# Patient Record
Sex: Female | Born: 1965
Health system: Southern US, Community
[De-identification: ages and names within clinical notes are randomized; demographics above are authoritative.]

## PROBLEM LIST (undated history)

## (undated) DIAGNOSIS — M549 Dorsalgia, unspecified: Secondary | ICD-10-CM

## (undated) DIAGNOSIS — C801 Malignant (primary) neoplasm, unspecified: Secondary | ICD-10-CM

## (undated) DIAGNOSIS — T7840XA Allergy, unspecified, initial encounter: Secondary | ICD-10-CM

## (undated) DIAGNOSIS — E785 Hyperlipidemia, unspecified: Secondary | ICD-10-CM

## (undated) DIAGNOSIS — S3992XA Unspecified injury of lower back, initial encounter: Secondary | ICD-10-CM

## (undated) DIAGNOSIS — K759 Inflammatory liver disease, unspecified: Secondary | ICD-10-CM

## (undated) DIAGNOSIS — F419 Anxiety disorder, unspecified: Secondary | ICD-10-CM

## (undated) DIAGNOSIS — E039 Hypothyroidism, unspecified: Secondary | ICD-10-CM

## (undated) DIAGNOSIS — Z87442 Personal history of urinary calculi: Secondary | ICD-10-CM

## (undated) DIAGNOSIS — E559 Vitamin D deficiency, unspecified: Secondary | ICD-10-CM

## (undated) DIAGNOSIS — K5900 Constipation, unspecified: Secondary | ICD-10-CM

## (undated) DIAGNOSIS — D649 Anemia, unspecified: Secondary | ICD-10-CM

## (undated) DIAGNOSIS — F329 Major depressive disorder, single episode, unspecified: Secondary | ICD-10-CM

## (undated) DIAGNOSIS — M1990 Unspecified osteoarthritis, unspecified site: Secondary | ICD-10-CM

## (undated) DIAGNOSIS — N979 Female infertility, unspecified: Secondary | ICD-10-CM

## (undated) DIAGNOSIS — M519 Unspecified thoracic, thoracolumbar and lumbosacral intervertebral disc disorder: Secondary | ICD-10-CM

## (undated) DIAGNOSIS — F32A Depression, unspecified: Secondary | ICD-10-CM

## (undated) DIAGNOSIS — K219 Gastro-esophageal reflux disease without esophagitis: Secondary | ICD-10-CM

## (undated) HISTORY — DX: Female infertility, unspecified: N97.9

## (undated) HISTORY — DX: Major depressive disorder, single episode, unspecified: F32.9

## (undated) HISTORY — DX: Vitamin D deficiency, unspecified: E55.9

## (undated) HISTORY — PX: DIAGNOSTIC LAPAROSCOPY: SUR761

## (undated) HISTORY — DX: Depression, unspecified: F32.A

## (undated) HISTORY — DX: Anxiety disorder, unspecified: F41.9

## (undated) HISTORY — DX: Dorsalgia, unspecified: M54.9

## (undated) HISTORY — PX: GANGLION CYST EXCISION: SHX1691

## (undated) HISTORY — DX: Hyperlipidemia, unspecified: E78.5

## (undated) HISTORY — DX: Allergy, unspecified, initial encounter: T78.40XA

## (undated) HISTORY — DX: Unspecified osteoarthritis, unspecified site: M19.90

## (undated) HISTORY — DX: Constipation, unspecified: K59.00

---

## 1985-10-17 HISTORY — PX: CERVICAL CONE BIOPSY: SUR198

## 1996-10-17 HISTORY — PX: LEEP: SHX91

## 1998-05-30 ENCOUNTER — Inpatient Hospital Stay (HOSPITAL_COMMUNITY): Admission: AD | Admit: 1998-05-30 | Discharge: 1998-05-30 | Payer: Self-pay | Admitting: Obstetrics and Gynecology

## 1999-02-08 ENCOUNTER — Ambulatory Visit (HOSPITAL_COMMUNITY): Admission: RE | Admit: 1999-02-08 | Discharge: 1999-02-08 | Payer: Self-pay | Admitting: Gastroenterology

## 1999-02-08 ENCOUNTER — Encounter: Payer: Self-pay | Admitting: Gastroenterology

## 2001-03-13 ENCOUNTER — Other Ambulatory Visit: Admission: RE | Admit: 2001-03-13 | Discharge: 2001-03-13 | Payer: Self-pay | Admitting: Obstetrics and Gynecology

## 2001-08-17 ENCOUNTER — Encounter: Payer: Self-pay | Admitting: Family Medicine

## 2001-08-17 ENCOUNTER — Ambulatory Visit (HOSPITAL_COMMUNITY): Admission: RE | Admit: 2001-08-17 | Discharge: 2001-08-17 | Payer: Self-pay | Admitting: Family Medicine

## 2002-10-17 DIAGNOSIS — K759 Inflammatory liver disease, unspecified: Secondary | ICD-10-CM

## 2002-10-17 HISTORY — DX: Inflammatory liver disease, unspecified: K75.9

## 2003-04-16 ENCOUNTER — Other Ambulatory Visit: Admission: RE | Admit: 2003-04-16 | Discharge: 2003-04-16 | Payer: Self-pay | Admitting: Obstetrics and Gynecology

## 2011-09-12 ENCOUNTER — Encounter: Payer: Self-pay | Admitting: *Deleted

## 2011-09-12 ENCOUNTER — Emergency Department (HOSPITAL_BASED_OUTPATIENT_CLINIC_OR_DEPARTMENT_OTHER)
Admission: EM | Admit: 2011-09-12 | Discharge: 2011-09-12 | Disposition: A | Discharge status: Home / Self Care | Payer: 59 | Attending: Emergency Medicine | Admitting: Emergency Medicine

## 2011-09-12 DIAGNOSIS — M5430 Sciatica, unspecified side: Secondary | ICD-10-CM | POA: Insufficient documentation

## 2011-09-12 DIAGNOSIS — M549 Dorsalgia, unspecified: Secondary | ICD-10-CM | POA: Insufficient documentation

## 2011-09-12 HISTORY — DX: Unspecified injury of lower back, initial encounter: S39.92XA

## 2011-09-12 MED ORDER — HYDROMORPHONE HCL PF 2 MG/ML IJ SOLN
2.0000 mg | Freq: Once | INTRAMUSCULAR | Status: AC
  Administered 2011-09-12: 2 mg via INTRAMUSCULAR
  Filled 2011-09-12: qty 1

## 2011-09-12 MED ORDER — METHYLPREDNISOLONE SODIUM SUCC 125 MG IJ SOLR
125.0000 mg | Freq: Once | INTRAMUSCULAR | Status: AC
  Administered 2011-09-12: 125 mg via INTRAMUSCULAR
  Filled 2011-09-12: qty 2

## 2011-09-12 MED ORDER — METHOCARBAMOL 500 MG PO TABS: 500.0000 mg | ORAL_TABLET | Freq: Two times a day (BID) | ORAL | Status: AC

## 2011-09-12 MED ORDER — ONDANSETRON HCL 4 MG/2ML IJ SOLN
4.0000 mg | Freq: Once | INTRAMUSCULAR | Status: AC
  Administered 2011-09-12: 4 mg via INTRAMUSCULAR
  Filled 2011-09-12: qty 2

## 2011-09-12 MED ORDER — OXYCODONE-ACETAMINOPHEN 5-325 MG PO TABS: 2.0000 | ORAL_TABLET | ORAL | Status: AC | PRN

## 2011-09-12 NOTE — ED Notes (Signed)
Pt reports lower back pain after acupuncture. No neuro def.

## 2011-09-12 NOTE — ED Provider Notes (Signed)
Medical screening examination/treatment/procedure(s) were performed by non-physician practitioner and as supervising physician I was immediately available for consultation/collaboration.   Kru Allman E Zauria Dombek, MD 09/12/11 2206 

## 2011-09-12 NOTE — ED Provider Notes (Signed)
History     CSN: [id number] Arrival date & time: 09/12/2011 11:43 AM   First MD Initiated Contact with Patient 09/12/11 1238      Chief Complaint  Patient presents with  . Back Pain    (Consider location/radiation/quality/duration/timing/severity/associated sxs/prior treatment) Patient is a 45 y.o. female presenting with back pain. The history is provided by the patient. No language interpreter was used.  Back Pain  This is a recurrent problem. The current episode started more than 2 days ago. The problem occurs constantly. The problem has been gradually worsening. The pain is associated with no known injury. The pain is present in the lumbar spine and gluteal region. The quality of the pain is described as stabbing and shooting. The pain radiates to the left thigh. The pain is at a severity of 7/10. The pain is severe. The symptoms are aggravated by certain positions. The pain is the same all the time. Stiffness is present all day. Pertinent negatives include no chest pain and no fever. She has tried ice and bed rest for the symptoms. The treatment provided no relief.  Pt has a history of back pain.  Pt is followed by Dr. Ethelene Hal.  Pt reports trouble getting out of bed today due to pain.  Past Medical History  Diagnosis Date  . Hypotension   . Back injury     No past surgical history on file.  No family history on file.  History  Substance Use Topics  . Smoking status: Not on file  . Smokeless tobacco: Not on file  . Alcohol Use: No    OB History    Grav Para Term Preterm Abortions TAB SAB Ect Mult Living                  Review of Systems  Constitutional: Negative for fever.  Cardiovascular: Negative for chest pain.  Musculoskeletal: Positive for back pain.  All other systems reviewed and are negative.    Allergies  Sulfa antibiotics  Home Medications   Current Outpatient Rx  Name Route Sig Dispense Refill  . CLONAZEPAM 0.5 MG PO TABS Oral Take 0.5 mg by  mouth 2 (two) times daily as needed.      . SERTRALINE HCL 100 MG PO TABS Oral Take 100 mg by mouth daily.      Marland Kitchen SIMVASTATIN 40 MG PO TABS Oral Take 40 mg by mouth at bedtime.        BP 128/76  Pulse 74  Temp(Src) 98.3 F (36.8 C) (Oral)  Resp 18  Ht 5\' 7"  (1.702 m)  Wt 195 lb (88.451 kg)  BMI 30.54 kg/m2  SpO2 100%  Physical Exam  Nursing note and vitals reviewed. Constitutional: She is oriented to person, place, and time. She appears well-developed and well-nourished.  HENT:  Head: Normocephalic and atraumatic.  Right Ear: External ear normal.  Mouth/Throat: Oropharynx is clear and moist.  Eyes: Conjunctivae and EOM are normal. Pupils are equal, round, and reactive to light.  Neck: Normal range of motion. Neck supple.  Cardiovascular: Normal rate and regular rhythm.   Pulmonary/Chest: Effort normal and breath sounds normal.  Abdominal: Soft. Bowel sounds are normal.  Musculoskeletal: Normal range of motion.  Neurological: She is alert and oriented to person, place, and time. She has normal reflexes.  Skin: Skin is warm and dry.  Psychiatric: She has a normal mood and affect.    ED Course  Procedures (including critical care time)  Labs Reviewed - No data to  display No results found.   No diagnosis found.    MDM   Pt counseled on followup.  Husband called Orthopaedist and they will see pt on Saturday. I will treat with solumedrol.  Pt given rx for percocet.        Langston Masker, [place] 09/12/11 1305  Langston Masker, [place] 09/12/11 1309  [place], [place] 09/12/11 1311

## 2011-09-12 NOTE — ED Notes (Signed)
Pt c/o cough and body aches "several days and it hurts my back worse when I cough". Pt sts low back pain is now bilateral and pain radiates down left leg.

## 2011-10-04 ENCOUNTER — Other Ambulatory Visit (HOSPITAL_COMMUNITY): Payer: Self-pay | Admitting: Physical Medicine and Rehabilitation

## 2011-10-04 DIAGNOSIS — M545 Low back pain: Secondary | ICD-10-CM

## 2011-10-06 ENCOUNTER — Ambulatory Visit (HOSPITAL_COMMUNITY)
Admission: RE | Admit: 2011-10-06 | Discharge: 2011-10-06 | Disposition: A | Discharge status: Home / Self Care | Payer: 59 | Source: Ambulatory Visit | Attending: Physical Medicine and Rehabilitation | Admitting: Physical Medicine and Rehabilitation

## 2011-10-06 DIAGNOSIS — M51379 Other intervertebral disc degeneration, lumbosacral region without mention of lumbar back pain or lower extremity pain: Secondary | ICD-10-CM | POA: Insufficient documentation

## 2011-10-06 DIAGNOSIS — M545 Low back pain: Secondary | ICD-10-CM

## 2011-10-06 DIAGNOSIS — M5126 Other intervertebral disc displacement, lumbar region: Secondary | ICD-10-CM | POA: Insufficient documentation

## 2011-10-06 DIAGNOSIS — M5137 Other intervertebral disc degeneration, lumbosacral region: Secondary | ICD-10-CM | POA: Insufficient documentation

## 2011-10-25 ENCOUNTER — Other Ambulatory Visit: Payer: Self-pay | Admitting: Neurosurgery

## 2011-10-26 ENCOUNTER — Encounter (HOSPITAL_COMMUNITY): Payer: Self-pay | Admitting: Pharmacy Technician

## 2011-11-01 ENCOUNTER — Encounter (HOSPITAL_COMMUNITY)
Admission: RE | Admit: 2011-11-01 | Discharge: 2011-11-01 | Disposition: A | Discharge status: Home / Self Care | Payer: 59 | Source: Ambulatory Visit | Attending: Neurosurgery | Admitting: Neurosurgery

## 2011-11-01 ENCOUNTER — Encounter (HOSPITAL_COMMUNITY): Payer: Self-pay

## 2011-11-01 HISTORY — DX: Unspecified thoracic, thoracolumbar and lumbosacral intervertebral disc disorder: M51.9

## 2011-11-01 LAB — CBC
Platelets: 203 10*3/uL (ref 150–400)
RBC: 4.56 MIL/uL (ref 3.87–5.11)
RDW: 14.6 % (ref 11.5–15.5)
WBC: 8.8 10*3/uL (ref 4.0–10.5)

## 2011-11-01 LAB — SURGICAL PCR SCREEN
MRSA, PCR: NEGATIVE
Staphylococcus aureus: NEGATIVE

## 2011-11-01 LAB — HCG, SERUM, QUALITATIVE: Preg, Serum: NEGATIVE

## 2011-11-01 NOTE — Pre-Procedure Instructions (Addendum)
19 Whitney Davenport  11/01/2011   Your procedure is scheduled on: JAN 17 Report to [facility] at 0845 AM.  Call this number if you have problems the morning of surgery: [phone]   Remember:   Do not eat food:After Midnight.  May have clear liquids: up to 4 Hours before arrival.  Clear liquids include soda, tea, black coffee, apple or grape juice, broth.  Take these medicines the morning of surgery with A SIP OF WATER: SERTRALINE ,CLONAZEPAM AS DESIRED.STOP ASPIRIN,COUMADIN ,EFFIENT,HERBAL MEDICATIONS Do not wear jewelry, make-up or nail polish.  Do not wear lotions, powders, or perfumes. You may wear deodorant.  Do not shave 48 hours prior to surgery.  Do not bring valuables to the hospital.  Contacts, dentures or bridgework may not be worn into surgery.  Leave suitcase in the car. After surgery it may be brought to your room.  For patients admitted to the hospital, checkout time is 11:00 AM the day of discharge.   Patients discharged the day of surgery will not be allowed to drive home.  Name and phone number of your driver: FAMILY  Special Instructions: CHG Shower Use Special Wash: 1/2 bottle night before surgery and 1/2 bottle morning of surgery.   Please read over the following fact sheets that you were given: Pain Booklet, MRSA Information and Surgical Site Infection Prevention

## 2011-11-02 MED ORDER — CEFAZOLIN SODIUM-DEXTROSE 2-3 GM-% IV SOLR
2.0000 g | INTRAVENOUS | Status: AC
  Administered 2011-11-03: 2 g via INTRAVENOUS
  Filled 2011-11-02: qty 50

## 2011-11-03 ENCOUNTER — Ambulatory Visit (HOSPITAL_COMMUNITY): Payer: 59 | Admitting: Certified Registered Nurse Anesthetist

## 2011-11-03 ENCOUNTER — Encounter (HOSPITAL_COMMUNITY): Payer: Self-pay | Admitting: Certified Registered Nurse Anesthetist

## 2011-11-03 ENCOUNTER — Ambulatory Visit (HOSPITAL_COMMUNITY)
Admission: RE | Admit: 2011-11-03 | Discharge: 2011-11-04 | Disposition: A | Discharge status: Home / Self Care | Payer: 59 | Source: Ambulatory Visit | Attending: Neurosurgery | Admitting: Neurosurgery

## 2011-11-03 ENCOUNTER — Encounter (HOSPITAL_COMMUNITY)
Admission: RE | Disposition: A | Discharge status: Home / Self Care | Payer: Self-pay | Source: Ambulatory Visit | Attending: Neurosurgery

## 2011-11-03 ENCOUNTER — Encounter (HOSPITAL_COMMUNITY): Payer: Self-pay | Admitting: *Deleted

## 2011-11-03 ENCOUNTER — Ambulatory Visit (HOSPITAL_COMMUNITY): Payer: 59

## 2011-11-03 DIAGNOSIS — M5126 Other intervertebral disc displacement, lumbar region: Secondary | ICD-10-CM | POA: Insufficient documentation

## 2011-11-03 DIAGNOSIS — M47817 Spondylosis without myelopathy or radiculopathy, lumbosacral region: Secondary | ICD-10-CM | POA: Insufficient documentation

## 2011-11-03 DIAGNOSIS — M51379 Other intervertebral disc degeneration, lumbosacral region without mention of lumbar back pain or lower extremity pain: Secondary | ICD-10-CM | POA: Insufficient documentation

## 2011-11-03 DIAGNOSIS — Z87442 Personal history of urinary calculi: Secondary | ICD-10-CM | POA: Insufficient documentation

## 2011-11-03 DIAGNOSIS — Z01812 Encounter for preprocedural laboratory examination: Secondary | ICD-10-CM | POA: Insufficient documentation

## 2011-11-03 DIAGNOSIS — N189 Chronic kidney disease, unspecified: Secondary | ICD-10-CM | POA: Insufficient documentation

## 2011-11-03 DIAGNOSIS — M5137 Other intervertebral disc degeneration, lumbosacral region: Secondary | ICD-10-CM | POA: Insufficient documentation

## 2011-11-03 HISTORY — PX: LUMBAR LAMINECTOMY/DECOMPRESSION MICRODISCECTOMY: SHX5026

## 2011-11-03 SURGERY — LUMBAR LAMINECTOMY/DECOMPRESSION MICRODISCECTOMY
Anesthesia: General | Site: Back | Laterality: Left | Wound class: Clean

## 2011-11-03 MED ORDER — ROCURONIUM BROMIDE 100 MG/10ML IV SOLN
INTRAVENOUS | Status: DC | PRN
  Administered 2011-11-03: 50 mg via INTRAVENOUS

## 2011-11-03 MED ORDER — CYCLOBENZAPRINE HCL 10 MG PO TABS
10.0000 mg | ORAL_TABLET | Freq: Three times a day (TID) | ORAL | Status: DC | PRN
  Administered 2011-11-03: 10 mg via ORAL
  Filled 2011-11-03: qty 1

## 2011-11-03 MED ORDER — KETOROLAC TROMETHAMINE 30 MG/ML IJ SOLN
30.0000 mg | Freq: Once | INTRAMUSCULAR | Status: AC
  Administered 2011-11-03: 30 mg via INTRAVENOUS

## 2011-11-03 MED ORDER — SODIUM CHLORIDE 0.9 % IJ SOLN
3.0000 mL | Freq: Two times a day (BID) | INTRAMUSCULAR | Status: DC
  Administered 2011-11-03 (×2): 3 mL via INTRAVENOUS

## 2011-11-03 MED ORDER — PHENOL 1.4 % MT LIQD: 1.0000 | OROMUCOSAL | Status: DC | PRN

## 2011-11-03 MED ORDER — HYDROXYZINE HCL 50 MG/ML IM SOLN: 50.0000 mg | INTRAMUSCULAR | Status: DC | PRN

## 2011-11-03 MED ORDER — OXYCODONE-ACETAMINOPHEN 5-325 MG PO TABS
1.0000 | ORAL_TABLET | ORAL | Status: DC | PRN
  Administered 2011-11-03 – 2011-11-04 (×4): 2 via ORAL
  Filled 2011-11-03 (×4): qty 2

## 2011-11-03 MED ORDER — GLYCOPYRROLATE 0.2 MG/ML IJ SOLN
INTRAMUSCULAR | Status: DC | PRN
  Administered 2011-11-03: .9 mg via INTRAVENOUS

## 2011-11-03 MED ORDER — SODIUM CHLORIDE 0.9 % IV SOLN
INTRAVENOUS | Status: AC
  Filled 2011-11-03: qty 500

## 2011-11-03 MED ORDER — HEMOSTATIC AGENTS (NO CHARGE) OPTIME
TOPICAL | Status: DC | PRN
  Administered 2011-11-03: 1 via TOPICAL

## 2011-11-03 MED ORDER — MEPERIDINE HCL 50 MG/ML IJ SOLN
INTRAMUSCULAR | Status: AC
  Filled 2011-11-03: qty 1

## 2011-11-03 MED ORDER — ALUM & MAG HYDROXIDE-SIMETH 200-200-20 MG/5ML PO SUSP: 30.0000 mL | Freq: Four times a day (QID) | ORAL | Status: DC | PRN

## 2011-11-03 MED ORDER — THROMBIN 5000 UNITS EX KIT
PACK | CUTANEOUS | Status: DC | PRN
  Administered 2011-11-03 (×2): 5000 [IU] via TOPICAL

## 2011-11-03 MED ORDER — FENTANYL CITRATE 0.05 MG/ML IJ SOLN
INTRAMUSCULAR | Status: AC
  Filled 2011-11-03: qty 2

## 2011-11-03 MED ORDER — SODIUM CHLORIDE 0.9 % IJ SOLN: 3.0000 mL | INTRAMUSCULAR | Status: DC | PRN

## 2011-11-03 MED ORDER — MIDAZOLAM HCL 5 MG/5ML IJ SOLN
INTRAMUSCULAR | Status: DC | PRN
  Administered 2011-11-03: 2 mg via INTRAVENOUS

## 2011-11-03 MED ORDER — ONDANSETRON HCL 4 MG/2ML IJ SOLN: 4.0000 mg | Freq: Four times a day (QID) | INTRAMUSCULAR | Status: DC | PRN

## 2011-11-03 MED ORDER — HYDROCODONE-ACETAMINOPHEN 5-325 MG PO TABS: 1.0000 | ORAL_TABLET | ORAL | Status: DC | PRN

## 2011-11-03 MED ORDER — NEOSTIGMINE METHYLSULFATE 1 MG/ML IJ SOLN
INTRAMUSCULAR | Status: DC | PRN
  Administered 2011-11-03: 5 mg via INTRAVENOUS

## 2011-11-03 MED ORDER — ACETAMINOPHEN 650 MG RE SUPP: 650.0000 mg | RECTAL | Status: DC | PRN

## 2011-11-03 MED ORDER — 0.9 % SODIUM CHLORIDE (POUR BTL) OPTIME
TOPICAL | Status: DC | PRN
  Administered 2011-11-03: 1000 mL

## 2011-11-03 MED ORDER — KCL IN DEXTROSE-NACL 20-5-0.45 MEQ/L-%-% IV SOLN
INTRAVENOUS | Status: DC
  Administered 2011-11-03: 17:00:00 via INTRAVENOUS
  Filled 2011-11-03 (×4): qty 1000

## 2011-11-03 MED ORDER — ACETAMINOPHEN 325 MG PO TABS: 650.0000 mg | ORAL_TABLET | ORAL | Status: DC | PRN

## 2011-11-03 MED ORDER — FENTANYL CITRATE 0.05 MG/ML IJ SOLN
INTRAMUSCULAR | Status: DC | PRN
  Administered 2011-11-03: 150 ug via INTRAVENOUS
  Administered 2011-11-03: 100 ug via INTRAVENOUS
  Administered 2011-11-03: 50 ug via INTRAVENOUS

## 2011-11-03 MED ORDER — ZOLPIDEM TARTRATE 5 MG PO TABS: 10.0000 mg | ORAL_TABLET | Freq: Every evening | ORAL | Status: DC | PRN

## 2011-11-03 MED ORDER — HYDROMORPHONE HCL PF 1 MG/ML IJ SOLN
INTRAMUSCULAR | Status: AC
  Filled 2011-11-03: qty 1

## 2011-11-03 MED ORDER — METHYLPREDNISOLONE ACETATE PF 80 MG/ML IJ SUSP
INTRAMUSCULAR | Status: DC | PRN
  Administered 2011-11-03: 80 mg

## 2011-11-03 MED ORDER — MEPERIDINE HCL 25 MG/ML IJ SOLN
INTRAMUSCULAR | Status: DC | PRN
Start: 2011-11-03 — End: 2011-11-03
  Administered 2011-11-03: 25 mg via INTRAVENOUS

## 2011-11-03 MED ORDER — LIDOCAINE-EPINEPHRINE 1 %-1:100000 IJ SOLN
INTRAMUSCULAR | Status: DC | PRN
  Administered 2011-11-03: 7.5 mL

## 2011-11-03 MED ORDER — MAGNESIUM HYDROXIDE 400 MG/5ML PO SUSP: 30.0000 mL | Freq: Every day | ORAL | Status: DC | PRN

## 2011-11-03 MED ORDER — ONDANSETRON HCL 4 MG/2ML IJ SOLN
INTRAMUSCULAR | Status: DC | PRN
  Administered 2011-11-03: 4 mg via INTRAVENOUS

## 2011-11-03 MED ORDER — BISACODYL 10 MG RE SUPP: 10.0000 mg | Freq: Every day | RECTAL | Status: DC | PRN

## 2011-11-03 MED ORDER — SODIUM CHLORIDE 0.9 % IV SOLN: 250.0000 mL | INTRAVENOUS | Status: DC

## 2011-11-03 MED ORDER — KETOROLAC TROMETHAMINE 30 MG/ML IJ SOLN
INTRAMUSCULAR | Status: AC
  Filled 2011-11-03: qty 1

## 2011-11-03 MED ORDER — KETOROLAC TROMETHAMINE 30 MG/ML IJ SOLN
30.0000 mg | Freq: Four times a day (QID) | INTRAMUSCULAR | Status: DC
  Administered 2011-11-03 – 2011-11-04 (×3): 30 mg via INTRAVENOUS
  Filled 2011-11-03 (×7): qty 1

## 2011-11-03 MED ORDER — HYDROXYZINE HCL 25 MG PO TABS: 50.0000 mg | ORAL_TABLET | ORAL | Status: DC | PRN

## 2011-11-03 MED ORDER — LACTATED RINGERS IV SOLN
INTRAVENOUS | Status: DC | PRN
  Administered 2011-11-03 (×2): via INTRAVENOUS

## 2011-11-03 MED ORDER — HYDROMORPHONE HCL PF 1 MG/ML IJ SOLN
0.2500 mg | INTRAMUSCULAR | Status: DC | PRN
  Administered 2011-11-03: 0.25 mg via INTRAVENOUS

## 2011-11-03 MED ORDER — PROPOFOL 10 MG/ML IV BOLUS
INTRAVENOUS | Status: DC | PRN
  Administered 2011-11-03: 180 mg via INTRAVENOUS

## 2011-11-03 MED ORDER — BUPIVACAINE HCL (PF) 0.5 % IJ SOLN
INTRAMUSCULAR | Status: DC | PRN
  Administered 2011-11-03: 7.5 mL

## 2011-11-03 MED ORDER — MENTHOL 3 MG MT LOZG: 1.0000 | LOZENGE | OROMUCOSAL | Status: DC | PRN

## 2011-11-03 MED ORDER — SODIUM CHLORIDE 0.9 % IR SOLN
Status: DC | PRN
  Administered 2011-11-03: 11:00:00

## 2011-11-03 MED ORDER — MORPHINE SULFATE 4 MG/ML IJ SOLN
4.0000 mg | INTRAMUSCULAR | Status: DC | PRN
  Administered 2011-11-03: 4 mg via INTRAMUSCULAR
  Filled 2011-11-03: qty 1

## 2011-11-03 MED ORDER — BACITRACIN 50000 UNITS IM SOLR
INTRAMUSCULAR | Status: AC
  Filled 2011-11-03: qty 1

## 2011-11-03 SURGICAL SUPPLY — 59 items
BAG DECANTER FOR FLEXI CONT (MISCELLANEOUS) ×2 IMPLANT
BENZOIN TINCTURE PRP APPL 2/3 (GAUZE/BANDAGES/DRESSINGS) IMPLANT
BLADE SURG ROTATE 9660 (MISCELLANEOUS) IMPLANT
BRUSH SCRUB EZ PLAIN DRY (MISCELLANEOUS) ×2 IMPLANT
BUR ACORN 6.0 ACORN (BURR) IMPLANT
BUR ACRON 5.0MM COATED (BURR) IMPLANT
BUR MATCHSTICK NEURO 3.0 LAGG (BURR) ×2 IMPLANT
CANISTER SUCTION 2500CC (MISCELLANEOUS) ×2 IMPLANT
CLOTH BEACON ORANGE TIMEOUT ST (SAFETY) ×2 IMPLANT
CONT SPEC 4OZ CLIKSEAL STRL BL (MISCELLANEOUS) IMPLANT
DERMABOND ADVANCED (GAUZE/BANDAGES/DRESSINGS) ×2
DERMABOND ADVANCED .7 DNX12 (GAUZE/BANDAGES/DRESSINGS) ×2 IMPLANT
DRAPE LAPAROTOMY 100X72X124 (DRAPES) ×2 IMPLANT
DRAPE MICROSCOPE LEICA (MISCELLANEOUS) ×2 IMPLANT
DRAPE POUCH INSTRU U-SHP 10X18 (DRAPES) ×2 IMPLANT
DRSG EMULSION OIL 3X3 NADH (GAUZE/BANDAGES/DRESSINGS) IMPLANT
ELECT REM PT RETURN 9FT ADLT (ELECTROSURGICAL) ×2
ELECTRODE REM PT RTRN 9FT ADLT (ELECTROSURGICAL) ×1 IMPLANT
GAUZE SPONGE 4X4 16PLY XRAY LF (GAUZE/BANDAGES/DRESSINGS) IMPLANT
GLOVE BIOGEL PI IND STRL 6.5 (GLOVE) ×1 IMPLANT
GLOVE BIOGEL PI IND STRL 7.0 (GLOVE) ×1 IMPLANT
GLOVE BIOGEL PI IND STRL 8 (GLOVE) ×1 IMPLANT
GLOVE BIOGEL PI INDICATOR 6.5 (GLOVE) ×1
GLOVE BIOGEL PI INDICATOR 7.0 (GLOVE) ×1
GLOVE BIOGEL PI INDICATOR 8 (GLOVE) ×1
GLOVE ECLIPSE 7.5 STRL STRAW (GLOVE) ×4 IMPLANT
GLOVE EXAM NITRILE LRG STRL (GLOVE) IMPLANT
GLOVE EXAM NITRILE MD LF STRL (GLOVE) ×2 IMPLANT
GLOVE EXAM NITRILE XL STR (GLOVE) IMPLANT
GLOVE EXAM NITRILE XS STR PU (GLOVE) IMPLANT
GLOVE SURG SS PI 6.5 STRL IVOR (GLOVE) ×4 IMPLANT
GLOVE SURG SS PI 7.5 STRL IVOR (GLOVE) ×4 IMPLANT
GOWN BRE IMP SLV AUR LG STRL (GOWN DISPOSABLE) ×6 IMPLANT
GOWN BRE IMP SLV AUR XL STRL (GOWN DISPOSABLE) ×2 IMPLANT
GOWN STRL REIN 2XL LVL4 (GOWN DISPOSABLE) IMPLANT
KIT BASIN OR (CUSTOM PROCEDURE TRAY) ×2 IMPLANT
KIT ROOM TURNOVER OR (KITS) ×2 IMPLANT
NEEDLE HYPO 18GX1.5 BLUNT FILL (NEEDLE) IMPLANT
NEEDLE SPNL 18GX3.5 QUINCKE PK (NEEDLE) ×2 IMPLANT
NEEDLE SPNL 22GX3.5 QUINCKE BK (NEEDLE) ×2 IMPLANT
NS IRRIG 1000ML POUR BTL (IV SOLUTION) ×2 IMPLANT
PACK LAMINECTOMY NEURO (CUSTOM PROCEDURE TRAY) ×2 IMPLANT
PAD ARMBOARD 7.5X6 YLW CONV (MISCELLANEOUS) ×6 IMPLANT
PATTIES SURGICAL .5 X1 (DISPOSABLE) IMPLANT
RUBBERBAND STERILE (MISCELLANEOUS) ×4 IMPLANT
SPONGE GAUZE 4X4 12PLY (GAUZE/BANDAGES/DRESSINGS) IMPLANT
SPONGE LAP 4X18 X RAY DECT (DISPOSABLE) IMPLANT
SPONGE SURGIFOAM ABS GEL SZ50 (HEMOSTASIS) ×2 IMPLANT
STRIP CLOSURE SKIN 1/2X4 (GAUZE/BANDAGES/DRESSINGS) IMPLANT
SUT PROLENE 6 0 BV (SUTURE) IMPLANT
SUT VIC AB 1 CT1 18XBRD ANBCTR (SUTURE) ×1 IMPLANT
SUT VIC AB 1 CT1 8-18 (SUTURE) ×1
SUT VIC AB 2-0 CP2 18 (SUTURE) ×2 IMPLANT
SUT VIC AB 3-0 SH 8-18 (SUTURE) IMPLANT
SYR 20CC LL (SYRINGE) ×2 IMPLANT
SYR 5ML LL (SYRINGE) IMPLANT
TOWEL OR 17X24 6PK STRL BLUE (TOWEL DISPOSABLE) ×2 IMPLANT
TOWEL OR 17X26 10 PK STRL BLUE (TOWEL DISPOSABLE) ×2 IMPLANT
WATER STERILE IRR 1000ML POUR (IV SOLUTION) ×2 IMPLANT

## 2011-11-03 NOTE — H&P (Signed)
Subjective: Patient is a 46 y.o. female who is admitted for treatment of left lumbar radiculopathy secondary to left L5-S1 lumbar disc herniation.patient explains her symptoms began last spring with pain for the low back down to the left buttock posterior thigh and into the calf and foot with numbness and tingling in a similar distribution. She's been treated with a posterior injections I am steroids and narcotic analgesics but without any lasting relief.  MRI scan reveals a large left L5-S1 lumbar disc herniation superimposed upon degenerative disease and spondylosis.she is admitted now for a left L5-S1 lumbar laminotomy and microdiscectomy.   Past Medical History  Diagnosis Date  . Hypotension   . Back injury   . Chronic kidney disease   . Kidney stone   . Lumbar disc disease     Past Surgical History  Procedure Date  . Diagnostic laparoscopy     for endometriosos  . Ganglion cyst excision     Prescriptions prior to admission  Medication Sig Dispense Refill  . clonazePAM (KLONOPIN) 0.5 MG tablet Take 0.5 mg by mouth 2 (two) times daily as needed.       . sertraline (ZOLOFT) 100 MG tablet Take 100 mg by mouth daily. Take 1 1/2 tablets daily      . simvastatin (ZOCOR) 40 MG tablet Take 40 mg by mouth at bedtime.        Allergies  Allergen Reactions  . Sulfa Antibiotics Nausea And Vomiting and Other (See Comments)    Causes headache with nausea  . Zithromax (Azithromycin) Itching and Rash    History  Substance Use Topics  . Smoking status: Former Smoker -- 15 years    Types: Cigarettes    Quit date: 10/31/2010  . Smokeless tobacco: Not on file  . Alcohol Use: No    History reviewed. No pertinent family history.   Review of Systems A comprehensive review of systems was negative.  Objective: Vital signs in last 24 hours: Temp:  [97.9 F (36.6 C)] 97.9 F (36.6 C) (01/17 0838) Pulse Rate:  [94] 94  (01/17 0838) Resp:  [20] 20  (01/17 0838) BP: (111)/(79) 111/79 mmHg  (01/17 0838) SpO2:  [98 %] 98 % (01/17 0838)  EXAM:  Lungs are clear to auscultation , the patient has symmetrical respiratory excursion. Heart has a regular rate and rhythm normal S1 and S2 no murmur.   Abdomen is soft nontender nondistended bowel sounds are present. Extremity examination shows no clubbing cyanosis or edema. Musculoskeletal examination shows no tenderness to palpation over the lumbar spinous processes or paralumbar musculature. However she has significant limitation of forward flexion at 45, limited by pain. She is able to extend to 10. Straight leg raising is positive on the left at 60. Straight leg raising is negative on the right.  Motor examination shows 5 over 5 strength in the lower extremities including the iliopsoas quadriceps dorsiflexor extensor hallicus  longus and plantar flexor bilaterally. Sensation is intact to pinprick in the distal lower extremities. Reflexes are symmetrical bilaterally at the quadriceps, but the left gastrocnemius is minimal and the right gastrocnemius is 2.. No pathologic reflexes are present. Patient has a normal gait and stance.   Data Review:CBC    Component Value Date/Time   WBC 8.8 11/01/2011 1412   RBC 4.56 11/01/2011 1412   HGB 12.7 11/01/2011 1412   HCT 39.8 11/01/2011 1412   PLT 203 11/01/2011 1412   MCV 87.3 11/01/2011 1412   MCH 27.9 11/01/2011 1412   MCHC  31.9 11/01/2011 1412   RDW 14.6 11/01/2011 1412    Assessment/Plan: Left L5-S1 lumbar disc herniation superimposed upon degenerative disc disease and spondylosis, with resulting left lumbar radiculopathy.patient is admitted for a left L5-S1 lumbar laminotomy and microdiscectomy. I've discussed with the patient the nature of his condition, the nature the surgical procedure, the typical length of surgery, hospital stay, and overall recuperation. We discussed limitations postoperatively. I discussed risks of surgery including risks of infection, bleeding, possibly need for transfusion,  the risk of nerve root dysfunction with pain, weakness, numbness, or paresthesias, or risk of dural tear and CSF leakage and possible need for further surgery, the risk of recurrent disc herniation and the possible need for further surgery, and the risk of anesthetic complications including myocardial infarction, stroke, pneumonia, and death. Understanding all this the patient does wish to proceed with surgery and is admitted for such.    Hewitt Shorts, MD 11/03/2011 9:44 AM

## 2011-11-03 NOTE — Anesthesia Postprocedure Evaluation (Signed)
Anesthesia Post Note  Patient: Whitney Davenport  Procedure(s) Performed:  LUMBAR LAMINECTOMY/DECOMPRESSION MICRODISCECTOMY - LEFT Lumbar five sacral one laminotomy and microdiskectomy  Anesthesia type: General  Patient location: PACU  Post pain: Pain level controlled and Adequate analgesia  Post assessment: Post-op Vital signs reviewed, Patient's Cardiovascular Status Stable, Respiratory Function Stable, Patent Airway and Pain level controlled  Last Vitals:  Filed Vitals:   11/03/11 1245  BP: 115/66  Pulse: 71  Temp:   Resp: 10    Post vital signs: Reviewed and stable  Level of consciousness: awake, alert  and oriented  Complications: No apparent anesthesia complications

## 2011-11-03 NOTE — Op Note (Signed)
11/03/2011  12:10 PM  PATIENT:  Whitney Davenport  47 y.o. female  PRE-OPERATIVE DIAGNOSIS:  Left lumbar five sacral one herniated disc, lumbar degerative disc disease, lumbar spondylosis, lumbar radiculopathy  POST-OPERATIVE DIAGNOSIS:  Left lumbar five sacral one herniated disc, lumbar degerative disc disease, lumbar spondylosis, lumbar radiculopathy  PROCEDURE:  Procedure(s): LUMBAR LAMINECTOMY/DECOMPRESSION MICRODISCECTOMY, left L5-S1 lumbar laminotomy and microdiscectomy with microdissection, microsurgical technique, and the operating microscope  SURGEON:  Surgeon(s): Hewitt Shorts, MD Clydene Fake, MD  ASSISTANTS: Clydene Fake, M.D.  ANESTHESIA:   general  EBL:  Total I/O In: 1000 [I.V.:1000] Out: -   BLOOD ADMINISTERED:none  COUNT: Correct per nursing staff about the  DRAINS: none   SPECIMEN:  No Specimen  DICTATION: Patient was brought to the operating room and placed under general endotracheal anesthesia. Patient was turned to prone position the lumbar region was prepped with Betadine soap and solution and draped in a sterile fashion. The midline was infiltrated with local anesthetic with epinephrine. A localizing x-ray was taken and the L5-S1 level was identified. Midline incision was made over the L5-S1 level and was carried down through the subcutaneous tissue to the lumbar fascia. The lumbar fascia was incised on the left side and the paraspinal muscles were dissected from the spinous processes and lamina in a subperiosteal fashion. Another x-ray was taken and the left L5-S1 intralaminar space was identified. Laminotomy was performed using the high-speed drill and Kerrison punches. The ligamentum flavum was carefully resected. The underlying thecal sac and nerve root were identified. The disc herniation was identified and the thecal sac and nerve root gently retracted medially. We incised the remaining annular fibers and removed several large fragments that had  herniated, and then entered the disc space and proceeded with a thorough discectomy, using a variety of pituitary rongeurs and micro-curettes. All loose fragments of disc material were removed from the epidural space and the disc space. Once the discectomy was completed and good decompression of the thecal sac and nerve had been achieved hemostasis was established with the use of bipolar cautery and Gelfoam with thrombin. The Gelfoam was removed and hemostasis confirmed. We then instilled 2 cc of fentanyl and 80 mg of Depo-Medrol into the epidural space. Deep fascia was closed with interrupted undyed 1 Vicryl sutures. Scarpa's fascia was closed with interrupted undyed 1 Vicryl sutures in the subcutaneous and subcuticular layer were closed with interrupted inverted 2-0 undyed Vicryl sutures. The skin edges were approximated with Dermabond. Following surgery the patient was turned back to a supine position to be reversed from the anesthetic extubated and transferred to the recovery room for further care.  PLAN OF CARE: Admit to inpatient   PATIENT DISPOSITION:  PACU - hemodynamically stable.   Delay start of Pharmacological VTE agent (>24hrs) due to surgical blood loss or risk of bleeding:  yes

## 2011-11-03 NOTE — Anesthesia Procedure Notes (Signed)
Procedure Name: Intubation Date/Time: 11/03/2011 10:40 AM Performed by: Delbert Harness Pre-anesthesia Checklist: Patient identified, Timeout performed, Emergency Drugs available, Suction available and Patient being monitored Patient Re-evaluated:Patient Re-evaluated prior to inductionOxygen Delivery Method: Circle System Utilized Preoxygenation: Pre-oxygenation with 100% oxygen Intubation Type: IV induction Ventilation: Mask ventilation without difficulty Laryngoscope Size: Mac and 3 Grade View: Grade I Tube type: Oral Tube size: 7.5 mm Number of attempts: 1 Airway Equipment and Method: stylet Placement Confirmation: ETT inserted through vocal cords under direct vision,  positive ETCO2 and breath sounds checked- equal and bilateral Secured at: 22 cm Tube secured with: Tape Dental Injury: Teeth and Oropharynx as per pre-operative assessment

## 2011-11-03 NOTE — Anesthesia Preprocedure Evaluation (Addendum)
Anesthesia Evaluation  Patient identified by MRN, date of birth, ID band Patient awake    Reviewed: Allergy & Precautions, H&P , NPO status , Patient's Chart, lab work & pertinent test results  Airway Mallampati: II TM Distance: >3 FB Neck ROM: full    Dental  (+) Teeth Intact and Dental Advidsory Given   Pulmonary neg pulmonary ROS,    Pulmonary exam normal       Cardiovascular neg cardio ROS     Neuro/Psych    GI/Hepatic negative GI ROS, Neg liver ROS,   Endo/Other  Negative Endocrine ROS  Renal/GU negative Renal ROS     Musculoskeletal   Abdominal Normal abdominal exam  (+)   Peds  Hematology negative hematology ROS (+)   Anesthesia Other Findings   Reproductive/Obstetrics negative OB ROS                          Anesthesia Physical Anesthesia Plan  ASA: II  Anesthesia Plan: General   Post-op Pain Management:    Induction: Intravenous  Airway Management Planned: Oral ETT  Additional Equipment:   Intra-op Plan:   Post-operative Plan: Extubation in OR  Informed Consent: I have reviewed the patients History and Physical, chart, labs and discussed the procedure including the risks, benefits and alternatives for the proposed anesthesia with the patient or authorized representative who has indicated his/her understanding and acceptance.     Plan Discussed with: CRNA and Surgeon  Anesthesia Plan Comments:         Anesthesia Quick Evaluation

## 2011-11-03 NOTE — Progress Notes (Signed)
Filed Vitals:   11/03/11 1415 11/03/11 1430 11/03/11 1516 11/03/11 1728  BP: 102/59 106/55 107/75 129/72  Pulse: 68 70 63 66  Temp:  98.4 F (36.9 C) 98.3 F (36.8 C) 97.7 F (36.5 C)  TempSrc:   Oral Oral  Resp: 12 6 18 20   SpO2: 94% 93% 90% 99%    CBC  Basename 11/01/11 1412  WBC 8.8  HGB 12.7  HCT 39.8  PLT 203    Patient resting comfortably in bed, mild incisional discomfort, excellent relief of lumbar radicular pain. Patient has voided. Patient is ambulate in the halls. Wound is clean and dry.  Plan: Encouraged to ambulate several times this evening.

## 2011-11-03 NOTE — Transfer of Care (Signed)
Immediate Anesthesia Transfer of Care Note  Patient: Whitney Davenport  Procedure(s) Performed:  LUMBAR LAMINECTOMY/DECOMPRESSION MICRODISCECTOMY - LEFT Lumbar five sacral one laminotomy and microdiskectomy  Patient Location: PACU  Anesthesia Type: General  Level of Consciousness: awake, alert  and oriented  Airway & Oxygen Therapy: Patient Spontanous Breathing and Patient connected to nasal cannula oxygen  Post-op Assessment: Report given to PACU RN, Post -op Vital signs reviewed and stable, Patient moving all extremities X 4 and Patient able to stick tongue midline  Post vital signs: Reviewed and stable Filed Vitals:   11/03/11 1230  BP:   Pulse: 87  Temp:   Resp: 11    Complications: No apparent anesthesia complications

## 2011-11-03 NOTE — Preoperative (Signed)
Beta Blockers   Reason not to administer Beta Blockers:Not Applicable 

## 2011-11-04 MED ORDER — OXYCODONE-ACETAMINOPHEN 5-325 MG PO TABS: 1.0000 | ORAL_TABLET | ORAL | Status: AC | PRN

## 2011-11-04 MED ORDER — CYCLOBENZAPRINE HCL 10 MG PO TABS: 10.0000 mg | ORAL_TABLET | Freq: Three times a day (TID) | ORAL | Status: AC | PRN

## 2011-11-04 NOTE — Discharge Summary (Signed)
Physician Discharge Summary  Patient ID: Whitney Davenport MRN: [id number] DOB/AGE: August 03, 1966 46 y.o.  Admit date: 11/03/2011 Discharge date: 11/04/2011  Admission Diagnoses:Left lumbar five sacral one herniated disc, lumbar degerative disc disease, lumbar spondylosis, lumbar radiculopathy  Discharge Diagnoses: Left lumbar five sacral one herniated disc, lumbar degerative disc disease, lumbar spondylosis, lumbar radiculopathy  Discharged Condition: good  Hospital Course: Patient was admitted underwent a left L5-S1 lumbar laminotomy and microdiscectomy. Postoperatively she has had excellent relief of her radicular pain. She been up and ambulate actively in the halls. The wound is clean and dry. She's been given instructions regarding wound care and activities following discharge. She is to return for followup with me in the office in 3 weeks  Discharge Exam: Blood pressure 102/63, pulse 70, temperature 98 F (36.7 C), temperature source Oral, resp. rate 18, SpO2 93.00%.  Disposition: Home   Current Discharge Medication List    START taking these medications   Details  cyclobenzaprine (FLEXERIL) 10 MG tablet Take 1 tablet (10 mg total) by mouth 3 (three) times daily as needed for muscle spasms. Qty: 30 tablet, Refills: 1    oxyCODONE-acetaminophen (PERCOCET) 5-325 MG per tablet Take 1-2 tablets by mouth every 4 (four) hours as needed. Qty: 50 tablet, Refills: 0      CONTINUE these medications which have NOT CHANGED   Details  clonazePAM (KLONOPIN) 0.5 MG tablet Take 0.5 mg by mouth 2 (two) times daily as needed.     sertraline (ZOLOFT) 100 MG tablet Take 100 mg by mouth daily. Take 1 1/2 tablets daily    simvastatin (ZOCOR) 40 MG tablet Take 40 mg by mouth at bedtime.          Signed: Hewitt Shorts, MD 11/04/2011, 8:16 AM

## 2011-11-08 ENCOUNTER — Encounter (HOSPITAL_COMMUNITY): Payer: Self-pay | Admitting: Neurosurgery

## 2014-04-25 ENCOUNTER — Other Ambulatory Visit (HOSPITAL_BASED_OUTPATIENT_CLINIC_OR_DEPARTMENT_OTHER): Payer: Self-pay | Admitting: Physical Medicine and Rehabilitation

## 2014-04-25 DIAGNOSIS — M545 Low back pain: Secondary | ICD-10-CM

## 2014-04-26 ENCOUNTER — Ambulatory Visit (HOSPITAL_BASED_OUTPATIENT_CLINIC_OR_DEPARTMENT_OTHER)
Admission: RE | Admit: 2014-04-26 | Discharge: 2014-04-26 | Disposition: A | Discharge status: Home / Self Care | Payer: 59 | Source: Ambulatory Visit | Attending: Physical Medicine and Rehabilitation | Admitting: Physical Medicine and Rehabilitation

## 2014-04-26 DIAGNOSIS — M79609 Pain in unspecified limb: Secondary | ICD-10-CM | POA: Insufficient documentation

## 2014-04-26 DIAGNOSIS — M545 Low back pain, unspecified: Secondary | ICD-10-CM | POA: Insufficient documentation

## 2014-04-26 DIAGNOSIS — M5126 Other intervertebral disc displacement, lumbar region: Secondary | ICD-10-CM | POA: Insufficient documentation

## 2014-04-26 MED ORDER — GADOBENATE DIMEGLUMINE 529 MG/ML IV SOLN: 18.0000 mL | Freq: Once | INTRAVENOUS | Status: AC | PRN

## 2014-10-24 ENCOUNTER — Ambulatory Visit (INDEPENDENT_AMBULATORY_CARE_PROVIDER_SITE_OTHER): Payer: 59 | Admitting: Family Medicine

## 2014-10-24 VITALS — BP 126/82 | HR 91 | Temp 98.2°F | Resp 17 | Ht 67.0 in | Wt 206.0 lb

## 2014-10-24 DIAGNOSIS — Z0000 Encounter for general adult medical examination without abnormal findings: Secondary | ICD-10-CM

## 2014-10-24 NOTE — Patient Instructions (Signed)
Get the TB skin test signed by your other physician  Return if problems arise  [employer], Johney [place]

## 2014-10-24 NOTE — Progress Notes (Signed)
Physical examination:  History: Patient is here for physical examination. She has no major acute medical complaints. She does have a primary care doctor. 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 She is in school and [employer], and pre-social work type course.  Past medical history: Surgeries: None Medical illnesses: None except depression and anxiety and hyperlipidemia. Did have problems with infertility. Medications: Sertraline, clonazepam, and simvastatin Allergies: Sulfa and azithromycin  Family history: Parents are living. Some family history of diabetes.  Social history: Married. 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 She has one 69 year old adopted child.  Review of systems: Major of systems was unremarkable.  Physical examination: Well-developed well-nourished lady in no acute distress. HEENT: TMs normal. Eyes PERRLA. Fundi benign. Her throat is clear. Neck supple without nodes or thyromegaly. Chest is clear to auscultation. Heart regular without murmurs gallops or arrhythmias. Abdomen soft without mass or tenderness. Extremities unremarkable. Skin unremarkable. Pelvic and breast exam not done, gets those on regular basis.  Assessment: Normal annual physical examination  Plan: Forms completed. See copy.

## 2014-10-28 ENCOUNTER — Inpatient Hospital Stay: Admit: 2014-10-28 | Payer: Self-pay | Admitting: Orthopedic Surgery

## 2014-10-28 SURGERY — ARTHROPLASTY, HIP, TOTAL, ANTERIOR APPROACH
Anesthesia: Choice | Site: Hip | Laterality: Right

## 2015-01-27 ENCOUNTER — Encounter (HOSPITAL_COMMUNITY): Payer: Self-pay

## 2015-01-27 ENCOUNTER — Encounter (HOSPITAL_COMMUNITY)
Admission: RE | Admit: 2015-01-27 | Discharge: 2015-01-27 | Disposition: A | Discharge status: Home / Self Care | Payer: 59 | Source: Ambulatory Visit | Attending: Orthopedic Surgery | Admitting: Orthopedic Surgery

## 2015-01-27 DIAGNOSIS — Z01818 Encounter for other preprocedural examination: Secondary | ICD-10-CM | POA: Insufficient documentation

## 2015-01-27 HISTORY — DX: Anemia, unspecified: D64.9

## 2015-01-27 HISTORY — DX: Inflammatory liver disease, unspecified: K75.9

## 2015-01-27 HISTORY — DX: Personal history of urinary calculi: Z87.442

## 2015-01-27 HISTORY — DX: Gastro-esophageal reflux disease without esophagitis: K21.9

## 2015-01-27 LAB — SURGICAL PCR SCREEN
MRSA, PCR: NEGATIVE
Staphylococcus aureus: NEGATIVE

## 2015-01-27 LAB — CBC
HEMATOCRIT: 36.9 % (ref 36.0–46.0)
Hemoglobin: 11.4 g/dL — ABNORMAL LOW (ref 12.0–15.0)
MCH: 27.5 pg (ref 26.0–34.0)
MCHC: 30.9 g/dL (ref 30.0–36.0)
MCV: 88.9 fL (ref 78.0–100.0)
PLATELETS: 224 10*3/uL (ref 150–400)
RBC: 4.15 MIL/uL (ref 3.87–5.11)
RDW: 14.6 % (ref 11.5–15.5)
WBC: 6.8 10*3/uL (ref 4.0–10.5)

## 2015-01-27 LAB — COMPREHENSIVE METABOLIC PANEL
ALBUMIN: 4.2 g/dL (ref 3.5–5.2)
ALK PHOS: 72 U/L (ref 39–117)
ALT: 13 U/L (ref 0–35)
AST: 18 U/L (ref 0–37)
Anion gap: 8 (ref 5–15)
BILIRUBIN TOTAL: 0.2 mg/dL — AB (ref 0.3–1.2)
BUN: 12 mg/dL (ref 6–23)
CHLORIDE: 108 mmol/L (ref 96–112)
CO2: 28 mmol/L (ref 19–32)
Calcium: 8.9 mg/dL (ref 8.4–10.5)
Creatinine, Ser: 0.82 mg/dL (ref 0.50–1.10)
GFR calc Af Amer: >90 mL/min (ref >90)
GFR calc non Af Amer: 82 mL/min — ABNORMAL LOW (ref >90)
Glucose, Bld: 122 mg/dL — ABNORMAL HIGH (ref 70–99)
POTASSIUM: 4 mmol/L (ref 3.5–5.1)
SODIUM: 144 mmol/L (ref 135–145)
TOTAL PROTEIN: 7.9 g/dL (ref 6.0–8.3)

## 2015-01-27 LAB — URINALYSIS, ROUTINE W REFLEX MICROSCOPIC
Bilirubin Urine: NEGATIVE
GLUCOSE, UA: NEGATIVE mg/dL
Ketones, ur: NEGATIVE mg/dL
Nitrite: NEGATIVE
Protein, ur: NEGATIVE mg/dL
SPECIFIC GRAVITY, URINE: 1.013 (ref 1.005–1.030)
UROBILINOGEN UA: 0.2 mg/dL (ref 0.0–1.0)
pH: 6 (ref 5.0–8.0)

## 2015-01-27 LAB — URINE MICROSCOPIC-ADD ON

## 2015-01-27 LAB — PROTIME-INR
INR: 1.01 (ref 0.00–1.49)
PROTHROMBIN TIME: 13.4 s (ref 11.6–15.2)

## 2015-01-27 LAB — APTT: aPTT: 28 seconds (ref 24–37)

## 2015-01-27 LAB — HCG, SERUM, QUALITATIVE: Preg, Serum: NEGATIVE

## 2015-01-27 NOTE — Progress Notes (Signed)
Surgery clearance note 11/21/14 Dr. Moreen Fowler on chart, LOV note 11/18/14 Dr. Moreen Fowler on chart

## 2015-01-27 NOTE — Patient Instructions (Addendum)
Whitney Davenport  01/27/2015   Your procedure is scheduled on: Tuesday 02/03/15  Report to [facility] and follow signs to               [place] at 5:00AM.  Call this number if you have problems the morning of surgery [phone]   Remember:  Do not eat food or drink liquids :After Midnight.     Take these medicines the morning of surgery with A SIP OF WATER: klonopin if needed                                You may not have any metal on your body including hair pins and              piercings  Do not wear jewelry, make-up, lotions, powders or perfumes.             Do not wear nail polish.  Do not shave  48 hours prior to surgery.              Men may shave face and neck.  Do not bring valuables to the hospital. [place].  Contacts, dentures or bridgework may not be worn into surgery.  Leave suitcase in the car. After surgery it may be brought to your room.               Please read over the following fact sheets you were given: MRSA information  _____________________________________________________________________             [facility] - Preparing for Surgery Before surgery, you can play an important role.  Because skin is not sterile, your skin needs to be as free of germs as possible.  You can reduce the number of germs on your skin by washing with CHG (chlorahexidine gluconate) soap before surgery.  CHG is an antiseptic cleaner which kills germs and bonds with the skin to continue killing germs even after washing. Please DO NOT use if you have an allergy to CHG or antibacterial soaps.  If your skin becomes reddened/irritated stop using the CHG and inform your nurse when you arrive at Short Stay. Do not shave (including legs and underarms) for at least 48 hours prior to the first CHG shower.  You may shave your face/neck. Please follow these instructions carefully:  1.  Shower  with CHG Soap the night before surgery and the  morning of Surgery.  2.  If you choose to wash your hair, wash your hair first as usual with your  normal  shampoo.  3.  After you shampoo, rinse your hair and body thoroughly to remove the  shampoo.                            4.  Use CHG as you would any other liquid soap.  You can apply chg directly  to the skin and wash                       Gently with a scrungie or clean washcloth.  5.  Apply the CHG Soap to your body ONLY FROM THE NECK DOWN.  Do not use on face/ open                           Wound or open sores. Avoid contact with eyes, ears mouth and genitals (private parts).                       Wash face,  Genitals (private parts) with your normal soap.             6.  Wash thoroughly, paying special attention to the area where your surgery  will be performed.  7.  Thoroughly rinse your body with warm water from the neck down.  8.  DO NOT shower/wash with your normal soap after using and rinsing off  the CHG Soap.                9.  Pat yourself dry with a clean towel.            10.  Wear clean pajamas.            11.  Place clean sheets on your bed the night of your first shower and do not  sleep with pets. Day of Surgery : Do not apply any lotions/deodorants the morning of surgery.  Please wear clean clothes to the hospital/surgery center.  FAILURE TO FOLLOW THESE INSTRUCTIONS MAY RESULT IN THE CANCELLATION OF YOUR SURGERY PATIENT SIGNATURE_________________________________  NURSE SIGNATURE__________________________________  ________________________________________________________________________  WHAT IS A BLOOD TRANSFUSION? Blood Transfusion Information  A transfusion is the replacement of blood or some of its parts. Blood is made up of multiple cells which provide different functions.  Red blood cells carry oxygen and are used for blood loss replacement.  White blood cells fight against infection.  Platelets control  bleeding.  Plasma helps clot blood.  Other blood products are available for specialized needs, such as hemophilia or other clotting disorders. BEFORE THE TRANSFUSION  Who gives blood for transfusions?   Healthy volunteers who are fully evaluated to make sure their blood is safe. This is blood bank blood. Transfusion therapy is the safest it has ever been in the practice of medicine. Before blood is taken from a donor, a complete history is taken to make sure that person has no history of diseases nor engages in risky social behavior (examples are intravenous drug use or sexual activity with multiple partners). The donor's travel history is screened to minimize risk of transmitting infections, such as malaria. The donated blood is tested for signs of infectious diseases, such as HIV and hepatitis. The blood is then tested to be sure it is compatible with you in order to minimize the chance of a transfusion reaction. If you or a relative donates blood, this is often done in anticipation of surgery and is not appropriate for emergency situations. It takes many days to process the donated blood. RISKS AND COMPLICATIONS Although transfusion therapy is very safe and saves many lives, the main dangers of transfusion include:  1. Getting an infectious disease. 2. Developing a transfusion reaction. This is an allergic reaction to something in the blood you were given. Every precaution is taken to prevent this. The decision to have a blood transfusion has been considered carefully by your caregiver before blood is given. Blood is not given unless the benefits outweigh the risks. AFTER THE TRANSFUSION  Right after receiving a blood transfusion, you will usually feel much better and more energetic. This is especially  true if your red blood cells have gotten low (anemic). The transfusion raises the level of the red blood cells which carry oxygen, and this usually causes an energy increase.  The nurse  administering the transfusion will monitor you carefully for complications. HOME CARE INSTRUCTIONS  No special instructions are needed after a transfusion. You may find your energy is better. Speak with your caregiver about any limitations on activity for underlying diseases you may have. SEEK MEDICAL CARE IF:   Your condition is not improving after your transfusion.  You develop redness or irritation at the intravenous (IV) site. SEEK IMMEDIATE MEDICAL CARE IF:  Any of the following symptoms occur over the next 12 hours:  Shaking chills.  You have a temperature by mouth above 102 F (38.9 C), not controlled by medicine.  Chest, back, or muscle pain.  People around you feel you are not acting correctly or are confused.  Shortness of breath or difficulty breathing.  Dizziness and fainting.  You get a rash or develop hives.  You have a decrease in urine output.  Your urine turns a dark color or changes to pink, red, or brown. Any of the following symptoms occur over the next 10 days:  You have a temperature by mouth above 102 F (38.9 C), not controlled by medicine.  Shortness of breath.  Weakness after normal activity.  The white part of the eye turns yellow (jaundice).  You have a decrease in the amount of urine or are urinating less often.  Your urine turns a dark color or changes to pink, red, or brown. Document Released: 09/30/2000 Document Revised: 12/26/2011 Document Reviewed: 05/19/2008 ExitCare Patient Information 2014 [place].  _______________________________________________________________________  Incentive Spirometer  An incentive spirometer is a tool that can help keep your lungs clear and active. This tool measures how well you are filling your lungs with each breath. Taking long deep breaths may help reverse or decrease the chance of developing breathing (pulmonary) problems (especially infection) following:  A long period of time when you are  unable to move or be active. BEFORE THE PROCEDURE   If the spirometer includes an indicator to show your best effort, your nurse or respiratory therapist will set it to a desired goal.  If possible, sit up straight or lean slightly forward. Try not to slouch.  Hold the incentive spirometer in an upright position. INSTRUCTIONS FOR USE  3. Sit on the edge of your bed if possible, or sit up as far as you can in bed or on a chair. 4. Hold the incentive spirometer in an upright position. 5. Breathe out normally. 6. Place the mouthpiece in your mouth and seal your lips tightly around it. 7. Breathe in slowly and as deeply as possible, raising the piston or the ball toward the top of the column. 8. Hold your breath for 3-5 seconds or for as long as possible. Allow the piston or ball to fall to the bottom of the column. 9. Remove the mouthpiece from your mouth and breathe out normally. 10. Rest for a few seconds and repeat Steps 1 through 7 at least 10 times every 1-2 hours when you are awake. Take your time and take a few normal breaths between deep breaths. 11. The spirometer may include an indicator to show your best effort. Use the indicator as a goal to work toward during each repetition. 12. After each set of 10 deep breaths, practice coughing to be sure your lungs are clear. If you have  an incision (the cut made at the time of surgery), support your incision when coughing by placing a pillow or rolled up towels firmly against it. Once you are able to get out of bed, walk around indoors and cough well. You may stop using the incentive spirometer when instructed by your caregiver.  RISKS AND COMPLICATIONS  Take your time so you do not get dizzy or light-headed.  If you are in pain, you may need to take or ask for pain medication before doing incentive spirometry. It is harder to take a deep breath if you are having pain. AFTER USE  Rest and breathe slowly and easily.  It can be helpful to  keep track of a log of your progress. Your caregiver can provide you with a simple table to help with this. If you are using the spirometer at home, follow these instructions: [place] IF:   You are having difficultly using the spirometer.  You have trouble using the spirometer as often as instructed.  Your pain medication is not giving enough relief while using the spirometer.  You develop fever of 100.5 F (38.1 C) or higher. SEEK IMMEDIATE MEDICAL CARE IF:   You cough up bloody sputum that had not been present before.  You develop fever of 102 F (38.9 C) or greater.  You develop worsening pain at or near the incision site. MAKE SURE YOU:   Understand these instructions.  Will watch your condition.  Will get help right away if you are not doing well or get worse. Document Released: 02/13/2007 Document Revised: 12/26/2011 Document Reviewed: 04/16/2007 [facility] Patient Information 2014 [place], [place].   ________________________________________________________________________

## 2015-01-30 NOTE — H&P (Signed)
TOTAL HIP ADMISSION H&P  Patient is admitted for right total hip arthroplasty, anterior approach.  Subjective:  Chief Complaint:   Right hip primary OA / pain  HPI: Whitney Davenport, 49 y.o. female, has a history of pain and functional disability in the right hip(s) due to arthritis and patient has failed non-surgical conservative treatments for greater than 12 weeks to include NSAID's and/or analgesics, use of assistive devices and activity modification.  Onset of symptoms was gradual starting years ago with gradually worsening course since that time.The patient noted no past surgery on the right hip(s).  Patient currently rates pain in the right hip at 8 out of 10 with activity. Patient has worsening of pain with activity and weight bearing, trendelenberg gait, pain that interfers with activities of daily living and pain with passive range of motion. Patient has evidence of periarticular osteophytes and joint space narrowing by imaging studies. This condition presents safety issues increasing the risk of falls.  There is no current active infection.  Risks, benefits and expectations were discussed with the patient.  Risks including but not limited to the risk of anesthesia, blood clots, nerve damage, blood vessel damage, failure of the prosthesis, infection and up to and including death.  Patient understand the risks, benefits and expectations and wishes to proceed with surgery.   PCP: Gara Kroner, MD  D/C Plans:      Home with HHPT  Post-op Meds:       No Rx given   Tranexamic Acid:      To be given - IV   Decadron:      Is to be given  FYI:     ASA post-op  Oxycodone first ( if too strong then tramadol)    Past Medical History  Diagnosis Date  . Hypotension     no fainting in years  . Lumbar disc disease   . Allergy   . Anxiety   . Back injury     "ruptured disc-no problems since surgery"  . History of kidney stones   . GERD (gastroesophageal reflux disease)     occ  .  Anemia     hx of  . Hepatitis 2004    hx of hep c-"interferon treatment, does not show up on blood test anymore"    Past Surgical History  Procedure Laterality Date  . Ganglion cyst excision  age 50  . Lumbar laminectomy/decompression microdiscectomy  11/03/2011    Procedure: LUMBAR LAMINECTOMY/DECOMPRESSION MICRODISCECTOMY;  Surgeon: Hosie Spangle, MD;  Location: [facility];  Service: Neurosurgery;  Laterality: Left;  LEFT Lumbar five sacral one laminotomy and microdiskectomy  . Diagnostic laparoscopy  last 2001    for endometriosis, couple of surgeries    No prescriptions prior to admission   Allergies  Allergen Reactions  . Sulfa Antibiotics Nausea And Vomiting and Other (See Comments)    Causes headache with nausea  . Hydrocodone Itching and Rash  . Zithromax [Azithromycin] Itching and Rash    History  Substance Use Topics  . Smoking status: Former Smoker -- 15 years    Types: Cigarettes    Quit date: 10/31/2010  . Smokeless tobacco: Never Used  . Alcohol Use: Yes     Comment: rare    Family History  Problem Relation Age of Onset  . Diabetes Father   . Hyperlipidemia Father   . Cancer Maternal Grandmother      Review of Systems  Constitutional: Negative.   HENT: Negative.   Eyes:  Negative.   Respiratory: Negative.   Cardiovascular: Negative.   Gastrointestinal: Positive for heartburn.  Genitourinary: Negative.   Musculoskeletal: Positive for back pain and joint pain.  Skin: Negative.   Endo/Heme/Allergies: Positive for environmental allergies.  Psychiatric/Behavioral: The patient is nervous/anxious.     Objective:  Physical Exam  Constitutional: She is oriented to person, place, and time. She appears well-developed and well-nourished.  HENT:  Head: Normocephalic.  Eyes: Pupils are equal, round, and reactive to light.  Neck: Neck supple. No JVD present. No tracheal deviation present. No thyromegaly present.  Cardiovascular: Normal rate, regular  rhythm, normal heart sounds and intact distal pulses.   Respiratory: Effort normal and breath sounds normal. No stridor. No respiratory distress. She has no wheezes.  GI: Soft. There is no tenderness. There is no guarding.  Musculoskeletal:       Right hip: She exhibits decreased range of motion, decreased strength, tenderness and bony tenderness. She exhibits no swelling, no deformity and no laceration.  Lymphadenopathy:    She has no cervical adenopathy.  Neurological: She is alert and oriented to person, place, and time.  Skin: Skin is warm and dry.  Psychiatric: She has a normal mood and affect.      Labs:  Estimated body mass index is 32.26 kg/(m^2) as calculated from the following:   Height as of 10/24/14: 5\' 7"  (1.702 m).   Weight as of 10/24/14: 93.441 kg (206 lb).   Imaging Review Plain radiographs demonstrate severe degenerative joint disease of the right hip(s). The bone quality appears to be good for age and reported activity level.  Assessment/Plan:  End stage arthritis, right hip(s)  The patient history, physical examination, clinical judgement of the provider and imaging studies are consistent with end stage degenerative joint disease of the right hip(s) and total hip arthroplasty is deemed medically necessary. The treatment options including medical management, injection therapy, arthroscopy and arthroplasty were discussed at length. The risks and benefits of total hip arthroplasty were presented and reviewed. The risks due to aseptic loosening, infection, stiffness, dislocation/subluxation,  thromboembolic complications and other imponderables were discussed.  The patient acknowledged the explanation, agreed to proceed with the plan and consent was signed. Patient is being admitted for inpatient treatment for surgery, pain control, PT, OT, prophylactic antibiotics, VTE prophylaxis, progressive ambulation and ADL's and discharge planning.The patient is planning to be  discharged home with home health services.     [place] Pugh [facility]   PA-C  01/30/2015, 2:50 PM

## 2015-02-02 NOTE — Anesthesia Preprocedure Evaluation (Addendum)
Anesthesia Evaluation  Patient identified by MRN, date of birth, ID band Patient awake    Reviewed: Allergy & Precautions, NPO status , Patient's Chart, lab work & pertinent test results, reviewed documented beta blocker date and time   Airway Mallampati: II   Neck ROM: Full    Dental  (+) Teeth Intact, Dental Advisory Given   Pulmonary former smoker (quit 2012 15 pack year),  breath sounds clear to auscultation        Cardiovascular negative cardio ROS  Rhythm:Regular     Neuro/Psych Anxiety negative neurological ROS     GI/Hepatic GERD-  Medicated,(+) Hepatitis - (Rx'd, neg now), C  Endo/Other  negative endocrine ROS  Renal/GU negative Renal ROS     Musculoskeletal   Abdominal (+)  Abdomen: soft.    Peds  Hematology  (+) anemia , 11/37, INR 1.1   Anesthesia Other Findings   Reproductive/Obstetrics HCG neg 4/12                            Anesthesia Physical Anesthesia Plan  ASA: II  Anesthesia Plan: Spinal   Post-op Pain Management:    Induction:   Airway Management Planned: Natural Airway  Additional Equipment:   Intra-op Plan:   Post-operative Plan:   Informed Consent: I have reviewed the patients History and Physical, chart, labs and discussed the procedure including the risks, benefits and alternatives for the proposed anesthesia with the patient or authorized representative who has indicated his/her understanding and acceptance.     Plan Discussed with:   Anesthesia Plan Comments:         Anesthesia Quick Evaluation

## 2015-02-03 ENCOUNTER — Inpatient Hospital Stay (HOSPITAL_COMMUNITY): Payer: 59 | Admitting: Anesthesiology

## 2015-02-03 ENCOUNTER — Inpatient Hospital Stay (HOSPITAL_COMMUNITY)
Admission: RE | Admit: 2015-02-03 | Discharge: 2015-02-04 | DRG: 470 | Disposition: A | Discharge status: Home Health | Payer: 59 | Source: Ambulatory Visit | Attending: Orthopedic Surgery | Admitting: Orthopedic Surgery

## 2015-02-03 ENCOUNTER — Inpatient Hospital Stay (HOSPITAL_COMMUNITY): Payer: 59

## 2015-02-03 ENCOUNTER — Encounter (HOSPITAL_COMMUNITY): Payer: Self-pay | Admitting: *Deleted

## 2015-02-03 ENCOUNTER — Encounter (HOSPITAL_COMMUNITY)
Admission: RE | Disposition: A | Discharge status: Home Health | Payer: Self-pay | Source: Ambulatory Visit | Attending: Orthopedic Surgery

## 2015-02-03 DIAGNOSIS — Z87891 Personal history of nicotine dependence: Secondary | ICD-10-CM | POA: Diagnosis not present

## 2015-02-03 DIAGNOSIS — M1611 Unilateral primary osteoarthritis, right hip: Principal | ICD-10-CM | POA: Diagnosis present

## 2015-02-03 DIAGNOSIS — K219 Gastro-esophageal reflux disease without esophagitis: Secondary | ICD-10-CM | POA: Diagnosis present

## 2015-02-03 DIAGNOSIS — Z6832 Body mass index (BMI) 32.0-32.9, adult: Secondary | ICD-10-CM | POA: Diagnosis not present

## 2015-02-03 DIAGNOSIS — Z01812 Encounter for preprocedural laboratory examination: Secondary | ICD-10-CM | POA: Diagnosis not present

## 2015-02-03 DIAGNOSIS — E669 Obesity, unspecified: Secondary | ICD-10-CM | POA: Diagnosis present

## 2015-02-03 DIAGNOSIS — Z8619 Personal history of other infectious and parasitic diseases: Secondary | ICD-10-CM | POA: Diagnosis not present

## 2015-02-03 DIAGNOSIS — M25551 Pain in right hip: Secondary | ICD-10-CM | POA: Diagnosis present

## 2015-02-03 DIAGNOSIS — Z96649 Presence of unspecified artificial hip joint: Secondary | ICD-10-CM

## 2015-02-03 HISTORY — PX: TOTAL HIP ARTHROPLASTY: SHX124

## 2015-02-03 LAB — ABO/RH: ABO/RH(D): O POS

## 2015-02-03 LAB — TYPE AND SCREEN
ABO/RH(D): O POS
Antibody Screen: NEGATIVE

## 2015-02-03 SURGERY — ARTHROPLASTY, HIP, TOTAL, ANTERIOR APPROACH
Anesthesia: Spinal | Laterality: Right

## 2015-02-03 MED ORDER — PHENOL 1.4 % MT LIQD: 1.0000 | OROMUCOSAL | Status: DC | PRN

## 2015-02-03 MED ORDER — MIDAZOLAM HCL 2 MG/2ML IJ SOLN
INTRAMUSCULAR | Status: AC
  Filled 2015-02-03: qty 2

## 2015-02-03 MED ORDER — MIDAZOLAM HCL 5 MG/5ML IJ SOLN
INTRAMUSCULAR | Status: DC | PRN
  Administered 2015-02-03: 2 mg via INTRAVENOUS

## 2015-02-03 MED ORDER — ONDANSETRON HCL 4 MG PO TABS: 4.0000 mg | ORAL_TABLET | Freq: Four times a day (QID) | ORAL | Status: DC | PRN

## 2015-02-03 MED ORDER — RISAQUAD PO CAPS
1.0000 | ORAL_CAPSULE | Freq: Every day | ORAL | Status: DC
  Administered 2015-02-03: 1 via ORAL
  Filled 2015-02-03 (×2): qty 1

## 2015-02-03 MED ORDER — OXYCODONE HCL 5 MG PO TABS
5.0000 mg | ORAL_TABLET | ORAL | Status: DC
  Administered 2015-02-03: 10 mg via ORAL
  Administered 2015-02-03 (×2): 15 mg via ORAL
  Administered 2015-02-04: 10 mg via ORAL
  Administered 2015-02-04 (×3): 15 mg via ORAL
  Administered 2015-02-04: 10 mg via ORAL
  Filled 2015-02-03: qty 2
  Filled 2015-02-03 (×2): qty 3
  Filled 2015-02-03: qty 2
  Filled 2015-02-03 (×2): qty 3
  Filled 2015-02-03: qty 2
  Filled 2015-02-03: qty 3

## 2015-02-03 MED ORDER — MENTHOL 3 MG MT LOZG: 1.0000 | LOZENGE | OROMUCOSAL | Status: DC | PRN

## 2015-02-03 MED ORDER — PROPOFOL 10 MG/ML IV BOLUS
INTRAVENOUS | Status: AC
  Filled 2015-02-03: qty 20

## 2015-02-03 MED ORDER — FERROUS SULFATE 325 (65 FE) MG PO TABS
325.0000 mg | ORAL_TABLET | Freq: Three times a day (TID) | ORAL | Status: DC
  Filled 2015-02-03 (×5): qty 1

## 2015-02-03 MED ORDER — PROPOFOL INFUSION 10 MG/ML OPTIME
INTRAVENOUS | Status: DC | PRN
  Administered 2015-02-03: 50 ug/kg/min via INTRAVENOUS

## 2015-02-03 MED ORDER — FENTANYL CITRATE (PF) 100 MCG/2ML IJ SOLN: 25.0000 ug | INTRAMUSCULAR | Status: DC | PRN

## 2015-02-03 MED ORDER — ONDANSETRON HCL 4 MG/2ML IJ SOLN: 4.0000 mg | Freq: Four times a day (QID) | INTRAMUSCULAR | Status: DC | PRN

## 2015-02-03 MED ORDER — PROGESTERONE MICRONIZED 100 MG PO CAPS
100.0000 mg | ORAL_CAPSULE | Freq: Every day | ORAL | Status: DC
  Administered 2015-02-03: 100 mg via ORAL
  Filled 2015-02-03 (×2): qty 1

## 2015-02-03 MED ORDER — ASPIRIN EC 325 MG PO TBEC
325.0000 mg | DELAYED_RELEASE_TABLET | Freq: Two times a day (BID) | ORAL | Status: DC
  Administered 2015-02-04: 325 mg via ORAL
  Filled 2015-02-03 (×3): qty 1

## 2015-02-03 MED ORDER — SIMVASTATIN 40 MG PO TABS
40.0000 mg | ORAL_TABLET | Freq: Every day | ORAL | Status: DC
  Administered 2015-02-03: 40 mg via ORAL
  Filled 2015-02-03 (×2): qty 1

## 2015-02-03 MED ORDER — POLYETHYLENE GLYCOL 3350 17 G PO PACK
17.0000 g | PACK | Freq: Two times a day (BID) | ORAL | Status: DC
  Administered 2015-02-03 – 2015-02-04 (×2): 17 g via ORAL

## 2015-02-03 MED ORDER — ONDANSETRON HCL 4 MG/2ML IJ SOLN
INTRAMUSCULAR | Status: AC
  Filled 2015-02-03: qty 2

## 2015-02-03 MED ORDER — LACTATED RINGERS IV SOLN
INTRAVENOUS | Status: DC | PRN
  Administered 2015-02-03 (×3): via INTRAVENOUS

## 2015-02-03 MED ORDER — SERTRALINE HCL 100 MG PO TABS
100.0000 mg | ORAL_TABLET | Freq: Every day | ORAL | Status: DC
  Administered 2015-02-03: 100 mg via ORAL
  Filled 2015-02-03 (×2): qty 1

## 2015-02-03 MED ORDER — SODIUM CHLORIDE 0.9 % IV SOLN
100.0000 mL/h | INTRAVENOUS | Status: DC
  Administered 2015-02-03 – 2015-02-04 (×2): 100 mL/h via INTRAVENOUS
  Filled 2015-02-03 (×8): qty 1000

## 2015-02-03 MED ORDER — SODIUM CHLORIDE 0.9 % IR SOLN
Status: DC | PRN
  Administered 2015-02-03: 1000 mL

## 2015-02-03 MED ORDER — DEXAMETHASONE SODIUM PHOSPHATE 10 MG/ML IJ SOLN
INTRAMUSCULAR | Status: AC
  Filled 2015-02-03: qty 1

## 2015-02-03 MED ORDER — ONDANSETRON HCL 4 MG/2ML IJ SOLN
INTRAMUSCULAR | Status: DC | PRN
  Administered 2015-02-03: 4 mg via INTRAVENOUS

## 2015-02-03 MED ORDER — PROMETHAZINE HCL 25 MG/ML IJ SOLN: 6.2500 mg | INTRAMUSCULAR | Status: DC | PRN

## 2015-02-03 MED ORDER — METHOCARBAMOL 500 MG PO TABS
500.0000 mg | ORAL_TABLET | Freq: Four times a day (QID) | ORAL | Status: DC | PRN
  Administered 2015-02-03 – 2015-02-04 (×3): 500 mg via ORAL
  Filled 2015-02-03 (×3): qty 1

## 2015-02-03 MED ORDER — HYDROMORPHONE HCL 1 MG/ML IJ SOLN
0.5000 mg | INTRAMUSCULAR | Status: DC | PRN
  Administered 2015-02-03: 1 mg via INTRAVENOUS
  Administered 2015-02-03: 0.5 mg via INTRAVENOUS
  Filled 2015-02-03 (×2): qty 1

## 2015-02-03 MED ORDER — ALUM & MAG HYDROXIDE-SIMETH 200-200-20 MG/5ML PO SUSP: 30.0000 mL | ORAL | Status: DC | PRN

## 2015-02-03 MED ORDER — MEPERIDINE HCL 50 MG/ML IJ SOLN: 6.2500 mg | INTRAMUSCULAR | Status: DC | PRN

## 2015-02-03 MED ORDER — CEFAZOLIN SODIUM-DEXTROSE 2-3 GM-% IV SOLR
INTRAVENOUS | Status: AC
  Filled 2015-02-03: qty 50

## 2015-02-03 MED ORDER — MAGNESIUM CITRATE PO SOLN: 1.0000 | Freq: Once | ORAL | Status: AC | PRN

## 2015-02-03 MED ORDER — DIPHENHYDRAMINE HCL 25 MG PO CAPS: 25.0000 mg | ORAL_CAPSULE | Freq: Four times a day (QID) | ORAL | Status: DC | PRN

## 2015-02-03 MED ORDER — BUPIVACAINE HCL (PF) 0.5 % IJ SOLN
INTRAMUSCULAR | Status: DC | PRN
  Administered 2015-02-03: 3 mL

## 2015-02-03 MED ORDER — CEFAZOLIN SODIUM-DEXTROSE 2-3 GM-% IV SOLR
2.0000 g | Freq: Four times a day (QID) | INTRAVENOUS | Status: AC
  Administered 2015-02-03 (×2): 2 g via INTRAVENOUS
  Filled 2015-02-03 (×2): qty 50

## 2015-02-03 MED ORDER — DEXAMETHASONE SODIUM PHOSPHATE 10 MG/ML IJ SOLN
10.0000 mg | Freq: Once | INTRAMUSCULAR | Status: AC
  Administered 2015-02-04: 10 mg via INTRAVENOUS
  Filled 2015-02-03: qty 1

## 2015-02-03 MED ORDER — LORATADINE 10 MG PO TABS
10.0000 mg | ORAL_TABLET | Freq: Every day | ORAL | Status: DC
  Filled 2015-02-03 (×2): qty 1

## 2015-02-03 MED ORDER — FENTANYL CITRATE (PF) 100 MCG/2ML IJ SOLN
INTRAMUSCULAR | Status: AC
  Filled 2015-02-03: qty 2

## 2015-02-03 MED ORDER — TRANEXAMIC ACID 1000 MG/10ML IV SOLN
1000.0000 mg | Freq: Once | INTRAVENOUS | Status: AC
  Administered 2015-02-03: 1000 mg via INTRAVENOUS
  Filled 2015-02-03: qty 10

## 2015-02-03 MED ORDER — FENTANYL CITRATE (PF) 100 MCG/2ML IJ SOLN
INTRAMUSCULAR | Status: DC | PRN
  Administered 2015-02-03 (×2): 25 ug via INTRAVENOUS
  Administered 2015-02-03: 50 ug via INTRAVENOUS

## 2015-02-03 MED ORDER — CEFAZOLIN SODIUM-DEXTROSE 2-3 GM-% IV SOLR
2.0000 g | INTRAVENOUS | Status: AC
  Administered 2015-02-03: 2 g via INTRAVENOUS

## 2015-02-03 MED ORDER — METOCLOPRAMIDE HCL 10 MG PO TABS: 5.0000 mg | ORAL_TABLET | Freq: Three times a day (TID) | ORAL | Status: DC | PRN

## 2015-02-03 MED ORDER — METHOCARBAMOL 1000 MG/10ML IJ SOLN
500.0000 mg | Freq: Four times a day (QID) | INTRAVENOUS | Status: DC | PRN
  Filled 2015-02-03: qty 5

## 2015-02-03 MED ORDER — DEXAMETHASONE SODIUM PHOSPHATE 10 MG/ML IJ SOLN
10.0000 mg | Freq: Once | INTRAMUSCULAR | Status: AC
  Administered 2015-02-03: 10 mg via INTRAVENOUS

## 2015-02-03 MED ORDER — METOCLOPRAMIDE HCL 5 MG/ML IJ SOLN: 5.0000 mg | Freq: Three times a day (TID) | INTRAMUSCULAR | Status: DC | PRN

## 2015-02-03 MED ORDER — CLONAZEPAM 0.5 MG PO TABS: 0.5000 mg | ORAL_TABLET | Freq: Two times a day (BID) | ORAL | Status: DC | PRN

## 2015-02-03 MED ORDER — DOCUSATE SODIUM 100 MG PO CAPS
100.0000 mg | ORAL_CAPSULE | Freq: Two times a day (BID) | ORAL | Status: DC
  Administered 2015-02-03 – 2015-02-04 (×2): 100 mg via ORAL

## 2015-02-03 MED ORDER — PROPOFOL 10 MG/ML IV BOLUS
INTRAVENOUS | Status: DC | PRN
  Administered 2015-02-03 (×2): 20 mg via INTRAVENOUS

## 2015-02-03 MED ORDER — BISACODYL 10 MG RE SUPP: 10.0000 mg | Freq: Every day | RECTAL | Status: DC | PRN

## 2015-02-03 SURGICAL SUPPLY — 44 items
BAG DECANTER FOR FLEXI CONT (MISCELLANEOUS) IMPLANT
BAG ZIPLOCK 12X15 (MISCELLANEOUS) IMPLANT
CAPT HIP TOTAL 2 ×3 IMPLANT
COVER PERINEAL POST (MISCELLANEOUS) ×3 IMPLANT
DERMABOND ADVANCED (GAUZE/BANDAGES/DRESSINGS) ×2
DERMABOND ADVANCED .7 DNX12 (GAUZE/BANDAGES/DRESSINGS) ×1 IMPLANT
DRAPE C-ARM 42X120 X-RAY (DRAPES) ×3 IMPLANT
DRAPE STERI IOBAN 125X83 (DRAPES) ×3 IMPLANT
DRAPE U-SHAPE 47X51 STRL (DRAPES) ×9 IMPLANT
DRSG AQUACEL AG ADV 3.5X10 (GAUZE/BANDAGES/DRESSINGS) ×3 IMPLANT
DURAPREP 26ML APPLICATOR (WOUND CARE) ×3 IMPLANT
ELECT BLADE TIP CTD 4 INCH (ELECTRODE) ×3 IMPLANT
ELECT PENCIL ROCKER SW 15FT (MISCELLANEOUS) ×3 IMPLANT
ELECT REM PT RETURN 15FT ADLT (MISCELLANEOUS) ×3 IMPLANT
ELECT REM PT RETURN 9FT ADLT (ELECTROSURGICAL)
ELECTRODE REM PT RTRN 9FT ADLT (ELECTROSURGICAL) IMPLANT
FACESHIELD WRAPAROUND (MASK) ×12 IMPLANT
GLOVE BIOGEL PI IND STRL 7.5 (GLOVE) ×1 IMPLANT
GLOVE BIOGEL PI IND STRL 8.5 (GLOVE) ×1 IMPLANT
GLOVE BIOGEL PI INDICATOR 7.5 (GLOVE) ×2
GLOVE BIOGEL PI INDICATOR 8.5 (GLOVE) ×2
GLOVE ECLIPSE 8.0 STRL XLNG CF (GLOVE) ×6 IMPLANT
GLOVE ORTHO TXT STRL SZ7.5 (GLOVE) ×3 IMPLANT
GOWN SPEC L3 XXLG W/TWL (GOWN DISPOSABLE) ×3 IMPLANT
GOWN STRL REUS W/TWL LRG LVL3 (GOWN DISPOSABLE) ×3 IMPLANT
HOLDER FOLEY CATH W/STRAP (MISCELLANEOUS) ×3 IMPLANT
KIT BASIN OR (CUSTOM PROCEDURE TRAY) ×3 IMPLANT
LIQUID BAND (GAUZE/BANDAGES/DRESSINGS) ×3 IMPLANT
NDL SAFETY ECLIPSE 18X1.5 (NEEDLE) IMPLANT
NEEDLE HYPO 18GX1.5 SHARP (NEEDLE)
PACK TOTAL JOINT (CUSTOM PROCEDURE TRAY) ×3 IMPLANT
PEN SKIN MARKING BROAD (MISCELLANEOUS) ×3 IMPLANT
SAW OSC TIP CART 19.5X105X1.3 (SAW) ×3 IMPLANT
SUT MNCRL AB 4-0 PS2 18 (SUTURE) ×3 IMPLANT
SUT VIC AB 1 CT1 36 (SUTURE) ×9 IMPLANT
SUT VIC AB 2-0 CT1 27 (SUTURE) ×4
SUT VIC AB 2-0 CT1 TAPERPNT 27 (SUTURE) ×2 IMPLANT
SUT VLOC 180 0 24IN GS25 (SUTURE) ×3 IMPLANT
SYR 50ML LL SCALE MARK (SYRINGE) IMPLANT
TOWEL OR 17X26 10 PK STRL BLUE (TOWEL DISPOSABLE) ×3 IMPLANT
TOWEL OR NON WOVEN STRL DISP B (DISPOSABLE) IMPLANT
TRAY FOLEY W/METER SILVER 14FR (SET/KITS/TRAYS/PACK) ×3 IMPLANT
WATER STERILE IRR 1500ML POUR (IV SOLUTION) ×3 IMPLANT
YANKAUER SUCT BULB TIP 10FT TU (MISCELLANEOUS) ×3 IMPLANT

## 2015-02-03 NOTE — Anesthesia Procedure Notes (Addendum)
Spinal Patient location during procedure: OR Start time: 02/03/2015 7:06 AM End time: 02/03/2015 7:11 AM Staffing Anesthesiologist: Alexis Frock Resident/CRNA: Darlys Gales R Performed by: resident/CRNA  Preanesthetic Checklist Completed: patient identified, site marked, surgical consent, pre-op evaluation, timeout performed, IV checked, risks and benefits discussed and monitors and equipment checked Spinal Block Patient position: sitting Prep: Betadine Patient monitoring: heart rate, continuous pulse ox and blood pressure Location: L3-4 Injection technique: single-shot Needle Needle type: Spinocan  Needle gauge: 24 G Needle length: 9 cm Needle insertion depth: 7.5 cm Assessment Sensory level: T6 Additional Notes Expiration date of kit checked and confirmed. Patient tolerated procedure well, without complications. Lot [id number] Exp 2017-10

## 2015-02-03 NOTE — Transfer of Care (Addendum)
Immediate Anesthesia Transfer of Care Note  Patient: Whitney Davenport  Procedure(s) Performed: Procedure(s): RIGHT TOTAL HIP ARTHROPLASTY ANTERIOR APPROACH (Right)  Patient Location: PACU  Anesthesia Type:Spinal  Level of Consciousness:  sedated, patient cooperative and responds to stimulation  Airway & Oxygen Therapy:Patient Spontanous Breathing and Patient connected to face mask oxgen  Post-op Assessment:  Report given to PACU RN and Post -op Vital signs reviewed and stable. T12 spinal level.  Post vital signs:  Reviewed and stable  Last Vitals:  Filed Vitals:   02/03/15 0518  BP: 105/68  Pulse: 74  Temp: 36.8 C  Resp: 18    Complications: No apparent anesthesia complications

## 2015-02-03 NOTE — Op Note (Signed)
NAME:  Whitney Davenport                ACCOUNT NO.: [id number]      MEDICAL RECORD NO.: [id number]      FACILITY:  [facility]      PHYSICIAN:  Paralee Cancel D  DATE OF BIRTH:  [birth year]     DATE OF PROCEDURE:  02/03/2015                                 OPERATIVE REPORT         PREOPERATIVE DIAGNOSIS: Right  hip osteoarthritis.      POSTOPERATIVE DIAGNOSIS:  Right hip osteoarthritis.      PROCEDURE:  Right total hip replacement through an anterior approach   utilizing DePuy THR system, component size 27mm pinnacle cup, a size 36+4 neutral   Altrex liner, a size 3 Hi Tri Lock stem with a 36+5 delta ceramic   ball.      SURGEON:  Pietro Cassis. Alvan Dame, M.D.      ASSISTANT:  Danae Orleans, PA-C     ANESTHESIA:  Spinal.      SPECIMENS:  None.      COMPLICATIONS:  None.      BLOOD LOSS:  300 cc     DRAINS:  None.      INDICATION OF THE PROCEDURE:  Whitney Davenport is a 49 y.o. female who had   presented to office for evaluation of right hip pain.  Radiographs revealed   progressive degenerative changes with bone-on-bone   articulation to the  hip joint.  The patient had painful limited range of   motion significantly affecting their overall quality of life.  The patient was failing to    respond to conservative measures, and at this point was ready   to proceed with more definitive measures.  The patient has noted progressive   degenerative changes in his hip, progressive problems and dysfunction   with regarding the hip prior to surgery.  Consent was obtained for   benefit of pain relief.  Specific risk of infection, DVT, component   failure, dislocation, need for revision surgery, as well discussion of   the anterior versus posterior approach were reviewed.  Consent was   obtained for benefit of anterior pain relief through an anterior   approach.      PROCEDURE IN DETAIL:  The patient was brought to operative theater.   Once adequate anesthesia,  preoperative antibiotics, 2gm of Ancef, 1gm of Tranexamic Acid, and 10mg  of Decadron administered.   The patient was positioned supine on the OSI Hanna table.  Once adequate   padding of boney process was carried out, we had predraped out the hip, and  used fluoroscopy to confirm orientation of the pelvis and position.      The right hip was then prepped and draped from proximal iliac crest to   mid thigh with shower curtain technique.      Time-out was performed identifying the patient, planned procedure, and   extremity.     An incision was then made 2 cm distal and lateral to the   anterior superior iliac spine extending over the orientation of the   tensor fascia lata muscle and sharp dissection was carried down to the   fascia of the muscle and protractor placed in the soft tissues.      The fascia was then incised.  The muscle belly was identified and swept   laterally and retractor placed along the superior neck.  Following   cauterization of the circumflex vessels and removing some pericapsular   fat, a second cobra retractor was placed on the inferior neck.  A third   retractor was placed on the anterior acetabulum after elevating the   anterior rectus.  A L-capsulotomy was along the line of the   superior neck to the trochanteric fossa, then extended proximally and   distally.  Tag sutures were placed and the retractors were then placed   intracapsular.  We then identified the trochanteric fossa and   orientation of my neck cut, confirmed this radiographically   and then made a neck osteotomy with the femur on traction.  The femoral   head was removed without difficulty or complication.  Traction was let   off and retractors were placed posterior and anterior around the   acetabulum.      The labrum and foveal tissue were debrided.  I began reaming with a 8mm   reamer and reamed up to 30mm reamer with good bony bed preparation and a 51mm   cup was chosen.  The final 76mm  Pinnacle cup was then impacted under fluoroscopy  to confirm the depth of penetration and orientation with respect to   abduction.  A screw was placed followed by the hole eliminator.  The final   36+4 neutral Altrex liner was impacted with good visualized rim fit.  The cup was positioned anatomically within the acetabular portion of the pelvis.      At this point, the femur was rolled at 80 degrees.  Further capsule was   released off the inferior aspect of the femoral neck.  I then   released the superior capsule proximally.  The hook was placed laterally   along the femur and elevated manually and held in position with the bed   hook.  The leg was then extended and adducted with the leg rolled to 100   degrees of external rotation.  Once the proximal femur was fully   exposed, I used a box osteotome to set orientation.  I then began   broaching with the starting chili pepper broach and passed this by hand and then broached up to 3.  With the 3 broach in place I chose a high offset neck and did a trial reduction.  The offset was appropriate, leg lengths   appeared to be equal by sinking prosthesis a little further and using the +5 head ball.   Given these findings, I went ahead and dislocated the hip, repositioned all   retractors and positioned the right hip in the extended and abducted position.  The final 3 Hi Tri Lock stem was   chosen and it was impacted down to the level of neck cut.  Based on this   and the trial reduction, a 36+5 delta ceramic ball was chosen and   impacted onto a clean and dry trunnion, and the hip was reduced.  The   hip had been irrigated throughout the case again at this point.  I did   reapproximate the superior capsular leaflet to the anterior leaflet   using #1 Vicryl.  The fascia of the   tensor fascia lata muscle was then reapproximated using #1 Vicryl and #0 V-lock sutures.  The   remaining wound was closed with 2-0 Vicryl and running 4-0 Monocryl.   The  hip was cleaned, dried, and  dressed sterilely using Dermabond and   Aquacel dressing.  She was then brought   to recovery room in stable condition tolerating the procedure well.    Danae Orleans, PA-C was present for the entirety of the case involved from   preoperative positioning, perioperative retractor management, general   facilitation of the case, as well as primary wound closure as assistant.            Pietro Cassis Alvan Dame, M.D.        02/03/2015 8:24 AM

## 2015-02-03 NOTE — Interval H&P Note (Signed)
History and Physical Interval Note:  02/03/2015 6:40 AM  Whitney Davenport  has presented today for surgery, with the diagnosis of RIGHT HIP OA  The various methods of treatment have been discussed with the patient and family. After consideration of risks, benefits and other options for treatment, the patient has consented to  Procedure(s): RIGHT TOTAL HIP ARTHROPLASTY ANTERIOR APPROACH (Right) as a surgical intervention .  The patient's history has been reviewed, patient examined, no change in status, stable for surgery.  I have reviewed the patient's chart and labs.  Questions were answered to the patient's satisfaction.     Mauri Pole

## 2015-02-03 NOTE — Progress Notes (Signed)
CARE MANAGEMENT NOTE 02/03/2015  Patient:  Whitney Davenport, Whitney Davenport   Account Number:  [id number]  Date Initiated:  02/03/2015  Documentation initiated by:  Amarianna Abplanalp  Subjective/Objective Assessment:   total hip     Action/Plan:   home when stable   Anticipated DC Date:  02/06/2015   Anticipated DC Plan:  [place] referral  NA      DC Planning Services  CM consult      Choice offered to / List presented to:             Status of service:  In process, will continue to follow Medicare Important Message given?   (If response is "NO", the following Medicare IM given date fields will be blank) Date Medicare IM given:   Medicare IM given by:   Date Additional Medicare IM given:   Additional Medicare IM given by:    Discharge Disposition:    Per UR Regulation:  Reviewed for med. necessity/level of care/duration of stay  If discussed at [place] of Stay Meetings, dates discussed:    Comments:  February 03, 2015/Bionca Mckey L. Rosana Hoes, RN, BSN, CCM. Case Management [place] [phone] No discharge needs present of time of review.

## 2015-02-03 NOTE — Discharge Instructions (Signed)

## 2015-02-03 NOTE — Progress Notes (Signed)
PT Cancellation Note  Patient Details Name: Whitney Davenport MRN: [id number] DOB: [birth year]   Cancelled Treatment:    Reason Eval/Treat Not Completed: Other (comment) (checked on pt twice, she reports difficulty with spinal wearing off and then too groggy)   Harutyun Monteverde,KATHrine E 02/03/2015, 3:54 PM

## 2015-02-03 NOTE — Anesthesia Postprocedure Evaluation (Signed)
  Anesthesia Post-op Note  Patient: Whitney Davenport  Procedure(s) Performed: Procedure(s): RIGHT TOTAL HIP ARTHROPLASTY ANTERIOR APPROACH (Right)  Patient Location: PACU  Anesthesia Type:Spinal  Level of Consciousness: awake and alert   Airway and Oxygen Therapy: Patient Spontanous Breathing and Patient connected to nasal cannula oxygen  Post-op Pain: none  Post-op Assessment: Post-op Vital signs reviewed, Patient's Cardiovascular Status Stable and Patent Airway  Post-op Vital Signs: Reviewed and stable  Last Vitals:  Filed Vitals:   02/03/15 0518  BP: 105/68  Pulse: 74  Temp: 36.8 C  Resp: 18    Complications: No apparent anesthesia complications

## 2015-02-04 DIAGNOSIS — E669 Obesity, unspecified: Secondary | ICD-10-CM | POA: Diagnosis present

## 2015-02-04 LAB — BASIC METABOLIC PANEL
Anion gap: 7 (ref 5–15)
BUN: 11 mg/dL (ref 6–23)
CO2: 25 mmol/L (ref 19–32)
Calcium: 8.2 mg/dL — ABNORMAL LOW (ref 8.4–10.5)
Chloride: 107 mmol/L (ref 96–112)
Creatinine, Ser: 0.87 mg/dL (ref 0.50–1.10)
GFR calc Af Amer: 89 mL/min — ABNORMAL LOW (ref >90)
GFR calc non Af Amer: 76 mL/min — ABNORMAL LOW (ref >90)
GLUCOSE: 110 mg/dL — AB (ref 70–99)
Potassium: 4 mmol/L (ref 3.5–5.1)
SODIUM: 139 mmol/L (ref 135–145)

## 2015-02-04 LAB — CBC
HEMATOCRIT: 33.3 % — AB (ref 36.0–46.0)
Hemoglobin: 10.2 g/dL — ABNORMAL LOW (ref 12.0–15.0)
MCH: 27.5 pg (ref 26.0–34.0)
MCHC: 30.6 g/dL (ref 30.0–36.0)
MCV: 89.8 fL (ref 78.0–100.0)
Platelets: 226 10*3/uL (ref 150–400)
RBC: 3.71 MIL/uL — AB (ref 3.87–5.11)
RDW: 14.6 % (ref 11.5–15.5)
WBC: 13.1 10*3/uL — AB (ref 4.0–10.5)

## 2015-02-04 MED ORDER — POLYETHYLENE GLYCOL 3350 17 G PO PACK: 17.0000 g | PACK | Freq: Two times a day (BID) | ORAL | Status: DC

## 2015-02-04 MED ORDER — ASPIRIN 325 MG PO TBEC: 325.0000 mg | DELAYED_RELEASE_TABLET | Freq: Two times a day (BID) | ORAL | Status: AC

## 2015-02-04 MED ORDER — FAMOTIDINE 10 MG PO TABS: 10.0000 mg | ORAL_TABLET | Freq: Every day | ORAL | Status: DC

## 2015-02-04 MED ORDER — DOCUSATE SODIUM 100 MG PO CAPS: 100.0000 mg | ORAL_CAPSULE | Freq: Two times a day (BID) | ORAL | Status: DC

## 2015-02-04 MED ORDER — METHOCARBAMOL 500 MG PO TABS: 500.0000 mg | ORAL_TABLET | Freq: Four times a day (QID) | ORAL | Status: DC | PRN

## 2015-02-04 MED ORDER — FAMOTIDINE 10 MG PO TABS
10.0000 mg | ORAL_TABLET | Freq: Every day | ORAL | Status: DC
  Filled 2015-02-04 (×2): qty 0.5

## 2015-02-04 MED ORDER — OXYCODONE HCL 5 MG PO TABS: 5.0000 mg | ORAL_TABLET | ORAL | Status: DC | PRN

## 2015-02-04 MED ORDER — FERROUS SULFATE 325 (65 FE) MG PO TABS: 325.0000 mg | ORAL_TABLET | Freq: Three times a day (TID) | ORAL | Status: DC

## 2015-02-04 NOTE — Evaluation (Signed)
Physical Therapy Evaluation Patient Details Name: Whitney Davenport MRN: [id number] DOB: 08/06/1966 Today's Date: 02/04/2015   History of Present Illness  Pt is 49 y/o female s/p R THA.  Clinical Impression  Pt is s/p R THA resulting in the deficits listed below. (see PT Problem List). Patient will benefit from skilled PT to increase their independence and safety with mobility to allow discharge home. Pt able to ambulate short distance. Possible d/c home today and plan to practice stairs this afternoon when spouse is here.     Follow Up Recommendations Home health PT    Equipment Recommendations  None recommended by PT    Recommendations for Other Services       Precautions / Restrictions Precautions Precautions: Fall Restrictions Weight Bearing Restrictions: No Other Position/Activity Restrictions: WBAT      Mobility  Bed Mobility Overal bed mobility: Needs Assistance Bed Mobility: Supine to Sit     Supine to sit: Min guard     General bed mobility comments: min guard for safety  Transfers Overall transfer level: Needs assistance Equipment used: Rolling walker (2 wheeled) Transfers: Sit to/from Stand Sit to Stand: Min guard         General transfer comment: verbal cues given for UE and LE placement  Ambulation/Gait Ambulation/Gait assistance: Min guard Ambulation Distance (Feet): 80 Feet Assistive device: Rolling walker (2 wheeled) Gait Pattern/deviations: Step-to pattern;Antalgic     General Gait Details: verbal cues given for sequence, RW distance and step length. min guard for safety  Stairs            Wheelchair Mobility    Modified Rankin (Stroke Patients Only)       Balance                                             Pertinent Vitals/Pain Pain Assessment: 0-10 Pain Score: 5  Pain Location: R hip Pain Descriptors / Indicators: Aching;Sore Pain Intervention(s): Limited activity within patient's  tolerance;Monitored during session    [place] expects to be discharged to:: Private residence Living Arrangements: Spouse/significant other Available Help at Discharge:  (Husband 24/7 til Monday) Type of Home: House Home Access: Stairs to enter Entrance Stairs-Rails: Can reach both Entrance Stairs-Number of Steps: [place]: [place] - 2 wheels;Cane - single point;Bedside commode      Prior Function Level of Independence: Independent               Hand Dominance        Extremity/Trunk Assessment   Upper Extremity Assessment: Overall WFL for tasks assessed           Lower Extremity Assessment: RLE deficits/detail RLE Deficits / Details: decreased functional strength observed       Communication   Communication: No difficulties  Cognition Arousal/Alertness: Awake/alert Behavior During Therapy: WFL for tasks assessed/performed Overall Cognitive Status: Within Functional Limits for tasks assessed                      General Comments      Exercises        Assessment/Plan    PT Assessment Patient needs continued PT services  PT Diagnosis Difficulty walking;Acute pain   PT Problem List Decreased strength;Decreased range of motion;Decreased activity tolerance;Decreased mobility;Decreased knowledge of use of DME;Pain  PT Treatment Interventions DME instruction;Gait training;Stair training;Functional mobility  training;Therapeutic activities;Therapeutic exercise;Patient/family education   PT Goals (Current goals can be found in the Care Plan section) Acute Rehab PT Goals Patient Stated Goal: less pain, get back to being independent PT Goal Formulation: With patient Time For Goal Achievement: 02/11/15 Potential to Achieve Goals: Good    Frequency 7X/week   Barriers to discharge        Co-evaluation               End of Session Equipment Utilized During Treatment: Gait belt Activity Tolerance: Patient tolerated  treatment well Patient left: in chair;with call bell/phone within reach;with family/visitor present Nurse Communication: Mobility status         Time: 7062-3762 PT Time Calculation (min) (ACUTE ONLY): 17 min   Charges:   PT Evaluation $Initial PT Evaluation Tier I: 1 Procedure     PT G Codes:        Daiya Tamer 02/04/2015, 1:04 PM Charlott Holler, SPT

## 2015-02-04 NOTE — Evaluation (Signed)
Occupational Therapy Evaluation Patient Details Name: Whitney Davenport MRN: [id number] DOB: [birth year] Today's Date: 02/04/2015    History of Present Illness s/p R DA THA   Clinical Impression   This 49 year old female was admitted for the above surgery.  All education was completed.  No further OT is needed at this time.    Follow Up Recommendations  No OT follow up (initial 24/7)    Equipment Recommendations  None recommended by OT    Recommendations for Other Services       Precautions / Restrictions Precautions Precautions: Fall Restrictions Weight Bearing Restrictions: No      Mobility Bed Mobility Overal bed mobility: Needs Assistance Bed Mobility: Supine to Sit     Supine to sit: Min assist     General bed mobility comments: light support for RLE  Transfers Overall transfer level: Needs assistance Equipment used: Rolling walker (2 wheeled) Transfers: Sit to/from Stand Sit to Stand: Min assist         General transfer comment: assist to rise and steady.  Cues for UE/LE placement    Balance                                            ADL Overall ADL's : Needs assistance/impaired     Grooming: Oral care;Set up;Sitting   Upper Body Bathing: Set up;Sitting   Lower Body Bathing: Moderate assistance;Sit to/from stand   Upper Body Dressing : Set up;Sitting   Lower Body Dressing: Moderate assistance;Sit to/from stand       Toileting- Water quality scientist and Hygiene: Minimal assistance;Sit to/from stand         General ADL Comments: Pt sat EOB and performed ADL--she had just finished using commode.  Pt has a reacher at home:  demonstrated uses.  She wears slip on shoes with a back, no socks.  Reviewed tub readiness vs tub bench, vs setting commode in sideways and having feet supported on outside of tub.  Pt states that bottom is a little slippery and mat does not work.     Vision     Perception     Praxis       Pertinent Vitals/Pain Pain Assessment: 0-10 Pain Score: 5  Pain Location: R hip Pain Descriptors / Indicators: Sore;Aching Pain Intervention(s): Limited activity within patient's tolerance;Monitored during session;Premedicated before session;Repositioned;Ice applied     Hand Dominance     Extremity/Trunk Assessment Upper Extremity Assessment Upper Extremity Assessment: Overall WFL for tasks assessed           Communication Communication Communication: No difficulties   Cognition Arousal/Alertness: Awake/alert Behavior During Therapy: WFL for tasks assessed/performed Overall Cognitive Status: Within Functional Limits for tasks assessed                     General Comments       Exercises       Shoulder Instructions      Home Living Family/patient expects to be discharged to:: Private residence Living Arrangements: Spouse/significant other Available Help at Discharge:  (Husband 24/7 til Monday) Type of Home: House Home Access: Stairs to enter [employer] of Steps: 3         Bathroom Shower/Tub: Tub/shower unit Shower/tub characteristics: Architectural technologist: Standard     Home Equipment: Bedside commode          Prior Functioning/Environment Level  of Independence: Independent             OT Diagnosis: Generalized weakness   OT Problem List:     OT Treatment/Interventions:      OT Goals(Current goals can be found in the care plan section) Acute Rehab OT Goals Patient Stated Goal: less pain, get back to being independent  OT Frequency:     Barriers to D/C:            Co-evaluation              End of Session    Activity Tolerance: Patient tolerated treatment well Patient left: in bed;with call bell/phone within reach   Time: 1035-1058 OT Time Calculation (min): 23 min Charges:  OT General Charges $OT Visit: 1 Procedure OT Evaluation $Initial OT Evaluation Tier I: 1 Procedure G-Codes:     Charmaine Placido 02/10/15, 11:16 AM   Lesle Chris, OTR/L [phone] 02-10-2015

## 2015-02-04 NOTE — Progress Notes (Addendum)
Physical Therapy Treatment Note    02/04/15 1400  PT Visit Information  Last PT Received On 02/04/15  Assistance Needed +1  History of Present Illness Pt is 49 y/o female s/p R THA.  PT Time Calculation  PT Start Time (ACUTE ONLY) 1401  PT Stop Time (ACUTE ONLY) 1434  PT Time Calculation (min) (ACUTE ONLY) 33 min  Subjective Data  Subjective Pt performed LE exercises and then ambulated in hallway.  Pt mobilizing well, also practiced steps.  Pt feels ready for d/c home today.  Pt provided with HEP handout and had no further questions/concerns.  Precautions  Precautions Fall  Restrictions  Other Position/Activity Restrictions WBAT  Pain Assessment  Pain Assessment 0-10  Pain Score 3  Pain Location R hip  Pain Descriptors / Indicators Aching;Sore  Pain Intervention(s) Limited activity within patient's tolerance;Repositioned;Monitored during session  Cognition  Arousal/Alertness Awake/alert  Behavior During Therapy [facility] for tasks assessed/performed  Overall Cognitive Status Within Functional Limits for tasks assessed  Bed Mobility  Overal bed mobility Needs Assistance  Bed Mobility Supine to Sit;Sit to Supine  Supine to sit Supervision  Sit to supine Supervision  General bed mobility comments verbal cues for self assist  Transfers  Overall transfer level Needs assistance  Equipment used Rolling walker (2 wheeled)  Transfers Sit to/from Stand  Sit to Stand Supervision  General transfer comment verbal cues for hand placement  Ambulation/Gait  Ambulation/Gait assistance Min guard;Supervision  Ambulation Distance (Feet) 160 Feet  Assistive device Rolling walker (2 wheeled)  Gait Pattern/deviations Step-through pattern;Antalgic  General Gait Details verbal cues given for sequence, RW distance and step length  Exercises  Exercises Total Joint  Total Joint Exercises  Ankle Circles/Pumps AROM;15 reps;Both  Quad Sets AROM;Both;15 reps  Towel Squeeze AROM;Both;15 reps  Short Arc  Quad AROM;Right;15 reps  Heel Slides AAROM;15 reps;Both  Hip ABduction/ADduction AAROM;Right;15 reps  Straight Leg Raises AAROM;15 reps;Both  PT - End of Session  Equipment Utilized During Treatment Gait belt  Activity Tolerance Patient tolerated treatment well  Patient left with call bell/phone within reach;in bed  PT - Assessment/Plan  PT Plan Current plan remains appropriate  PT Frequency (ACUTE ONLY) 7X/week  Follow Up Recommendations Home health PT  PT equipment None recommended by PT  PT Goal Progression  Progress towards PT goals Progressing toward goals  PT General Charges  $$ ACUTE PT VISIT 1 Procedure  PT Treatments  $Gait Training 8-22 mins  $Therapeutic Exercise 8-22 mins  Pt also performed 2 steps up and then 3 down holding both rails with cues for sequence.  Pt performed twice and felt comfortable performing steps. Carmelia Bake, PT, DPT 02/04/2015 Pager: [phone]

## 2015-02-04 NOTE — Progress Notes (Signed)
     Subjective: 1 Day Post-Op Procedure(s) (LRB): RIGHT TOTAL HIP ARTHROPLASTY ANTERIOR APPROACH (Right)   Patient reports pain as mild, pain controlled. Few incidents of increasing pain, but overall better than prior to surgery. No events throughout the night. Ready to be discharged home if she does well with PT and pain stays controlled.   Objective:   VITALS:   Filed Vitals:   02/04/15 0549  BP: 99/67  Pulse: 65  Temp: 98 F (36.7 C)  Resp: 14    Dorsiflexion/Plantar flexion intact Incision: dressing C/[place]/I No cellulitis present Compartment soft  LABS  Recent Labs  02/04/15 0519  HGB 10.2*  HCT 33.3*  WBC 13.1*  PLT 226     Recent Labs  02/04/15 0519  NA 139  K 4.0  BUN 11  CREATININE 0.87  GLUCOSE 110*     Assessment/Plan: 1 Day Post-Op Procedure(s) (LRB): RIGHT TOTAL HIP ARTHROPLASTY ANTERIOR APPROACH (Right) Foley cath [place]/c'ed Advance diet Up with therapy [place]/C IV fluids Discharge home with home health  Follow up in 2 weeks at [facility]. Follow up with OLIN,Whitney Davenport in 2 weeks.  Contact information:  [facility] [address] [place] [phone]    Obese (BMI 30-39.9) Estimated body mass index is 32.41 kg/(m^2) as calculated from the following:   Height as of this encounter: 5\' 7"  (1.702 m).   Weight as of this encounter: 93.895 kg (207 lb). Patient also counseled that weight may inhibit the healing process Patient counseled that losing weight [place] help with future health issues       Whitney Davenport. Whitney Davenport   PAC  02/04/2015, 7:42 AM

## 2015-02-13 NOTE — Discharge Summary (Signed)
Physician Discharge Summary  Patient ID: Whitney Davenport MRN: [id number] DOB/AGE: 1966/03/02 49 y.o.  Admit date: 02/03/2015 Discharge date: 02/04/2015   Procedures:  Procedure(s) (LRB): RIGHT TOTAL HIP ARTHROPLASTY ANTERIOR APPROACH (Right)  Attending Physician:  Dr. Paralee Cancel   Admission Diagnoses:   Right hip primary OA / pain  Discharge Diagnoses:  Principal Problem:   S/P right THA, AA Active Problems:   Obese  Past Medical History  Diagnosis Date  . Hypotension     no fainting in years  . Lumbar disc disease   . Allergy   . Anxiety   . Back injury     "ruptured disc-no problems since surgery"  . History of kidney stones   . GERD (gastroesophageal reflux disease)     occ  . Anemia     hx of  . Hepatitis 2004    hx of hep c-"interferon treatment, does not show up on blood test anymore"    HPI:    Whitney Davenport, 49 y.o. female, has a history of pain and functional disability in the right hip(s) due to arthritis and patient has failed non-surgical conservative treatments for greater than 12 weeks to include NSAID's and/or analgesics, use of assistive devices and activity modification. Onset of symptoms was gradual starting years ago with gradually worsening course since that time.The patient noted no past surgery on the right hip(s). Patient currently rates pain in the right hip at 8 out of 10 with activity. Patient has worsening of pain with activity and weight bearing, trendelenberg gait, pain that interfers with activities of daily living and pain with passive range of motion. Patient has evidence of periarticular osteophytes and joint space narrowing by imaging studies. This condition presents safety issues increasing the risk of falls. There is no current active infection. Risks, benefits and expectations were discussed with the patient. Risks including but not limited to the risk of anesthesia, blood clots, nerve damage, blood vessel damage, failure of  the prosthesis, infection and up to and including death. Patient understand the risks, benefits and expectations and wishes to proceed with surgery.  PCP: Gara Kroner, MD   Discharged Condition: good  Hospital Course:  Patient underwent the above stated procedure on 02/03/2015. Patient tolerated the procedure well and brought to the recovery room in good condition and subsequently to the floor.  POD #1 BP: 99/67 ; Pulse: 65 ; Temp: 98 F (36.7 C) ; Resp: 14 Patient reports pain as mild, pain controlled. Few incidents of increasing pain, but overall better than prior to surgery. No events throughout the night. Ready to be discharged home. Dorsiflexion/plantar flexion intact, incision: dressing C/D/I, no cellulitis present and compartment soft.   LABS  Basename    HGB  10.2  HCT  33.3    Discharge Exam: General appearance: alert, cooperative and no distress Extremities: Homans sign is negative, no sign of DVT, no edema, redness or tenderness in the calves or thighs and no ulcers, gangrene or trophic changes  Disposition: Home with follow up in 2 weeks   Follow-up Information    Follow up with Mauri Pole, MD. Schedule an appointment as soon as possible for a visit in 2 weeks.   Specialty:  Orthopedic Surgery   Contact information:   [address] [place] [address] [phone]       Discharge Instructions    Call MD / Call 911    Complete by:  As directed   If you experience chest  pain or shortness of breath, CALL 911 and be transported to the hospital emergency room.  If you develope a fever above 101 F, pus (white drainage) or increased drainage or redness at the wound, or calf pain, call your surgeon's office.     Change dressing    Complete by:  As directed   Maintain surgical dressing until follow up in the clinic. If the edges start to pull up, may reinforce with tape. If the dressing is no longer working, may remove and cover with gauze and  tape, but must keep the area dry and clean.  Call with any questions or concerns.     Constipation Prevention    Complete by:  As directed   Drink plenty of fluids.  Prune juice may be helpful.  You may use a stool softener, such as Colace (over the counter) 100 mg twice a day.  Use MiraLax (over the counter) for constipation as needed.     Diet - low sodium heart healthy    Complete by:  As directed      Discharge instructions    Complete by:  As directed   Maintain surgical dressing until follow up in the clinic. If the edges start to pull up, may reinforce with tape. If the dressing is no longer working, may remove and cover with gauze and tape, but must keep the area dry and clean.  Follow up in 2 weeks at [facility]. Call with any questions or concerns.     Increase activity slowly as tolerated    Complete by:  As directed      TED hose    Complete by:  As directed   Use stockings (TED hose) for 2 weeks on both leg(s).  You may remove them at night for sleeping.     Weight bearing as tolerated    Complete by:  As directed   Laterality:  right  Extremity:  Lower             Medication List    STOP taking these medications        naproxen sodium 220 MG tablet  Commonly known as:  ANAPROX      TAKE these medications        acidophilus Caps capsule  Take 1 capsule by mouth at bedtime.     aspirin 325 MG EC tablet  Take 1 tablet (325 mg total) by mouth 2 (two) times daily.     cetirizine 10 MG tablet  Commonly known as:  ZYRTEC  Take 10 mg by mouth at bedtime.     clonazePAM 0.5 MG tablet  Commonly known as:  KLONOPIN  Take 0.5 mg by mouth 2 (two) times daily as needed for anxiety.     docusate sodium 100 MG capsule  Commonly known as:  COLACE  Take 1 capsule (100 mg total) by mouth 2 (two) times daily.     estradiol 0.075 MG/24HR  Commonly known as:  VIVELLE-DOT  Place 1 patch onto the skin 2 (two) times a week.     ferrous sulfate 325 (65 FE) MG  tablet  Take 1 tablet (325 mg total) by mouth 3 (three) times daily after meals.     methocarbamol 500 MG tablet  Commonly known as:  ROBAXIN  Take 1 tablet (500 mg total) by mouth every 6 (six) hours as needed for muscle spasms.     oxyCODONE 5 MG immediate release tablet  Commonly known as:  Oxy IR/ROXICODONE  Take 1-3 tablets (5-15 mg total) by mouth every 4 (four) hours as needed for severe pain.     polyethylene glycol packet  Commonly known as:  MIRALAX / GLYCOLAX  Take 17 g by mouth 2 (two) times daily.     progesterone 100 MG capsule  Commonly known as:  PROMETRIUM  Take 100 mg by mouth at bedtime.     sertraline 100 MG tablet  Commonly known as:  ZOLOFT  Take 100 mg by mouth at bedtime.     simvastatin 40 MG tablet  Commonly known as:  ZOCOR  Take 40 mg by mouth at bedtime.         Signed: West Pugh. Donel Osowski   PA-C  02/13/2015, 7:24 AM

## 2015-10-20 MED FILL — MINIVELLE 0.075 MG PATCH: 0.075 | 28 days supply | Qty: 8 | Fill #5

## 2015-11-16 MED FILL — PROGESTERONE 100 MG CAPSULE: 100 | 30 days supply | Qty: 30 | Fill #5

## 2015-11-16 MED FILL — MINIVELLE 0.075 MG PATCH: 0.075 | 28 days supply | Qty: 8 | Fill #6

## 2015-11-17 DIAGNOSIS — Z23 Encounter for immunization: Secondary | ICD-10-CM | POA: Diagnosis not present

## 2015-11-17 DIAGNOSIS — Z96641 Presence of right artificial hip joint: Secondary | ICD-10-CM | POA: Diagnosis not present

## 2015-11-17 DIAGNOSIS — B1920 Unspecified viral hepatitis C without hepatic coma: Secondary | ICD-10-CM | POA: Diagnosis not present

## 2015-11-17 DIAGNOSIS — E782 Mixed hyperlipidemia: Secondary | ICD-10-CM | POA: Diagnosis not present

## 2015-11-17 DIAGNOSIS — F419 Anxiety disorder, unspecified: Secondary | ICD-10-CM | POA: Diagnosis not present

## 2015-11-17 DIAGNOSIS — J309 Allergic rhinitis, unspecified: Secondary | ICD-10-CM | POA: Diagnosis not present

## 2015-11-17 MED FILL — SIMVASTATIN 40 MG TABLET: 40 | 90 days supply | Qty: 90 | Fill #0

## 2015-12-03 MED FILL — clonazePAM 0.5 MG TABS: 0.5 | 15 days supply | Qty: 15 | Fill #0

## 2015-12-03 MED FILL — SERTRALINE HCL 100 MG TAB: 100 | 90 days supply | Qty: 90 | Fill #0

## 2015-12-15 MED FILL — PROGESTERONE 100 MG CAPSULE: 100 | 30 days supply | Qty: 30 | Fill #6

## 2015-12-15 MED FILL — MINIVELLE 0.075 MG PATCH: 0.075 | 28 days supply | Qty: 8 | Fill #7

## 2016-01-11 MED FILL — MINIVELLE 0.075 MG PATCH: 0.075 | 28 days supply | Qty: 8 | Fill #8

## 2016-01-11 MED FILL — PROGESTERONE 100 MG CAPSULE: 100 | 30 days supply | Qty: 30 | Fill #7

## 2016-02-09 MED FILL — MINIVELLE 0.075 MG PATCH: 0.075 | 28 days supply | Qty: 8 | Fill #9

## 2016-02-15 MED FILL — SIMVASTATIN 40 MG TABLET: 40 | 90 days supply | Qty: 90 | Fill #1

## 2016-02-15 MED FILL — PROGESTERONE 100 MG CAPSULE: 100 | 30 days supply | Qty: 30 | Fill #8

## 2016-03-02 MED FILL — SERTRALINE HCL 100 MG TAB: 100 | 90 days supply | Qty: 90 | Fill #1

## 2016-03-07 MED FILL — MINIVELLE 0.075 MG PATCH: 0.075 | 28 days supply | Qty: 8 | Fill #10

## 2016-03-17 MED FILL — PROGESTERONE 100 MG CAPSULE: 100 | 30 days supply | Qty: 30 | Fill #9

## 2016-04-05 MED FILL — MINIVELLE 0.075 MG PATCH: 0.075 | 28 days supply | Qty: 8 | Fill #11

## 2016-04-18 MED FILL — PROGESTERONE 100 MG CAPSULE: 100 | 30 days supply | Qty: 30 | Fill #10 | Status: TO

## 2016-05-02 DIAGNOSIS — H52223 Regular astigmatism, bilateral: Secondary | ICD-10-CM | POA: Diagnosis not present

## 2016-05-02 DIAGNOSIS — H5213 Myopia, bilateral: Secondary | ICD-10-CM | POA: Diagnosis not present

## 2016-05-02 DIAGNOSIS — H524 Presbyopia: Secondary | ICD-10-CM | POA: Diagnosis not present

## 2016-05-03 MED FILL — MINIVELLE 0.075 MG PATCH: 0.075 | 28 days supply | Qty: 8 | Fill #12

## 2016-05-16 MED FILL — PROGESTERONE 100 MG CAPSULE: 100 | 30 days supply | Qty: 30 | Fill #0

## 2016-05-17 MED FILL — SIMVASTATIN 40 MG TABLET: 40 | 30 days supply | Qty: 30 | Fill #0

## 2016-05-27 DIAGNOSIS — Z1321 Encounter for screening for nutritional disorder: Secondary | ICD-10-CM | POA: Diagnosis not present

## 2016-05-27 DIAGNOSIS — Z6833 Body mass index (BMI) 33.0-33.9, adult: Secondary | ICD-10-CM | POA: Diagnosis not present

## 2016-05-27 DIAGNOSIS — Z01419 Encounter for gynecological examination (general) (routine) without abnormal findings: Secondary | ICD-10-CM | POA: Diagnosis not present

## 2016-05-27 DIAGNOSIS — Z1231 Encounter for screening mammogram for malignant neoplasm of breast: Secondary | ICD-10-CM | POA: Diagnosis not present

## 2016-05-27 MED FILL — MINIVELLE 0.075 MG PATCH: 0.075 | 28 days supply | Qty: 8 | Fill #0

## 2016-05-30 MED FILL — SERTRALINE HCL 100 MG TAB: 100 | 90 days supply | Qty: 90 | Fill #0

## 2016-05-31 DIAGNOSIS — E782 Mixed hyperlipidemia: Secondary | ICD-10-CM | POA: Diagnosis not present

## 2016-05-31 DIAGNOSIS — F419 Anxiety disorder, unspecified: Secondary | ICD-10-CM | POA: Diagnosis not present

## 2016-05-31 DIAGNOSIS — J309 Allergic rhinitis, unspecified: Secondary | ICD-10-CM | POA: Diagnosis not present

## 2016-05-31 DIAGNOSIS — Z96641 Presence of right artificial hip joint: Secondary | ICD-10-CM | POA: Diagnosis not present

## 2016-05-31 DIAGNOSIS — B1920 Unspecified viral hepatitis C without hepatic coma: Secondary | ICD-10-CM | POA: Diagnosis not present

## 2016-06-16 MED FILL — SIMVASTATIN 40 MG TABLET: 40 | 90 days supply | Qty: 90 | Fill #0 | Status: TO

## 2016-06-16 MED FILL — PROGESTERONE 100 MG CAPSULE: 100 | 30 days supply | Qty: 30 | Fill #0 | Status: TO

## 2016-06-27 MED FILL — MINIVELLE 0.075 MG PATCH: 0.075 | 28 days supply | Qty: 8 | Fill #1

## 2016-07-15 MED FILL — PROGESTERONE 100 MG CAPSULE: 100 | 30 days supply | Qty: 30 | Fill #0

## 2016-07-27 MED FILL — MINIVELLE 0.075 MG PATCH: 0.075 | 28 days supply | Qty: 8 | Fill #2

## 2016-08-15 MED FILL — PROGESTERONE 100 MG CAPSULE: 100 | 30 days supply | Qty: 30 | Fill #1

## 2016-08-23 MED FILL — MINIVELLE 0.075 MG PATCH: 0.075 | 28 days supply | Qty: 8 | Fill #3

## 2016-09-05 MED FILL — SERTRALINE HCL 100 MG TAB: 100 | 90 days supply | Qty: 90 | Fill #0

## 2016-09-05 MED FILL — PROGESTERONE 100 MG CAPSULE: 100 | 30 days supply | Qty: 30 | Fill #2

## 2016-09-05 MED FILL — SIMVASTATIN 40 MG TABLET: 40 | 90 days supply | Qty: 90 | Fill #0

## 2016-09-19 MED FILL — MINIVELLE 0.075 MG PATCH: 0.075 | 28 days supply | Qty: 8 | Fill #4

## 2016-10-13 MED FILL — PROGESTERONE 100 MG CAPSULE: 100 | 30 days supply | Qty: 30 | Fill #3

## 2016-10-13 MED FILL — MINIVELLE 0.075 MG PATCH: 0.075 | 28 days supply | Qty: 8 | Fill #5

## 2016-11-14 MED FILL — PROGESTERONE 100 MG CAPSULE: 100 | 30 days supply | Qty: 30 | Fill #4

## 2016-11-14 MED FILL — MINIVELLE 0.075 MG PATCH: 0.075 | 28 days supply | Qty: 8 | Fill #6

## 2016-12-01 MED FILL — SERTRALINE HCL 100 MG TAB: 100 | 90 days supply | Qty: 90 | Fill #1

## 2016-12-02 DIAGNOSIS — J309 Allergic rhinitis, unspecified: Secondary | ICD-10-CM | POA: Diagnosis not present

## 2016-12-02 DIAGNOSIS — E782 Mixed hyperlipidemia: Secondary | ICD-10-CM | POA: Diagnosis not present

## 2016-12-02 DIAGNOSIS — Z96641 Presence of right artificial hip joint: Secondary | ICD-10-CM | POA: Diagnosis not present

## 2016-12-02 DIAGNOSIS — F419 Anxiety disorder, unspecified: Secondary | ICD-10-CM | POA: Diagnosis not present

## 2016-12-02 DIAGNOSIS — Z8619 Personal history of other infectious and parasitic diseases: Secondary | ICD-10-CM | POA: Diagnosis not present

## 2016-12-02 DIAGNOSIS — Z6834 Body mass index (BMI) 34.0-34.9, adult: Secondary | ICD-10-CM | POA: Diagnosis not present

## 2016-12-08 ENCOUNTER — Encounter (INDEPENDENT_AMBULATORY_CARE_PROVIDER_SITE_OTHER): Payer: 59 | Admitting: Family Medicine

## 2016-12-13 MED FILL — SIMVASTATIN 40 MG TABLET: 40 | 90 days supply | Qty: 90 | Fill #0

## 2016-12-13 MED FILL — MINIVELLE 0.075 MG PATCH: 0.075 | 28 days supply | Qty: 8 | Fill #7

## 2016-12-13 MED FILL — PROGESTERONE 100 MG CAPSULE: 100 | 30 days supply | Qty: 30 | Fill #5

## 2016-12-27 DIAGNOSIS — Z1321 Encounter for screening for nutritional disorder: Secondary | ICD-10-CM | POA: Diagnosis not present

## 2016-12-27 DIAGNOSIS — N951 Menopausal and female climacteric states: Secondary | ICD-10-CM | POA: Diagnosis not present

## 2016-12-27 DIAGNOSIS — R5383 Other fatigue: Secondary | ICD-10-CM | POA: Diagnosis not present

## 2016-12-27 MED FILL — MINIVELLE 0.1 MG PATCH: 0.1 | 28 days supply | Qty: 8 | Fill #0

## 2016-12-30 MED FILL — VIT D2 1.25 MG (50,000 UNIT: 1.25 MG | 84 days supply | Qty: 12 | Fill #0

## 2017-01-02 ENCOUNTER — Encounter (INDEPENDENT_AMBULATORY_CARE_PROVIDER_SITE_OTHER): Payer: Self-pay | Admitting: Family Medicine

## 2017-01-02 ENCOUNTER — Ambulatory Visit (INDEPENDENT_AMBULATORY_CARE_PROVIDER_SITE_OTHER): Payer: 59 | Admitting: Family Medicine

## 2017-01-02 VITALS — BP 105/70 | Temp 98.2°F | Resp 16 | Ht 67.0 in | Wt 216.0 lb

## 2017-01-02 DIAGNOSIS — R0602 Shortness of breath: Secondary | ICD-10-CM | POA: Insufficient documentation

## 2017-01-02 DIAGNOSIS — E669 Obesity, unspecified: Secondary | ICD-10-CM | POA: Diagnosis not present

## 2017-01-02 DIAGNOSIS — E785 Hyperlipidemia, unspecified: Secondary | ICD-10-CM | POA: Diagnosis not present

## 2017-01-02 DIAGNOSIS — Z1389 Encounter for screening for other disorder: Secondary | ICD-10-CM | POA: Diagnosis not present

## 2017-01-02 DIAGNOSIS — Z1331 Encounter for screening for depression: Secondary | ICD-10-CM

## 2017-01-02 DIAGNOSIS — R9431 Abnormal electrocardiogram [ECG] [EKG]: Secondary | ICD-10-CM | POA: Diagnosis not present

## 2017-01-02 DIAGNOSIS — R5383 Other fatigue: Secondary | ICD-10-CM | POA: Diagnosis not present

## 2017-01-02 DIAGNOSIS — Z0289 Encounter for other administrative examinations: Secondary | ICD-10-CM

## 2017-01-02 DIAGNOSIS — E559 Vitamin D deficiency, unspecified: Secondary | ICD-10-CM | POA: Insufficient documentation

## 2017-01-02 DIAGNOSIS — Z6833 Body mass index (BMI) 33.0-33.9, adult: Secondary | ICD-10-CM

## 2017-01-02 DIAGNOSIS — Z9189 Other specified personal risk factors, not elsewhere classified: Secondary | ICD-10-CM | POA: Diagnosis not present

## 2017-01-02 NOTE — Progress Notes (Signed)
Office: [phone]  /  Fax: [phone]   HPI:   Chief Complaint: Whitney Davenport (MR# [id number]) is a 51 y.o. female who presents on 01/02/2017 for obesity evaluation and treatment. Current BMI is Body mass index is 33.83 kg/m.Marland Kitchen Whitney Davenport has struggled with obesity for years and has been unsuccessful in either losing weight or maintaining long term weight loss. Whitney Davenport attended our information session and states she is currently in the action stage of change and ready to dedicate time achieving and maintaining a healthier weight.  Whitney Davenport states her family eats meals together she thinks her family will eat healthier with  her she struggles with family and or coworkers weight loss sabotage her desired weight loss is 65 lbs she started gaining weight 7 years ago her heaviest weight ever was 215 lbs. she has significant food cravings issues  she snacks frequently in the evenings she skips meals frequently she is frequently drinking liquids with calories she frequently makes poor food choices she struggles with emotional eating    Fatigue Whitney Davenport feels her energy is lower than it should be. This has worsened with weight gain and has not worsened recently. Whitney Davenport admits to daytime somnolence and  admits to waking up still tired. Patient is at risk for obstructive sleep apnea. Patent has a history of symptoms of daytime fatigue and Epworth sleepiness scale. Patient generally gets 8 hours of sleep per night, and states they generally have generally restful sleep. Snoring is present. Apneic episodes are present. Epworth Sleepiness Score is 10  Dyspnea on exertion Whitney Davenport notes increasing shortness of breath with exercising and seems to be worsening over time with weight gain. She notes getting out of breath sooner with activity than she used to. This has not gotten worse recently. Whitney Davenport denies orthopnea.  Hyperlipidemia Whitney Davenport has hyperlipidemia and has been  trying to improve her cholesterol levels with intensive lifestyle modification including a low saturated fat diet, exercise and weight loss. She is currently on Simvastatin. She denies any chest pain, claudication or myalgias.  At risk for cardiovascular disease Whitney Davenport is at a higher than average risk for cardiovascular disease due to obesity. She currently denies any chest pain.  Abnormal EKG Whitney Davenport has Trigeminy PVC and has had a history of this for many years, worked up by Cardiology and patient was told it was normal.  Vitamin D deficiency Whitney Davenport has a diagnosis of vitamin D deficiency. She was started on weekly vitamin D by her OB GYN  and denies nausea, vomiting or muscle weakness.  Depression Screen Whitney Davenport Food and Mood (modified PHQ-9) score was  Depression screen PHQ 2/9 01/02/2017  Decreased Interest 1  Down, Depressed, Hopeless 1  PHQ - 2 Score 2  Altered sleeping 2  Tired, decreased energy 3  Change in appetite 2  Feeling bad or failure about yourself  1  Trouble concentrating 1  Moving slowly or fidgety/restless 0  Suicidal thoughts 0  PHQ-9 Score 11    ALLERGIES: Allergies  Allergen Reactions  . Sulfa Antibiotics Nausea And Vomiting and Other (See Comments)    Causes headache with nausea  . Hydrocodone Itching and Rash  . Zithromax [Azithromycin] Itching and Rash    MEDICATIONS: Current Outpatient Prescriptions on File Prior to Visit  Medication Sig Dispense Refill  . cetirizine (ZYRTEC) 10 MG tablet Take 10 mg by mouth at bedtime.     . clonazePAM (KLONOPIN) 0.5 MG tablet Take 0.5 mg by mouth 2 (two) times daily as needed for  anxiety.     Marland Kitchen estradiol (VIVELLE-DOT) 0.075 MG/24HR Place 1 patch onto the skin 2 (two) times a week.    . progesterone (PROMETRIUM) 100 MG capsule Take 100 mg by mouth at bedtime.    . sertraline (ZOLOFT) 100 MG tablet Take 100 mg by mouth at bedtime.     . simvastatin (ZOCOR) 40 MG tablet Take 40 mg by mouth at bedtime.       No current facility-administered medications on file prior to visit.     PAST MEDICAL HISTORY: Past Medical History:  Diagnosis Date  . Allergy   . Anemia    hx of  . Anxiety   . Back injury    "ruptured disc-no problems since surgery"  . Back pain   . Constipation   . Depression   . GERD (gastroesophageal reflux disease)    occ  . Hepatitis 2004   hx of hep c-"interferon treatment, does not show up on blood test anymore"  . History of kidney stones   . Hyperlipidemia   . Hypotension    no fainting in years  . Infertility, female   . Lumbar disc disease   . Osteoarthritis   . Vitamin D deficiency     PAST SURGICAL HISTORY: Past Surgical History:  Procedure Laterality Date  . DIAGNOSTIC LAPAROSCOPY  last 2001   for endometriosis, couple of surgeries  . GANGLION CYST EXCISION  age 56  . LUMBAR LAMINECTOMY/DECOMPRESSION MICRODISCECTOMY  11/03/2011   Procedure: LUMBAR LAMINECTOMY/DECOMPRESSION MICRODISCECTOMY;  Surgeon: Hosie Spangle, MD;  Location: [facility];  Service: Neurosurgery;  Laterality: Left;  LEFT Lumbar five sacral one laminotomy and microdiskectomy  . TOTAL HIP ARTHROPLASTY Right 02/03/2015   Procedure: RIGHT TOTAL HIP ARTHROPLASTY ANTERIOR APPROACH;  Surgeon: Paralee Cancel, MD;  Location: [facility];  Service: Orthopedics;  Laterality: Right;    SOCIAL HISTORY: Social History  Substance Use Topics  . Smoking status: Former Smoker    Years: 15.00    Types: Cigarettes    Quit date: 10/31/2010  . Smokeless tobacco: Never Used  . Alcohol use Yes     Comment: rare    FAMILY HISTORY: Family History  Problem Relation Age of Onset  . Thyroid disease Mother   . Diabetes Father   . Hyperlipidemia Father   . Obesity Father   . Cancer Maternal Grandmother     ROS: Review of Systems  Constitutional: Positive for malaise/fatigue.  Eyes:       Wear Glasses or Contacts  Respiratory: Positive for shortness of breath (on exertion).   Cardiovascular:  Negative for chest pain and claudication.  Gastrointestinal: Positive for constipation and heartburn. Negative for nausea and vomiting.  Musculoskeletal: Negative for myalgias.       Negative muscle weakness    PHYSICAL EXAM: Blood pressure 105/70, temperature 98.2 F (36.8 C), temperature source Oral, resp. rate 16, height 5\' 7"  (1.702 m), weight 216 lb (98 kg), last menstrual period 11/21/2016, SpO2 97 %. Body mass index is 33.83 kg/m. Physical Exam  Constitutional: She is oriented to person, place, and time. She appears well-developed and well-nourished.  Cardiovascular: Normal rate.   Pulmonary/Chest: Effort normal.  Musculoskeletal: Normal range of motion.  Neurological: She is oriented to person, place, and time.  Skin: Skin is warm and dry.  Psychiatric: She has a normal mood and affect. Her behavior is normal.  Vitals reviewed.   RECENT LABS AND TESTS: BMET    Component Value Date/Time   NA 139 02/04/2015 0519  K 4.0 02/04/2015 0519   CL 107 02/04/2015 0519   CO2 25 02/04/2015 0519   GLUCOSE 110 (H) 02/04/2015 0519   BUN 11 02/04/2015 0519   CREATININE 0.87 02/04/2015 0519   CALCIUM 8.2 (L) 02/04/2015 0519   GFRNONAA 76 (L) 02/04/2015 0519   GFRAA 89 (L) 02/04/2015 0519   No results found for: HGBA1C No results found for: INSULIN CBC    Component Value Date/Time   WBC 13.1 (H) 02/04/2015 0519   RBC 3.71 (L) 02/04/2015 0519   HGB 10.2 (L) 02/04/2015 0519   HCT 33.3 (L) 02/04/2015 0519   PLT 226 02/04/2015 0519   MCV 89.8 02/04/2015 0519   MCH 27.5 02/04/2015 0519   MCHC 30.6 02/04/2015 0519   RDW 14.6 02/04/2015 0519   Iron/TIBC/Ferritin/ %Sat No results found for: IRON, TIBC, FERRITIN, IRONPCTSAT Lipid Panel  No results found for: CHOL, TRIG, HDL, CHOLHDL, VLDL, LDLCALC, LDLDIRECT Hepatic Function Panel     Component Value Date/Time   PROT 7.9 01/27/2015 1340   ALBUMIN 4.2 01/27/2015 1340   AST 18 01/27/2015 1340   ALT 13 01/27/2015 1340    ALKPHOS 72 01/27/2015 1340   BILITOT 0.2 (L) 01/27/2015 1340   No results found for: TSH  ECG  shows NSR with a rate of 74 BPM INDIRECT CALORIMETER done today shows a VO2 of 208 and a REE of 1449.    ASSESSMENT AND PLAN: Other fatigue - Plan: EKG 12-Lead, Vitamin B12, CBC With Differential, Comprehensive metabolic panel, Folate, Hemoglobin A1c, Insulin, random, T3, T4, free, TSH, Magnesium  Shortness of breath on exertion  Hyperlipidemia, unspecified hyperlipidemia type - Plan: Lipid Panel With LDL/HDL Ratio  Vitamin D deficiency - Plan: VITAMIN D 25 Hydroxy (Vit-D Deficiency, Fractures)  Abnormal EKG  Depression screening  At risk for heart disease  Class 1 obesity without serious comorbidity with body mass index (BMI) of 33.0 to 33.9 in adult, unspecified obesity type  PLAN:  Fatigue Zamiya was informed that her fatigue may be related to obesity, depression or many other causes. Labs will be ordered, and in the meanwhile Laniesha has agreed to work on diet, exercise and weight loss to help with fatigue. Proper sleep hygiene was discussed including the need for 7-8 hours of quality sleep each night. A sleep study was not ordered based on symptoms and Epworth score.  Dyspnea on exertion Jenafer's shortness of breath appears to be obesity related and exercise induced. She has agreed to work on weight loss and gradually increase exercise to treat her exercise induced shortness of breath. If Marilyne follows our instructions and loses weight without improvement of her shortness of breath, we will plan to refer to pulmonology. We will monitor this condition regularly. Margi agrees to this plan.  Hyperlipidemia Rasheda was informed of the American Heart Association Guidelines emphasizing intensive lifestyle modifications as the first line treatment for hyperlipidemia. We discussed many lifestyle modifications today in depth, and Bethany will continue to work on  decreasing saturated fats such as fatty red meat, butter and many fried foods. She will also increase vegetables and lean protein in her diet and continue to work on exercise and weight loss efforts. She will continue medications as directed and we will check labs and follow.  Cardiovascular risk counselling Zaharah was given extended (at least 15 minutes) coronary artery disease prevention counseling today. She is 51 y.o. female and has risk factors for heart disease including obesity. We discussed intensive lifestyle modifications today with an emphasis on  specific weight loss instructions and strategies. Pt was also informed of the importance of increasing exercise and decreasing saturated fats to help prevent heart disease.  Abnormal EKG Lasya agreed to work on diet and exercise and to notify us if palpitations start. We will consider this result when we discuss medications at her next appointment in 2 weeks.  Vitamin D Deficiency Sheyenne was informed that low vitamin D levels contributes to fatigue and are associated with obesity, breast, and colon cancer. She agrees to continue to take prescription Vit D @50 ,000 IU every week and we will check labs and will follow up for routine testing of vitamin D, at least 2-3 times per year. She was informed of the risk of over-replacement of vitamin D and agrees to not increase her dose unless he discusses this with [place] first. She will follow up with our clinic in 2 weeks.  Depression Screen Ivania had a moderately positive depression screening. Depression is commonly associated with obesity and often results in emotional eating behaviors. We will monitor this closely and work on CBT to help improve the non-hunger eating patterns. Referral to Psychology may be required if no improvement is seen as she continues in our clinic.  Obesity Tarynn is currently in the action stage of change and her goal is to continue with weight loss efforts She has  agreed to follow the Category 2 plan +100 calories Arriona has been instructed to work up to a goal of 150 minutes of combined cardio and strengthening exercise per week for weight loss and overall health benefits. We discussed the following Behavioral Modification Stratagies today: increasing lean protein intake and increasing lower sugar fruits  Rondia has agreed to follow up with our clinic in 2 weeks. She was informed of the importance of frequent follow up visits to maximize her success with intensive lifestyle modifications for her multiple health conditions. She was informed we would discuss her lab results at her next visit unless there is a critical issue that needs to be addressed sooner. Fallynn agreed to keep her next visit at the agreed upon time to discuss these results.  I, Doreene Nest, am acting as scribe for Dennard Nip, MD  I have reviewed the above documentation for accuracy and completeness, and I agree with the above. -Dennard Nip, MD

## 2017-01-03 LAB — COMPREHENSIVE METABOLIC PANEL
ALBUMIN: 4.1 g/dL (ref 3.5–5.5)
ALT: 10 IU/L (ref 0–32)
AST: 12 IU/L (ref 0–40)
Albumin/Globulin Ratio: 1.3 (ref 1.2–2.2)
Alkaline Phosphatase: 92 IU/L (ref 39–117)
BUN / CREAT RATIO: 14 (ref 9–23)
BUN: 13 mg/dL (ref 6–24)
CO2: 25 mmol/L (ref 18–29)
Calcium: 9 mg/dL (ref 8.7–10.2)
Chloride: 102 mmol/L (ref 96–106)
Creatinine, Ser: 0.94 mg/dL (ref 0.57–1.00)
GFR calc Af Amer: 81 mL/min/{1.73_m2} (ref >59)
GFR calc non Af Amer: 70 mL/min/{1.73_m2} (ref >59)
GLOBULIN, TOTAL: 3.1 g/dL (ref 1.5–4.5)
Glucose: 103 mg/dL — ABNORMAL HIGH (ref 65–99)
Potassium: 4.7 mmol/L (ref 3.5–5.2)
SODIUM: 142 mmol/L (ref 134–144)
Total Protein: 7.2 g/dL (ref 6.0–8.5)

## 2017-01-03 LAB — CBC WITH DIFFERENTIAL
BASOS ABS: 0 10*3/uL (ref 0.0–0.2)
Basos: 0 %
EOS (ABSOLUTE): 0.1 10*3/uL (ref 0.0–0.4)
EOS: 2 %
HEMOGLOBIN: 11.6 g/dL (ref 11.1–15.9)
Hematocrit: 37.1 % (ref 34.0–46.6)
IMMATURE GRANS (ABS): 0 10*3/uL (ref 0.0–0.1)
Immature Granulocytes: 0 %
LYMPHS ABS: 1.9 10*3/uL (ref 0.7–3.1)
Lymphs: 28 %
MCH: 26.9 pg (ref 26.6–33.0)
MCHC: 31.3 g/dL — AB (ref 31.5–35.7)
MCV: 86 fL (ref 79–97)
Monocytes Absolute: 0.4 10*3/uL (ref 0.1–0.9)
Monocytes: 5 %
NEUTROS ABS: 4.4 10*3/uL (ref 1.4–7.0)
Neutrophils: 65 %
RBC: 4.32 x10E6/uL (ref 3.77–5.28)
RDW: 15.2 % (ref 12.3–15.4)
WBC: 6.8 10*3/uL (ref 3.4–10.8)

## 2017-01-03 LAB — VITAMIN B12: Vitamin B-12: 176 pg/mL — ABNORMAL LOW (ref 232–1245)

## 2017-01-03 LAB — LIPID PANEL WITH LDL/HDL RATIO
Cholesterol, Total: 181 mg/dL (ref 100–199)
HDL: 57 mg/dL (ref >39)
LDL CALC: 99 mg/dL (ref 0–99)
LDl/HDL Ratio: 1.7 ratio units (ref 0.0–3.2)
Triglycerides: 125 mg/dL (ref 0–149)
VLDL CHOLESTEROL CAL: 25 mg/dL (ref 5–40)

## 2017-01-03 LAB — FOLATE: Folate: 7.6 ng/mL (ref >3.0)

## 2017-01-03 LAB — TSH: TSH: 4.28 u[IU]/mL (ref 0.450–4.500)

## 2017-01-03 LAB — INSULIN, RANDOM: INSULIN: 25 u[IU]/mL — ABNORMAL HIGH (ref 2.6–24.9)

## 2017-01-03 LAB — T4, FREE: FREE T4: 0.96 ng/dL (ref 0.82–1.77)

## 2017-01-03 LAB — T3: T3, Total: 113 ng/dL (ref 71–180)

## 2017-01-03 LAB — VITAMIN D 25 HYDROXY (VIT D DEFICIENCY, FRACTURES): Vit D, 25-Hydroxy: 23.3 ng/mL — ABNORMAL LOW (ref 30.0–100.0)

## 2017-01-03 LAB — HEMOGLOBIN A1C
Est. average glucose Bld gHb Est-mCnc: 123 mg/dL
Hgb A1c MFr Bld: 5.9 % — ABNORMAL HIGH (ref 4.8–5.6)

## 2017-01-03 LAB — MAGNESIUM: Magnesium: 2.3 mg/dL (ref 1.6–2.3)

## 2017-01-16 MED FILL — clonazePAM 0.5 MG TABS: 0.5 | 15 days supply | Qty: 15 | Fill #0

## 2017-01-17 ENCOUNTER — Ambulatory Visit (INDEPENDENT_AMBULATORY_CARE_PROVIDER_SITE_OTHER): Payer: 59 | Admitting: Family Medicine

## 2017-01-17 ENCOUNTER — Encounter (INDEPENDENT_AMBULATORY_CARE_PROVIDER_SITE_OTHER): Payer: Self-pay | Admitting: Family Medicine

## 2017-01-17 VITALS — BP 109/74 | HR 73 | Temp 97.8°F | Resp 14 | Ht 67.0 in | Wt 208.0 lb

## 2017-01-17 DIAGNOSIS — E559 Vitamin D deficiency, unspecified: Secondary | ICD-10-CM | POA: Diagnosis not present

## 2017-01-17 DIAGNOSIS — E669 Obesity, unspecified: Secondary | ICD-10-CM

## 2017-01-17 DIAGNOSIS — Z6832 Body mass index (BMI) 32.0-32.9, adult: Secondary | ICD-10-CM

## 2017-01-17 DIAGNOSIS — R7303 Prediabetes: Secondary | ICD-10-CM

## 2017-01-17 DIAGNOSIS — E66811 Obesity, class 1: Secondary | ICD-10-CM | POA: Insufficient documentation

## 2017-01-17 DIAGNOSIS — E538 Deficiency of other specified B group vitamins: Secondary | ICD-10-CM

## 2017-01-17 DIAGNOSIS — Z9189 Other specified personal risk factors, not elsewhere classified: Secondary | ICD-10-CM

## 2017-01-17 MED ORDER — METFORMIN HCL 500 MG PO TABS: 500.0000 mg | ORAL_TABLET | Freq: Every morning | ORAL | 0 refills | Status: DC

## 2017-01-17 MED ORDER — VITAMIN B-12 1000 MCG PO TABS
1000.0000 ug | ORAL_TABLET | Freq: Every day | ORAL | 0 refills | Status: DC
Start: 2017-01-17 — End: 2017-10-03

## 2017-01-17 MED FILL — metFORMIN HCL 500 MG TABS: 500 | 30 days supply | Qty: 30 | Fill #0

## 2017-01-17 NOTE — Progress Notes (Signed)
Office: [phone]  /  Fax: [phone]   HPI:   Chief Complaint: OBESITY Whitney Davenport is here to discuss her progress with her obesity treatment plan. She is following her eating plan approximately 80 % of the time and states she is exercising 0 minutes 0 times per week. Whitney Davenport has done well with weight loss, did well with breakfast and lunch but very tired of dinner. She struggled to follow the plan on weekends. She noted increased polyphagia in the early evenings. Her weight is 208 lb (94.3 kg) today and has had a weight loss of 8 pounds over a period of 2 weeks since her last visit. She has lost 8 lbs since starting treatment with [place].  Vitamin D deficiency Whitney Davenport has a diagnosis of vitamin D deficiency. She is currently taking prescription vit D weekly, not yet at goal and denies nausea, vomiting or muscle weakness.  B12 Deficiency Whitney Davenport has a diagnosis of B12 insufficiency, B12 is very low and notes fatigue. She is not eating much meat in the last few years. This is a new diagnosis. Whitney Davenport is not a vegetarian and does not have a previous diagnosis of pernicious anemia. She has no anemia and normal MVC, so unlikely to be related to pernicious anemia or absorption issues. does not have a history of weight loss surgery.   Pre-Diabetes Whitney Davenport has a diagnosis of prediabetes based on her elevated Hgb A1c, fasting glucose and fasting insulin and was informed this puts her at greater risk of developing diabetes. She is not taking metformin currently and continues to work on diet and exercise to decrease risk of diabetes. She admits polyphagia and denies nausea or hypoglycemia.   At risk for diabetes Whitney Davenport is at higher than average risk for developing diabetes due to her pre-diabetes, family history and her obesity. She currently denies polyuria or polydipsia.   Wt Readings from Last 500 Encounters:  01/17/17 208 lb (94.3 kg)  01/02/17 216 lb (98 kg)  02/03/15 207 lb  (93.9 kg)  01/27/15 207 lb (93.9 kg)  10/24/14 206 lb (93.4 kg)  11/01/11 194 lb (88 kg)  09/12/11 195 lb (88.5 kg)     ALLERGIES: Allergies  Allergen Reactions  . Sulfa Antibiotics Nausea And Vomiting and Other (See Comments)    Causes headache with nausea  . Hydrocodone Itching and Rash  . Zithromax [Azithromycin] Itching and Rash    MEDICATIONS: Current Outpatient Prescriptions on File Prior to Visit  Medication Sig Dispense Refill  . cetirizine (ZYRTEC) 10 MG tablet Take 10 mg by mouth at bedtime.     . clonazePAM (KLONOPIN) 0.5 MG tablet Take 0.5 mg by mouth 2 (two) times daily as needed for anxiety.     Marland Kitchen estradiol (VIVELLE-DOT) 0.075 MG/24HR Place 1 patch onto the skin 2 (two) times a week.    . progesterone (PROMETRIUM) 100 MG capsule Take 100 mg by mouth at bedtime.    . sertraline (ZOLOFT) 100 MG tablet Take 100 mg by mouth at bedtime.     . simvastatin (ZOCOR) 40 MG tablet Take 40 mg by mouth at bedtime.     . Vitamin D, Ergocalciferol, (DRISDOL) 50000 units CAPS capsule Take 50,000 Units by mouth every 7 (seven) days.     No current facility-administered medications on file prior to visit.     PAST MEDICAL HISTORY: Past Medical History:  Diagnosis Date  . Allergy   . Anemia    hx of  . Anxiety   . Back injury    "  ruptured disc-no problems since surgery"  . Back pain   . Constipation   . Depression   . GERD (gastroesophageal reflux disease)    occ  . Hepatitis 2004   hx of hep c-"interferon treatment, does not show up on blood test anymore"  . History of kidney stones   . Hyperlipidemia   . Hypotension    no fainting in years  . Infertility, female   . Lumbar disc disease   . Osteoarthritis   . Vitamin D deficiency     PAST SURGICAL HISTORY: Past Surgical History:  Procedure Laterality Date  . DIAGNOSTIC LAPAROSCOPY  last 2001   for endometriosis, couple of surgeries  . GANGLION CYST EXCISION  age 62  . LUMBAR LAMINECTOMY/DECOMPRESSION  MICRODISCECTOMY  11/03/2011   Procedure: LUMBAR LAMINECTOMY/DECOMPRESSION MICRODISCECTOMY;  Surgeon: Hosie Spangle, MD;  Location: [facility];  Service: Neurosurgery;  Laterality: Left;  LEFT Lumbar five sacral one laminotomy and microdiskectomy  . TOTAL HIP ARTHROPLASTY Right 02/03/2015   Procedure: RIGHT TOTAL HIP ARTHROPLASTY ANTERIOR APPROACH;  Surgeon: Paralee Cancel, MD;  Location: [facility];  Service: Orthopedics;  Laterality: Right;    SOCIAL HISTORY: Social History  Substance Use Topics  . Smoking status: Former Smoker    Years: 15.00    Types: Cigarettes    Quit date: 10/31/2010  . Smokeless tobacco: Never Used  . Alcohol use Yes     Comment: rare    FAMILY HISTORY: Family History  Problem Relation Age of Onset  . Thyroid disease Mother   . Diabetes Father   . Hyperlipidemia Father   . Obesity Father   . Cancer Maternal Grandmother     ROS: Review of Systems  Constitutional: Positive for malaise/fatigue and weight loss.  Gastrointestinal: Negative for nausea and vomiting.  Genitourinary: Negative for frequency.  Musculoskeletal:       Negative muscle weakness  Endo/Heme/Allergies: Negative for polydipsia.       Polyphagia Negative hypoglycemia    PHYSICAL EXAM: Blood pressure 109/74, pulse 73, temperature 97.8 F (36.6 C), temperature source Oral, resp. rate 14, height 5\' 7"  (1.702 m), weight 208 lb (94.3 kg), SpO2 99 %. Body mass index is 32.58 kg/m. Physical Exam  Constitutional: She is oriented to person, place, and time. She appears well-developed and well-nourished.  Cardiovascular: Normal rate.   Pulmonary/Chest: Effort normal.  Neurological: She is oriented to person, place, and time.  Skin: Skin is warm and dry.  Psychiatric: She has a normal mood and affect. Her behavior is normal.  Vitals reviewed.   RECENT LABS AND TESTS: BMET    Component Value Date/Time   NA 142 01/02/2017 1007   K 4.7 01/02/2017 1007   CL 102 01/02/2017 1007   CO2 25  01/02/2017 1007   GLUCOSE 103 (H) 01/02/2017 1007   GLUCOSE 110 (H) 02/04/2015 0519   BUN 13 01/02/2017 1007   CREATININE 0.94 01/02/2017 1007   CALCIUM 9.0 01/02/2017 1007   GFRNONAA 70 01/02/2017 1007   GFRAA 81 01/02/2017 1007   Lab Results  Component Value Date   HGBA1C 5.9 (H) 01/02/2017   Lab Results  Component Value Date   INSULIN 25.0 (H) 01/02/2017   CBC    Component Value Date/Time   WBC 6.8 01/02/2017 1007   WBC 13.1 (H) 02/04/2015 0519   RBC 4.32 01/02/2017 1007   RBC 3.71 (L) 02/04/2015 0519   HGB 10.2 (L) 02/04/2015 0519   HCT 37.1 01/02/2017 1007   PLT 226 02/04/2015 0519  MCV 86 01/02/2017 1007   MCH 26.9 01/02/2017 1007   MCH 27.5 02/04/2015 0519   MCHC 31.3 (L) 01/02/2017 1007   MCHC 30.6 02/04/2015 0519   RDW 15.2 01/02/2017 1007   LYMPHSABS 1.9 01/02/2017 1007   EOSABS 0.1 01/02/2017 1007   BASOSABS 0.0 01/02/2017 1007   Iron/TIBC/Ferritin/ %Sat No results found for: IRON, TIBC, FERRITIN, IRONPCTSAT Lipid Panel     Component Value Date/Time   CHOL 181 01/02/2017 1007   TRIG 125 01/02/2017 1007   HDL 57 01/02/2017 1007   LDLCALC 99 01/02/2017 1007   Hepatic Function Panel     Component Value Date/Time   PROT 7.2 01/02/2017 1007   ALBUMIN 4.1 01/02/2017 1007   AST 12 01/02/2017 1007   ALT 10 01/02/2017 1007   ALKPHOS 92 01/02/2017 1007   BILITOT <0.2 01/02/2017 1007      Component Value Date/Time   TSH 4.280 01/02/2017 1007    ASSESSMENT AND PLAN: Prediabetes - Plan: metFORMIN (GLUCOPHAGE) 500 MG tablet  Vitamin D deficiency  B12 nutritional deficiency - Plan: vitamin B-12 (CYANOCOBALAMIN) 1000 MCG tablet  At risk for diabetes mellitus  Class 1 obesity without serious comorbidity with body mass index (BMI) of 32.0 to 32.9 in adult, unspecified obesity type  PLAN:  Vitamin D Deficiency Whitney Davenport was informed that low vitamin D levels contributes to fatigue and are associated with obesity, breast, and colon cancer. She  agrees to continue to take prescription Vit D @50 ,000 IU every week and we will re-check labs in 10 weeks and will follow up for routine testing of vitamin D, at least 2-3 times per year. She was informed of the risk of over-replacement of vitamin D and agrees to not increase her dose unless he discusses this with [place] first.  B12 Deficiency Whitney Davenport will work on increasing B12 rich foods in her diet. B12 supplementation was prescribed today, OTC B12 1,000 mg daily. We will plan on rechecking labs in the next 10 weeks.  Pre-Diabetes Whitney Davenport will continue to work on weight loss, exercise, and decreasing simple carbohydrates in her diet to help decrease the risk of diabetes. We dicussed metformin including benefits and risks. She was informed that eating too many simple carbohydrates or too many calories at one sitting increases the likelihood of GI side effects. Whitney Davenport requested metformin for now and a prescription was written today for metformin 500 mg every morning #30 with no refills. Whitney Davenport agreed to follow up with [place] as directed to monitor her progress.  Diabetes risk counselling Whitney Davenport was given extended (30 minutes) diabetes prevention counseling today. She is 51 y.o. female and has risk factors for diabetes including obesity. We discussed intensive lifestyle modifications today with an emphasis on weight loss as well as increasing exercise and decreasing simple carbohydrates in her diet.  Obesity Whitney Davenport is currently in the action stage of change. As such, her goal is to continue with weight loss efforts She has agreed to follow the Category 2 plan +100 calories Whitney Davenport has been instructed to work up to a goal of 150 minutes of combined cardio and strengthening exercise per week for weight loss and overall health benefits. We discussed the following Behavioral Modification Stratagies today: increasing lean protein intake, decreasing simple carbohydrates  and work on meal planning  and easy cooking plans  Whitney Davenport has agreed to follow up with our clinic in 2 weeks. She was informed of the importance of frequent follow up visits to maximize her success with intensive lifestyle modifications  for her multiple health conditions.  I, Doreene Nest, am acting as scribe for Dennard Nip, MD  I have reviewed the above documentation for accuracy and completeness, and I agree with the above. -Dennard Nip, MD

## 2017-01-18 MED FILL — MINIVELLE 0.075 MG PATCH: 0.075 | 28 days supply | Qty: 8 | Fill #8

## 2017-02-02 ENCOUNTER — Ambulatory Visit (INDEPENDENT_AMBULATORY_CARE_PROVIDER_SITE_OTHER): Payer: 59 | Admitting: Family Medicine

## 2017-02-02 VITALS — BP 108/74 | HR 71 | Temp 97.7°F | Ht 67.0 in | Wt 205.0 lb

## 2017-02-02 DIAGNOSIS — E669 Obesity, unspecified: Secondary | ICD-10-CM

## 2017-02-02 DIAGNOSIS — Z6832 Body mass index (BMI) 32.0-32.9, adult: Secondary | ICD-10-CM | POA: Diagnosis not present

## 2017-02-02 DIAGNOSIS — R7303 Prediabetes: Secondary | ICD-10-CM | POA: Diagnosis not present

## 2017-02-02 DIAGNOSIS — Z9189 Other specified personal risk factors, not elsewhere classified: Secondary | ICD-10-CM | POA: Diagnosis not present

## 2017-02-02 NOTE — Progress Notes (Signed)
Office: [phone]  /  Fax: [phone]   HPI:   Chief Complaint: OBESITY Whitney Davenport is here to discuss her progress with her obesity treatment plan. She is following her eating plan approximately 95 % of the time and states she is exercising 0 minutes 0 times per week. Whitney Davenport is doing very well with weight loss on the category 2 plan. Her polyphagia is improved and notes not eating as many snacks. She is bored with dinner and would like more options. Her weight is 205 lb (93 kg) today and has had a weight loss of 3 pounds over a period of 2 weeks since her last visit. She has lost 11 lbs since starting treatment with [place].  Pre-Diabetes Whitney Davenport has a diagnosis of prediabetes based on her elevated Hgb A1c and was informed this puts her at greater risk of developing diabetes. She started taking metformin and notes decreased polyphagia. She continues to work on diet and exercise to decrease risk of diabetes. She denies nausea, vomiting or hypoglycemia.  At risk for diabetes Whitney Davenport is at higher than average risk for developing diabetes due to her obesity and pre-diabetes. She currently denies polyuria or polydipsia.   Wt Readings from Last 500 Encounters:  02/02/17 205 lb (93 kg)  01/17/17 208 lb (94.3 kg)  01/02/17 216 lb (98 kg)  02/03/15 207 lb (93.9 kg)  01/27/15 207 lb (93.9 kg)  10/24/14 206 lb (93.4 kg)  11/01/11 194 lb (88 kg)  09/12/11 195 lb (88.5 kg)     ALLERGIES: Allergies  Allergen Reactions  . Sulfa Antibiotics Nausea And Vomiting and Other (See Comments)    Causes headache with nausea  . Hydrocodone Itching and Rash  . Zithromax [Azithromycin] Itching and Rash    MEDICATIONS: Current Outpatient Prescriptions on File Prior to Visit  Medication Sig Dispense Refill  . cetirizine (ZYRTEC) 10 MG tablet Take 10 mg by mouth at bedtime.     . clonazePAM (KLONOPIN) 0.5 MG tablet Take 0.5 mg by mouth 2 (two) times daily as needed for anxiety.     Marland Kitchen estradiol  (VIVELLE-DOT) 0.075 MG/24HR Place 1 patch onto the skin 2 (two) times a week.    . metFORMIN (GLUCOPHAGE) 500 MG tablet Take 1 tablet (500 mg total) by mouth every morning. 30 tablet 0  . progesterone (PROMETRIUM) 100 MG capsule Take 100 mg by mouth at bedtime.    . sertraline (ZOLOFT) 100 MG tablet Take 100 mg by mouth at bedtime.     . simvastatin (ZOCOR) 40 MG tablet Take 40 mg by mouth at bedtime.     . vitamin B-12 (CYANOCOBALAMIN) 1000 MCG tablet Take 1 tablet (1,000 mcg total) by mouth daily. 30 tablet 0  . Vitamin D, Ergocalciferol, (DRISDOL) 50000 units CAPS capsule Take 50,000 Units by mouth every 7 (seven) days.     No current facility-administered medications on file prior to visit.     PAST MEDICAL HISTORY: Past Medical History:  Diagnosis Date  . Allergy   . Anemia    hx of  . Anxiety   . Back injury    "ruptured disc-no problems since surgery"  . Back pain   . Constipation   . Depression   . GERD (gastroesophageal reflux disease)    occ  . Hepatitis 2004   hx of hep c-"interferon treatment, does not show up on blood test anymore"  . History of kidney stones   . Hyperlipidemia   . Hypotension    no fainting in  years  . Infertility, female   . Lumbar disc disease   . Osteoarthritis   . Vitamin D deficiency     PAST SURGICAL HISTORY: Past Surgical History:  Procedure Laterality Date  . DIAGNOSTIC LAPAROSCOPY  last 2001   for endometriosis, couple of surgeries  . GANGLION CYST EXCISION  age 44  . LUMBAR LAMINECTOMY/DECOMPRESSION MICRODISCECTOMY  11/03/2011   Procedure: LUMBAR LAMINECTOMY/DECOMPRESSION MICRODISCECTOMY;  Surgeon: Hosie Spangle, MD;  Location: [facility];  Service: Neurosurgery;  Laterality: Left;  LEFT Lumbar five sacral one laminotomy and microdiskectomy  . TOTAL HIP ARTHROPLASTY Right 02/03/2015   Procedure: RIGHT TOTAL HIP ARTHROPLASTY ANTERIOR APPROACH;  Surgeon: Paralee Cancel, MD;  Location: [facility];  Service: Orthopedics;  Laterality:  Right;    SOCIAL HISTORY: Social History  Substance Use Topics  . Smoking status: Former Smoker    Years: 15.00    Types: Cigarettes    Quit date: 10/31/2010  . Smokeless tobacco: Never Used  . Alcohol use Yes     Comment: rare    FAMILY HISTORY: Family History  Problem Relation Age of Onset  . Thyroid disease Mother   . Diabetes Father   . Hyperlipidemia Father   . Obesity Father   . Cancer Maternal Grandmother     ROS: Review of Systems  Constitutional: Positive for weight loss.  Gastrointestinal: Negative for nausea and vomiting.  Genitourinary: Negative for frequency.  Endo/Heme/Allergies: Negative for polydipsia.       Polyphagia Negative hypoglycemia    PHYSICAL EXAM: Blood pressure 108/74, pulse 71, temperature 97.7 F (36.5 C), temperature source Oral, height 5\' 7"  (1.702 m), weight 205 lb (93 kg), SpO2 98 %. Body mass index is 32.11 kg/m. Physical Exam  Constitutional: She is oriented to person, place, and time. She appears well-developed and well-nourished.  Cardiovascular: Normal rate.   Pulmonary/Chest: Effort normal.  Musculoskeletal: Normal range of motion.  Neurological: She is oriented to person, place, and time.  Skin: Skin is warm and dry.  Psychiatric: She has a normal mood and affect. Her behavior is normal.  Vitals reviewed.   RECENT LABS AND TESTS: BMET    Component Value Date/Time   NA 142 01/02/2017 1007   K 4.7 01/02/2017 1007   CL 102 01/02/2017 1007   CO2 25 01/02/2017 1007   GLUCOSE 103 (H) 01/02/2017 1007   GLUCOSE 110 (H) 02/04/2015 0519   BUN 13 01/02/2017 1007   CREATININE 0.94 01/02/2017 1007   CALCIUM 9.0 01/02/2017 1007   GFRNONAA 70 01/02/2017 1007   GFRAA 81 01/02/2017 1007   Lab Results  Component Value Date   HGBA1C 5.9 (H) 01/02/2017   Lab Results  Component Value Date   INSULIN 25.0 (H) 01/02/2017   CBC    Component Value Date/Time   WBC 6.8 01/02/2017 1007   WBC 13.1 (H) 02/04/2015 0519   RBC 4.32  01/02/2017 1007   RBC 3.71 (L) 02/04/2015 0519   HGB 10.2 (L) 02/04/2015 0519   HCT 37.1 01/02/2017 1007   PLT 226 02/04/2015 0519   MCV 86 01/02/2017 1007   MCH 26.9 01/02/2017 1007   MCH 27.5 02/04/2015 0519   MCHC 31.3 (L) 01/02/2017 1007   MCHC 30.6 02/04/2015 0519   RDW 15.2 01/02/2017 1007   LYMPHSABS 1.9 01/02/2017 1007   EOSABS 0.1 01/02/2017 1007   BASOSABS 0.0 01/02/2017 1007   Iron/TIBC/Ferritin/ %Sat No results found for: IRON, TIBC, FERRITIN, IRONPCTSAT Lipid Panel     Component Value Date/Time  CHOL 181 01/02/2017 1007   TRIG 125 01/02/2017 1007   HDL 57 01/02/2017 1007   LDLCALC 99 01/02/2017 1007   Hepatic Function Panel     Component Value Date/Time   PROT 7.2 01/02/2017 1007   ALBUMIN 4.1 01/02/2017 1007   AST 12 01/02/2017 1007   ALT 10 01/02/2017 1007   ALKPHOS 92 01/02/2017 1007   BILITOT <0.2 01/02/2017 1007      Component Value Date/Time   TSH 4.280 01/02/2017 1007    ASSESSMENT AND PLAN: Prediabetes  At risk for diabetes mellitus  Class 1 obesity without serious comorbidity with body mass index (BMI) of 32.0 to 32.9 in adult, unspecified obesity type  PLAN:  Pre-Diabetes Whitney Davenport will continue to work on weight loss, exercise, and decreasing simple carbohydrates in her diet to help decrease the risk of diabetes. We dicussed metformin including benefits and risks. She was informed that eating too many simple carbohydrates or too many calories at one sitting increases the likelihood of GI side effects. Whitney Davenport agreed to continue metformin, we will re-check labs in 2 weeks. Whitney Davenport agreed to follow up with [place] as directed to monitor her progress.  Diabetes risk counselling Whitney Davenport was given extended (at least 15 minutes) diabetes prevention counseling today. She is 51 y.o. female and has risk factors for diabetes including obesity. We discussed intensive lifestyle modifications today with an emphasis on weight loss as well as increasing  exercise and decreasing simple carbohydrates in her diet.  Obesity Whitney Davenport is currently in the action stage of change. As such, her goal is to continue with weight loss efforts She has agreed to keep a food journal with 400 to 550 calories and 35 grams of protein at supper daily and follow the Category 2 plan Whitney Davenport has been instructed to work up to a goal of 150 minutes of combined cardio and strengthening exercise per week for weight loss and overall health benefits. We discussed the following Behavioral Modification Stratagies today: increasing lean protein intake, decreasing simple carbohydrates , increasing vegetables, increasing lower sugar fruits and decrease junk food  Whitney Davenport has agreed to follow up with our clinic in 2 weeks. She was informed of the importance of frequent follow up visits to maximize her success with intensive lifestyle modifications for her multiple health conditions.  I, Doreene Nest, am acting as scribe for Dennard Nip, MD  I have reviewed the above documentation for accuracy and completeness, and I agree with the above. -Dennard Nip, MD

## 2017-02-15 ENCOUNTER — Ambulatory Visit (INDEPENDENT_AMBULATORY_CARE_PROVIDER_SITE_OTHER): Payer: 59 | Admitting: Family Medicine

## 2017-02-15 VITALS — BP 112/74 | HR 71 | Temp 98.0°F | Ht 67.0 in | Wt 199.0 lb

## 2017-02-15 DIAGNOSIS — E669 Obesity, unspecified: Secondary | ICD-10-CM | POA: Diagnosis not present

## 2017-02-15 DIAGNOSIS — R7303 Prediabetes: Secondary | ICD-10-CM

## 2017-02-15 DIAGNOSIS — Z6831 Body mass index (BMI) 31.0-31.9, adult: Secondary | ICD-10-CM

## 2017-02-15 MED ORDER — METFORMIN HCL 500 MG PO TABS: 500.0000 mg | ORAL_TABLET | Freq: Every morning | ORAL | 0 refills | Status: DC

## 2017-02-15 MED FILL — metFORMIN HCL 500 MG TABS: 500 | 30 days supply | Qty: 30 | Fill #0

## 2017-02-15 NOTE — Progress Notes (Signed)
Office: [phone]  /  Fax: [phone]   HPI:   Chief Complaint: OBESITY Pluma is here to discuss her progress with her obesity treatment plan. She is on the  keep a food journal with 400 to 550 calories and 35 grams of protein daily and follow the Category 2 plan and is following her eating plan approximately 100 % of the time. She states she is exercising 0 minutes 0 times per week. Arora is doing well with weight loss on category 2 plan with journaling dinner occasionally. She is doing well increasing lean protein, hunger is appropriate. Her weight is 199 lb (90.3 kg) today and has had a weight loss of 6 pounds over a period of 2 weeks since her last visit. She has lost 17 lbs since starting treatment with [place].  Pre-Diabetes Smith has a diagnosis of prediabetes based on her elevated Hgb A1c and was informed this puts her at greater risk of developing diabetes. She is doing well with diet and metformin and she continues to work on diet and exercise to decrease risk of diabetes. She denies nausea, vomiting or hypoglycemia. She is working on Printmaker protein and vegetables and is working on decreasing simple carbohydrates and saturated fats.   Wt Readings from Last 500 Encounters:  02/15/17 199 lb (90.3 kg)  02/02/17 205 lb (93 kg)  01/17/17 208 lb (94.3 kg)  01/02/17 216 lb (98 kg)  02/03/15 207 lb (93.9 kg)  01/27/15 207 lb (93.9 kg)  10/24/14 206 lb (93.4 kg)  11/01/11 194 lb (88 kg)  09/12/11 195 lb (88.5 kg)     ALLERGIES: Allergies  Allergen Reactions  . Sulfa Antibiotics Nausea And Vomiting and Other (See Comments)    Causes headache with nausea  . Hydrocodone Itching and Rash  . Zithromax [Azithromycin] Itching and Rash    MEDICATIONS: Current Outpatient Prescriptions on File Prior to Visit  Medication Sig Dispense Refill  . cetirizine (ZYRTEC) 10 MG tablet Take 10 mg by mouth at bedtime.     . clonazePAM (KLONOPIN) 0.5 MG tablet Take 0.5 mg  by mouth 2 (two) times daily as needed for anxiety.     Marland Kitchen estradiol (VIVELLE-DOT) 0.075 MG/24HR Place 1 patch onto the skin 2 (two) times a week.    . metFORMIN (GLUCOPHAGE) 500 MG tablet Take 1 tablet (500 mg total) by mouth every morning. 30 tablet 0  . progesterone (PROMETRIUM) 100 MG capsule Take 100 mg by mouth at bedtime.    . sertraline (ZOLOFT) 100 MG tablet Take 100 mg by mouth at bedtime.     . simvastatin (ZOCOR) 40 MG tablet Take 40 mg by mouth at bedtime.     . vitamin B-12 (CYANOCOBALAMIN) 1000 MCG tablet Take 1 tablet (1,000 mcg total) by mouth daily. 30 tablet 0  . Vitamin D, Ergocalciferol, (DRISDOL) 50000 units CAPS capsule Take 50,000 Units by mouth every 7 (seven) days.     No current facility-administered medications on file prior to visit.     PAST MEDICAL HISTORY: Past Medical History:  Diagnosis Date  . Allergy   . Anemia    hx of  . Anxiety   . Back injury    "ruptured disc-no problems since surgery"  . Back pain   . Constipation   . Depression   . GERD (gastroesophageal reflux disease)    occ  . Hepatitis 2004   hx of hep c-"interferon treatment, does not show up on blood test anymore"  . History of  kidney stones   . Hyperlipidemia   . Hypotension    no fainting in years  . Infertility, female   . Lumbar disc disease   . Osteoarthritis   . Vitamin D deficiency     PAST SURGICAL HISTORY: Past Surgical History:  Procedure Laterality Date  . DIAGNOSTIC LAPAROSCOPY  last 2001   for endometriosis, couple of surgeries  . GANGLION CYST EXCISION  age 51  . LUMBAR LAMINECTOMY/DECOMPRESSION MICRODISCECTOMY  11/03/2011   Procedure: LUMBAR LAMINECTOMY/DECOMPRESSION MICRODISCECTOMY;  Surgeon: Hosie Spangle, MD;  Location: [facility];  Service: Neurosurgery;  Laterality: Left;  LEFT Lumbar five sacral one laminotomy and microdiskectomy  . TOTAL HIP ARTHROPLASTY Right 02/03/2015   Procedure: RIGHT TOTAL HIP ARTHROPLASTY ANTERIOR APPROACH;  Surgeon:  Paralee Cancel, MD;  Location: [facility];  Service: Orthopedics;  Laterality: Right;    SOCIAL HISTORY: Social History  Substance Use Topics  . Smoking status: Former Smoker    Years: 15.00    Types: Cigarettes    Quit date: 10/31/2010  . Smokeless tobacco: Never Used  . Alcohol use Yes     Comment: rare    FAMILY HISTORY: Family History  Problem Relation Age of Onset  . Thyroid disease Mother   . Diabetes Father   . Hyperlipidemia Father   . Obesity Father   . Cancer Maternal Grandmother     ROS: Review of Systems  Constitutional: Positive for weight loss.  Gastrointestinal: Negative for nausea and vomiting.  Endo/Heme/Allergies:       Negative hypoglycemia    PHYSICAL EXAM: Blood pressure 112/74, pulse 71, temperature 98 F (36.7 C), temperature source Oral, height 5\' 7"  (1.702 m), weight 199 lb (90.3 kg), SpO2 98 %. Body mass index is 31.17 kg/m. Physical Exam  Constitutional: She is oriented to person, place, and time. She appears well-developed and well-nourished.  Cardiovascular: Normal rate.   Pulmonary/Chest: Effort normal.  Musculoskeletal: Normal range of motion.  Neurological: She is oriented to person, place, and time.  Skin: Skin is warm and dry.  Psychiatric: She has a normal mood and affect. Her behavior is normal.  Vitals reviewed.   RECENT LABS AND TESTS: BMET    Component Value Date/Time   NA 142 01/02/2017 1007   K 4.7 01/02/2017 1007   CL 102 01/02/2017 1007   CO2 25 01/02/2017 1007   GLUCOSE 103 (H) 01/02/2017 1007   GLUCOSE 110 (H) 02/04/2015 0519   BUN 13 01/02/2017 1007   CREATININE 0.94 01/02/2017 1007   CALCIUM 9.0 01/02/2017 1007   GFRNONAA 70 01/02/2017 1007   GFRAA 81 01/02/2017 1007   Lab Results  Component Value Date   HGBA1C 5.9 (H) 01/02/2017   Lab Results  Component Value Date   INSULIN 25.0 (H) 01/02/2017   CBC    Component Value Date/Time   WBC 6.8 01/02/2017 1007   WBC 13.1 (H) 02/04/2015 0519   RBC 4.32  01/02/2017 1007   RBC 3.71 (L) 02/04/2015 0519   HGB 10.2 (L) 02/04/2015 0519   HCT 37.1 01/02/2017 1007   PLT 226 02/04/2015 0519   MCV 86 01/02/2017 1007   MCH 26.9 01/02/2017 1007   MCH 27.5 02/04/2015 0519   MCHC 31.3 (L) 01/02/2017 1007   MCHC 30.6 02/04/2015 0519   RDW 15.2 01/02/2017 1007   LYMPHSABS 1.9 01/02/2017 1007   EOSABS 0.1 01/02/2017 1007   BASOSABS 0.0 01/02/2017 1007   Iron/TIBC/Ferritin/ %Sat No results found for: IRON, TIBC, FERRITIN, IRONPCTSAT Lipid Panel  Component Value Date/Time   CHOL 181 01/02/2017 1007   TRIG 125 01/02/2017 1007   HDL 57 01/02/2017 1007   LDLCALC 99 01/02/2017 1007   Hepatic Function Panel     Component Value Date/Time   PROT 7.2 01/02/2017 1007   ALBUMIN 4.1 01/02/2017 1007   AST 12 01/02/2017 1007   ALT 10 01/02/2017 1007   ALKPHOS 92 01/02/2017 1007   BILITOT <0.2 01/02/2017 1007      Component Value Date/Time   TSH 4.280 01/02/2017 1007    ASSESSMENT AND PLAN: Prediabetes - Plan: metFORMIN (GLUCOPHAGE) 500 MG tablet  Class 1 obesity without serious comorbidity with body mass index (BMI) of 31.0 to 31.9 in adult, unspecified obesity type  PLAN:  Pre-Diabetes Leva will continue to work on weight loss, exercise, and decreasing simple carbohydrates in her diet to help decrease the risk of diabetes. We dicussed metformin including benefits and risks. She was informed that eating too many simple carbohydrates or too many calories at one sitting increases the likelihood of GI side effects. Markel agrees to continue to take metformin for now and a prescription was written today for 1 month refill. Aryanne agreed to follow up with [place] as directed to monitor her progress.  We spent > than 50% of the 15 minute visit on the counseling as documented in the note.  Obesity Cattie is currently in the action stage of change. As such, her goal is to continue with weight loss efforts She has agreed to keep a food  journal with 400 to 550 calories and 35 grams of protein at supper daily and follow the Category 2 plan Annalyce has been instructed to work up to a goal of 150 minutes of combined cardio and strengthening exercise per week or start walking 20 to 30 minutes 3 to 5 times per week for weight loss and overall health benefits. We discussed the following Behavioral Modification Stratagies today: increasing lean protein intake, decreasing simple carbohydrates  and dealing with family or coworker sabotage  Janiylah has agreed to follow up with our clinic in 2 weeks. She was informed of the importance of frequent follow up visits to maximize her success with intensive lifestyle modifications for her multiple health conditions.  I, Doreene Nest, am acting as scribe for Dennard Nip, MD  I have reviewed the above documentation for accuracy and completeness, and I agree with the above. -Dennard Nip, MD

## 2017-02-17 MED FILL — MINIVELLE 0.075 MG PATCH: 0.075 | 28 days supply | Qty: 8 | Fill #9

## 2017-03-01 MED FILL — SERTRALINE HCL 100 MG TAB: 100 | 90 days supply | Qty: 90 | Fill #0

## 2017-03-01 MED FILL — PROGESTERONE 100 MG CAPSULE: 100 | 30 days supply | Qty: 30 | Fill #6

## 2017-03-02 ENCOUNTER — Ambulatory Visit (INDEPENDENT_AMBULATORY_CARE_PROVIDER_SITE_OTHER): Payer: 59 | Admitting: Family Medicine

## 2017-03-02 VITALS — BP 92/65 | HR 69 | Temp 97.9°F | Ht 67.0 in | Wt 196.0 lb

## 2017-03-02 DIAGNOSIS — R7303 Prediabetes: Secondary | ICD-10-CM

## 2017-03-02 DIAGNOSIS — E669 Obesity, unspecified: Secondary | ICD-10-CM

## 2017-03-02 DIAGNOSIS — Z9189 Other specified personal risk factors, not elsewhere classified: Secondary | ICD-10-CM

## 2017-03-02 DIAGNOSIS — Z6830 Body mass index (BMI) 30.0-30.9, adult: Secondary | ICD-10-CM

## 2017-03-02 NOTE — Progress Notes (Signed)
Office: [phone]  /  Fax: [phone]   HPI:   Chief Complaint: OBESITY Whitney Davenport is here to discuss her progress with her obesity treatment plan. She is on the  follow the Category 2 plan and is following her eating plan approximately 85 to 90 % of the time. She states she is walking 30 minutes 3 times per week. Whitney Davenport continues to do well with weight loss. She struggled due to change in her schedule. She has started walking for exercise. Whitney Davenport is bored with lunch options. She does journal dinner on and off. Her weight is 196 lb (88.9 kg) today and has had a weight loss of 2 pounds over a period of 2 weeks since her last visit. She has lost 20 lbs since starting treatment with [place].  Pre-Diabetes Whitney Davenport has a diagnosis of pre-diabetes based on her elevated Hgb A1c and was informed this puts her at greater risk of developing diabetes. She is stable on metformin and she is doing well following diet prescription and decreasing simple carbohydrates. She continues to work on diet and exercise to decrease risk of diabetes. Polyphagia is improved and she denies nausea or hypoglycemia.   ALLERGIES: Allergies  Allergen Reactions   Sulfa Antibiotics Nausea And Vomiting and Other (See Comments)    Causes headache with nausea   Hydrocodone Itching and Rash   Zithromax [Azithromycin] Itching and Rash    MEDICATIONS: Current Outpatient Prescriptions on File Prior to Visit  Medication Sig Dispense Refill   cetirizine (ZYRTEC) 10 MG tablet Take 10 mg by mouth at bedtime.      clonazePAM (KLONOPIN) 0.5 MG tablet Take 0.5 mg by mouth 2 (two) times daily as needed for anxiety.      estradiol (VIVELLE-DOT) 0.075 MG/24HR Place 1 patch onto the skin 2 (two) times a week.     metFORMIN (GLUCOPHAGE) 500 MG tablet Take 1 tablet (500 mg total) by mouth every morning. 30 tablet 0   progesterone (PROMETRIUM) 100 MG capsule Take 100 mg by mouth at bedtime.     sertraline (ZOLOFT) 100 MG  tablet Take 100 mg by mouth at bedtime.      simvastatin (ZOCOR) 40 MG tablet Take 40 mg by mouth at bedtime.      vitamin B-12 (CYANOCOBALAMIN) 1000 MCG tablet Take 1 tablet (1,000 mcg total) by mouth daily. 30 tablet 0   Vitamin D, Ergocalciferol, (DRISDOL) 50000 units CAPS capsule Take 50,000 Units by mouth every 7 (seven) days.     No current facility-administered medications on file prior to visit.     PAST MEDICAL HISTORY: Past Medical History:  Diagnosis Date   Allergy    Anemia    hx of   Anxiety    Back injury    "ruptured disc-no problems since surgery"   Back pain    Constipation    Depression    GERD (gastroesophageal reflux disease)    occ   Hepatitis 2004   hx of hep c-"interferon treatment, does not show up on blood test anymore"   History of kidney stones    Hyperlipidemia    Hypotension    no fainting in years   Infertility, female    Lumbar disc disease    Osteoarthritis    Vitamin D deficiency     PAST SURGICAL HISTORY: Past Surgical History:  Procedure Laterality Date   DIAGNOSTIC LAPAROSCOPY  last 2001   for endometriosis, couple of surgeries   GANGLION CYST EXCISION  age 40   LUMBAR  LAMINECTOMY/DECOMPRESSION MICRODISCECTOMY  11/03/2011   Procedure: LUMBAR LAMINECTOMY/DECOMPRESSION MICRODISCECTOMY;  Surgeon: Hosie Spangle, MD;  Location: [facility];  Service: Neurosurgery;  Laterality: Left;  LEFT Lumbar five sacral one laminotomy and microdiskectomy   TOTAL HIP ARTHROPLASTY Right 02/03/2015   Procedure: RIGHT TOTAL HIP ARTHROPLASTY ANTERIOR APPROACH;  Surgeon: Paralee Cancel, MD;  Location: [facility];  Service: Orthopedics;  Laterality: Right;    SOCIAL HISTORY: Social History  Substance Use Topics   Smoking status: Former Smoker    Years: 15.00    Types: Cigarettes    Quit date: 10/31/2010   Smokeless tobacco: Never Used   Alcohol use Yes     Comment: rare    FAMILY HISTORY: Family History  Problem Relation Age  of Onset   Thyroid disease Mother    Diabetes Father    Hyperlipidemia Father    Obesity Father    Cancer Maternal Grandmother     ROS: Review of Systems  Constitutional: Positive for weight loss.  Gastrointestinal: Negative for nausea.  Endo/Heme/Allergies:       Negative hypoglycemia polyphagia    PHYSICAL EXAM: Blood pressure 92/65, pulse 69, temperature 97.9 F (36.6 C), temperature source Oral, height 5\' 7"  (1.702 m), weight 196 lb (88.9 kg), SpO2 97 %. Body mass index is 30.7 kg/m. Physical Exam  Constitutional: She is oriented to person, place, and time. She appears well-developed and well-nourished.  Cardiovascular: Normal rate.   Pulmonary/Chest: Effort normal.  Musculoskeletal: Normal range of motion.  Neurological: She is oriented to person, place, and time.  Skin: Skin is warm and dry.  Psychiatric: She has a normal mood and affect. Her behavior is normal.  Vitals reviewed.   RECENT LABS AND TESTS: BMET    Component Value Date/Time   NA 142 01/02/2017 1007   K 4.7 01/02/2017 1007   CL 102 01/02/2017 1007   CO2 25 01/02/2017 1007   GLUCOSE 103 (H) 01/02/2017 1007   GLUCOSE 110 (H) 02/04/2015 0519   BUN 13 01/02/2017 1007   CREATININE 0.94 01/02/2017 1007   CALCIUM 9.0 01/02/2017 1007   GFRNONAA 70 01/02/2017 1007   GFRAA 81 01/02/2017 1007   Lab Results  Component Value Date   HGBA1C 5.9 (H) 01/02/2017   Lab Results  Component Value Date   INSULIN 25.0 (H) 01/02/2017   CBC    Component Value Date/Time   WBC 6.8 01/02/2017 1007   WBC 13.1 (H) 02/04/2015 0519   RBC 4.32 01/02/2017 1007   RBC 3.71 (L) 02/04/2015 0519   HGB 10.2 (L) 02/04/2015 0519   HCT 37.1 01/02/2017 1007   PLT 226 02/04/2015 0519   MCV 86 01/02/2017 1007   MCH 26.9 01/02/2017 1007   MCH 27.5 02/04/2015 0519   MCHC 31.3 (L) 01/02/2017 1007   MCHC 30.6 02/04/2015 0519   RDW 15.2 01/02/2017 1007   LYMPHSABS 1.9 01/02/2017 1007   EOSABS 0.1 01/02/2017 1007    BASOSABS 0.0 01/02/2017 1007   Iron/TIBC/Ferritin/ %Sat No results found for: IRON, TIBC, FERRITIN, IRONPCTSAT Lipid Panel     Component Value Date/Time   CHOL 181 01/02/2017 1007   TRIG 125 01/02/2017 1007   HDL 57 01/02/2017 1007   LDLCALC 99 01/02/2017 1007   Hepatic Function Panel     Component Value Date/Time   PROT 7.2 01/02/2017 1007   ALBUMIN 4.1 01/02/2017 1007   AST 12 01/02/2017 1007   ALT 10 01/02/2017 1007   ALKPHOS 92 01/02/2017 1007   BILITOT <0.2 01/02/2017  1007      Component Value Date/Time   TSH 4.280 01/02/2017 1007    ASSESSMENT AND PLAN: Prediabetes  Class 1 obesity without serious comorbidity with body mass index (BMI) of 30.0 to 30.9 in adult, unspecified obesity type  PLAN:  Pre-Diabetes Ziasia will continue to work on weight loss, exercise, and decreasing simple carbohydrates in her diet to help decrease the risk of diabetes. We dicussed metformin including benefits and risks. She was informed that eating too many simple carbohydrates or too many calories at one sitting increases the likelihood of GI side effects. Bryar agrees to continue metformin and we will re-check labs in 1 month. Glorianna agreed to follow up with [place] as directed to monitor her progress.  We spent > than 50% of the 15 minute visit on the counseling as documented in the note.  Diabetes risk counselling Taiz was given extended (at least 15 minutes) diabetes prevention counseling today. She is 51 y.o. female and has risk factors for diabetes including obesity. We discussed intensive lifestyle modifications today with an emphasis on weight loss as well as increasing exercise and decreasing simple carbohydrates in her diet.   Obesity Kyran is currently in the action stage of change. As such, her goal is to continue with weight loss efforts She has agreed to keep a food journal with 1100 to 1300 calories and 75+ grams of protein  Artice has been instructed to  work up to a goal of 150 minutes of combined cardio and strengthening exercise per week for weight loss and overall health benefits. We discussed the following Behavioral Modification Strategies today: increasing lean protein intake, decreasing simple carbohydrates  and work on meal planning and easy cooking plans  Eesha has agreed to follow up with our clinic in 2 weeks. She was informed of the importance of frequent follow up visits to maximize her success with intensive lifestyle modifications for her multiple health conditions.  I, Doreene Nest, am acting as scribe for Dennard Nip, MD  I have reviewed the above documentation for accuracy and completeness, and I agree with the above. -Dennard Nip, MD

## 2017-03-15 ENCOUNTER — Ambulatory Visit (INDEPENDENT_AMBULATORY_CARE_PROVIDER_SITE_OTHER): Payer: 59 | Admitting: Family Medicine

## 2017-03-15 VITALS — BP 103/71 | HR 63 | Temp 97.9°F | Ht 67.0 in | Wt 195.0 lb

## 2017-03-15 DIAGNOSIS — R7303 Prediabetes: Secondary | ICD-10-CM

## 2017-03-15 DIAGNOSIS — Z6830 Body mass index (BMI) 30.0-30.9, adult: Secondary | ICD-10-CM

## 2017-03-15 DIAGNOSIS — E559 Vitamin D deficiency, unspecified: Secondary | ICD-10-CM | POA: Diagnosis not present

## 2017-03-15 DIAGNOSIS — E669 Obesity, unspecified: Secondary | ICD-10-CM | POA: Diagnosis not present

## 2017-03-15 MED ORDER — VITAMIN D (ERGOCALCIFEROL) 1.25 MG (50000 UNIT) PO CAPS: 50000.0000 [IU] | ORAL_CAPSULE | ORAL | 3 refills | Status: DC

## 2017-03-15 MED ORDER — METFORMIN HCL 500 MG PO TABS: 500.0000 mg | ORAL_TABLET | Freq: Every morning | ORAL | 0 refills | Status: DC

## 2017-03-15 MED FILL — SIMVASTATIN 40 MG TABLET: 40 | 90 days supply | Qty: 90 | Fill #1

## 2017-03-15 MED FILL — VIT D2 1.25 MG (50,000 UNIT: 1.25 MG | 28 days supply | Qty: 4 | Fill #0

## 2017-03-15 MED FILL — MINIVELLE 0.075 MG PATCH: 0.075 | 28 days supply | Qty: 8 | Fill #10

## 2017-03-15 MED FILL — metFORMIN HCL 500 MG TABS: 500 | 30 days supply | Qty: 30 | Fill #0

## 2017-03-16 NOTE — Progress Notes (Signed)
Office: [phone]  /  Fax: [phone]   HPI:   Chief Complaint: OBESITY Whitney Davenport is here to discuss her progress with her obesity treatment plan. She is on the  follow the Category 2 plan and is following her eating plan approximately 85 % of the time. She states she is walking 30 minutes 3 times per week. Whitney Davenport continues to do well with weight loss. She has been making better food choices. Her weight is 195 lb (88.5 kg) today and has had a weight loss of 1 pound over a period of 2 weeks since her last visit. She has lost 21 lbs since starting treatment with [place].  Vitamin D deficiency Whitney Davenport has a diagnosis of vitamin D deficiency. She is currently taking vit D and denies nausea, vomiting or muscle weakness.  Pre-Diabetes Whitney Davenport has a diagnosis of pre-diabetes based on her elevated Hgb A1c and was informed this puts her at greater risk of developing diabetes. She is taking metformin currently and continues to work on diet and exercise to decrease risk of diabetes. She denies nausea or hypoglycemia.   ALLERGIES: Allergies  Allergen Reactions  . Sulfa Antibiotics Nausea And Vomiting and Other (See Comments)    Causes headache with nausea  . Hydrocodone Itching and Rash  . Zithromax [Azithromycin] Itching and Rash    MEDICATIONS: Current Outpatient Prescriptions on File Prior to Visit  Medication Sig Dispense Refill  . cetirizine (ZYRTEC) 10 MG tablet Take 10 mg by mouth at bedtime.     . clonazePAM (KLONOPIN) 0.5 MG tablet Take 0.5 mg by mouth 2 (two) times daily as needed for anxiety.     Marland Kitchen estradiol (VIVELLE-DOT) 0.075 MG/24HR Place 1 patch onto the skin 2 (two) times a week.    . progesterone (PROMETRIUM) 100 MG capsule Take 100 mg by mouth at bedtime.    . sertraline (ZOLOFT) 100 MG tablet Take 100 mg by mouth at bedtime.     . simvastatin (ZOCOR) 40 MG tablet Take 40 mg by mouth at bedtime.     . vitamin B-12 (CYANOCOBALAMIN) 1000 MCG tablet Take 1 tablet  (1,000 mcg total) by mouth daily. 30 tablet 0   No current facility-administered medications on file prior to visit.     PAST MEDICAL HISTORY: Past Medical History:  Diagnosis Date  . Allergy   . Anemia    hx of  . Anxiety   . Back injury    "ruptured disc-no problems since surgery"  . Back pain   . Constipation   . Depression   . GERD (gastroesophageal reflux disease)    occ  . Hepatitis 2004   hx of hep c-"interferon treatment, does not show up on blood test anymore"  . History of kidney stones   . Hyperlipidemia   . Hypotension    no fainting in years  . Infertility, female   . Lumbar disc disease   . Osteoarthritis   . Vitamin D deficiency     PAST SURGICAL HISTORY: Past Surgical History:  Procedure Laterality Date  . DIAGNOSTIC LAPAROSCOPY  last 2001   for endometriosis, couple of surgeries  . GANGLION CYST EXCISION  age 33  . LUMBAR LAMINECTOMY/DECOMPRESSION MICRODISCECTOMY  11/03/2011   Procedure: LUMBAR LAMINECTOMY/DECOMPRESSION MICRODISCECTOMY;  Surgeon: Hosie Spangle, MD;  Location: [facility] [facility];  Service: Neurosurgery;  Laterality: Left;  LEFT Lumbar five sacral one laminotomy and microdiskectomy  . TOTAL HIP ARTHROPLASTY Right 02/03/2015   Procedure: RIGHT TOTAL HIP ARTHROPLASTY ANTERIOR APPROACH;  Surgeon:  Paralee Cancel, MD;  Location: [facility];  Service: Orthopedics;  Laterality: Right;    SOCIAL HISTORY: Social History  Substance Use Topics  . Smoking status: Former Smoker    Years: 15.00    Types: Cigarettes    Quit date: 10/31/2010  . Smokeless tobacco: Never Used  . Alcohol use Yes     Comment: rare    FAMILY HISTORY: Family History  Problem Relation Age of Onset  . Thyroid disease Mother   . Diabetes Father   . Hyperlipidemia Father   . Obesity Father   . Cancer Maternal Grandmother     ROS: Review of Systems  Constitutional: Positive for weight loss.  Gastrointestinal: Negative for nausea and vomiting.  Musculoskeletal:        Negative muscle weakness  Endo/Heme/Allergies:       Negative hypoglycemia    PHYSICAL EXAM: Blood pressure 103/71, pulse 63, temperature 97.9 F (36.6 C), temperature source Oral, height 5\' 7"  (1.702 m), weight 195 lb (88.5 kg), SpO2 99 %. Body mass index is 30.54 kg/m. Physical Exam  Constitutional: She is oriented to person, place, and time. She appears well-developed and well-nourished.  Cardiovascular: Normal rate.   Pulmonary/Chest: Effort normal.  Musculoskeletal: Normal range of motion.  Neurological: She is oriented to person, place, and time.  Skin: Skin is warm and dry.  Psychiatric: She has a normal mood and affect. Her behavior is normal.  Vitals reviewed.   RECENT LABS AND TESTS: BMET    Component Value Date/Time   NA 142 01/02/2017 1007   K 4.7 01/02/2017 1007   CL 102 01/02/2017 1007   CO2 25 01/02/2017 1007   GLUCOSE 103 (H) 01/02/2017 1007   GLUCOSE 110 (H) 02/04/2015 0519   BUN 13 01/02/2017 1007   CREATININE 0.94 01/02/2017 1007   CALCIUM 9.0 01/02/2017 1007   GFRNONAA 70 01/02/2017 1007   GFRAA 81 01/02/2017 1007   Lab Results  Component Value Date   HGBA1C 5.9 (H) 01/02/2017   Lab Results  Component Value Date   INSULIN 25.0 (H) 01/02/2017   CBC    Component Value Date/Time   WBC 6.8 01/02/2017 1007   WBC 13.1 (H) 02/04/2015 0519   RBC 4.32 01/02/2017 1007   RBC 3.71 (L) 02/04/2015 0519   HGB 10.2 (L) 02/04/2015 0519   HCT 37.1 01/02/2017 1007   PLT 226 02/04/2015 0519   MCV 86 01/02/2017 1007   MCH 26.9 01/02/2017 1007   MCH 27.5 02/04/2015 0519   MCHC 31.3 (L) 01/02/2017 1007   MCHC 30.6 02/04/2015 0519   RDW 15.2 01/02/2017 1007   LYMPHSABS 1.9 01/02/2017 1007   EOSABS 0.1 01/02/2017 1007   BASOSABS 0.0 01/02/2017 1007   Iron/TIBC/Ferritin/ %Sat No results found for: IRON, TIBC, FERRITIN, IRONPCTSAT Lipid Panel     Component Value Date/Time   CHOL 181 01/02/2017 1007   TRIG 125 01/02/2017 1007   HDL 57 01/02/2017 1007    LDLCALC 99 01/02/2017 1007   Hepatic Function Panel     Component Value Date/Time   PROT 7.2 01/02/2017 1007   ALBUMIN 4.1 01/02/2017 1007   AST 12 01/02/2017 1007   ALT 10 01/02/2017 1007   ALKPHOS 92 01/02/2017 1007   BILITOT <0.2 01/02/2017 1007      Component Value Date/Time   TSH 4.280 01/02/2017 1007    ASSESSMENT AND PLAN: Prediabetes - Plan: metFORMIN (GLUCOPHAGE) 500 MG tablet  Vitamin D deficiency - Plan: Vitamin D, Ergocalciferol, (DRISDOL) 50000 units CAPS  capsule  Class 1 obesity without serious comorbidity with body mass index (BMI) of 30.0 to 30.9 in adult, unspecified obesity type  PLAN:  Vitamin D Deficiency Rital was informed that low vitamin D levels contributes to fatigue and are associated with obesity, breast, and colon cancer. She agrees to continue to take prescription Vit D @50 ,000 IU every week, we will refill for 1 month and will follow up for routine testing of vitamin D, at least 2-3 times per year. She was informed of the risk of over-replacement of vitamin D and agrees to not increase her dose unless he discusses this with [place] first. Whitney Davenport agrees to follow up with our clinic in 2 weeks.  Pre-Diabetes Whitney Davenport will continue to work on weight loss, exercise, and decreasing simple carbohydrates in her diet to help decrease the risk of diabetes. We dicussed metformin including benefits and risks. She was informed that eating too many simple carbohydrates or too many calories at one sitting increases the likelihood of GI side effects. Whitney Davenport requested metformin for now and a prescription was written today for 1 month refill. Whitney Davenport agreed to follow up with [place] as directed to monitor her progress.  Obesity Whitney Davenport is currently in the action stage of change. As such, her goal is to continue with weight loss efforts She has agreed to follow the Category 2 plan Whitney Davenport has been instructed to work up to a goal of 150 minutes of combined cardio  and strengthening exercise per week for weight loss and overall health benefits. We discussed the following Behavioral Modification Strategies today: increasing H2O, increasing lean protein intake and decreasing simple carbohydrates   Whitney Davenport has agreed to follow up with our clinic in 2 weeks. She was informed of the importance of frequent follow up visits to maximize her success with intensive lifestyle modifications for her multiple health conditions.  I, Doreene Nest, am acting as scribe for Dennard Nip, MD  I have reviewed the above documentation for accuracy and completeness, and I agree with the above. -Dennard Nip, MD  OBESITY BEHAVIORAL INTERVENTION VISIT  Today's visit was # 6 out of 22.  Starting weight: 216 lbs Starting date: 01/02/17 Today's weight : 195 lbs Today's date: 03/15/2017 Total lbs lost to date: 21 (Patients must lose 7 lbs in the first 6 months to continue with counseling)   ASK: We discussed the diagnosis of obesity with Whitney Davenport today and Whitney Davenport agreed to give [place] permission to discuss obesity behavioral modification therapy today.  ASSESS: Whitney Davenport has the diagnosis of obesity and her BMI today is 30.6 Whitney Davenport is in the action stage of change   ADVISE: Whitney Davenport was educated on the multiple health risks of obesity as well as the benefit of weight loss to improve her health. She was advised of the need for long term treatment and the importance of lifestyle modifications.  AGREE: Multiple dietary modification options and treatment options were discussed and  Whitney Davenport agreed to follow the Category 2 plan We discussed the following Behavioral Modification Strategies today: increasing H2O, increasing lean protein intake and decreasing simple carbohydrates

## 2017-03-29 ENCOUNTER — Ambulatory Visit (INDEPENDENT_AMBULATORY_CARE_PROVIDER_SITE_OTHER): Payer: 59 | Admitting: Family Medicine

## 2017-03-29 VITALS — BP 100/67 | HR 63 | Temp 97.6°F | Ht 67.0 in | Wt 192.0 lb

## 2017-03-29 DIAGNOSIS — E669 Obesity, unspecified: Secondary | ICD-10-CM | POA: Diagnosis not present

## 2017-03-29 DIAGNOSIS — R7303 Prediabetes: Secondary | ICD-10-CM

## 2017-03-29 DIAGNOSIS — Z6830 Body mass index (BMI) 30.0-30.9, adult: Secondary | ICD-10-CM

## 2017-03-29 DIAGNOSIS — E559 Vitamin D deficiency, unspecified: Secondary | ICD-10-CM

## 2017-03-29 MED FILL — PROGESTERONE 100 MG CAPSULE: 100 | 30 days supply | Qty: 30 | Fill #7

## 2017-03-29 NOTE — Progress Notes (Signed)
Office: [phone]  /  Fax: [phone]   HPI:   Chief Complaint: OBESITY Whitney Davenport is here to discuss her progress with her obesity treatment plan. She is on the  follow the Category 2 plan and is following her eating plan approximately 90 % of the time. She states she is walking for 30 minutes 2 times per week. Whitney Davenport  Continues to do well with weight loss. She is on category 2 plan and also journals sometimes. Her weight is 192 lb (87.1 kg) today and has had a weight loss of 3 pounds over a period of 2 weeks since her last visit. She has lost 24 lbs since starting treatment with [place].  Vitamin D deficiency Whitney Davenport has a diagnosis of vitamin D deficiency. She is currently taking vit D and denies nausea, vomiting or muscle weakness.  Pre-Diabetes Whitney Davenport has a diagnosis of pre-diabetes based on her elevated Hgb A1c and was informed this puts her at greater risk of developing diabetes. She is taking metformin currently and continues to work on diet and exercise to decrease risk of diabetes. She denies nausea, polyphagia or hypoglycemia.   ALLERGIES: Allergies  Allergen Reactions  . Sulfa Antibiotics Nausea And Vomiting and Other (See Comments)    Causes headache with nausea  . Hydrocodone Itching and Rash  . Zithromax [Azithromycin] Itching and Rash    MEDICATIONS: Current Outpatient Prescriptions on File Prior to Visit  Medication Sig Dispense Refill  . cetirizine (ZYRTEC) 10 MG tablet Take 10 mg by mouth at bedtime.     . clonazePAM (KLONOPIN) 0.5 MG tablet Take 0.5 mg by mouth 2 (two) times daily as needed for anxiety.     Marland Kitchen estradiol (VIVELLE-DOT) 0.075 MG/24HR Place 1 patch onto the skin 2 (two) times a week.    . metFORMIN (GLUCOPHAGE) 500 MG tablet Take 1 tablet (500 mg total) by mouth every morning. 30 tablet 0  . progesterone (PROMETRIUM) 100 MG capsule Take 100 mg by mouth at bedtime.    . sertraline (ZOLOFT) 100 MG tablet Take 100 mg by mouth at bedtime.       . simvastatin (ZOCOR) 40 MG tablet Take 40 mg by mouth at bedtime.     . vitamin B-12 (CYANOCOBALAMIN) 1000 MCG tablet Take 1 tablet (1,000 mcg total) by mouth daily. 30 tablet 0  . Vitamin D, Ergocalciferol, (DRISDOL) 50000 units CAPS capsule Take 1 capsule (50,000 Units total) by mouth every 7 (seven) days. 4 capsule 3   No current facility-administered medications on file prior to visit.     PAST MEDICAL HISTORY: Past Medical History:  Diagnosis Date  . Allergy   . Anemia    hx of  . Anxiety   . Back injury    "ruptured disc-no problems since surgery"  . Back pain   . Constipation   . Depression   . GERD (gastroesophageal reflux disease)    occ  . Hepatitis 2004   hx of hep c-"interferon treatment, does not show up on blood test anymore"  . History of kidney stones   . Hyperlipidemia   . Hypotension    no fainting in years  . Infertility, female   . Lumbar disc disease   . Osteoarthritis   . Vitamin D deficiency     PAST SURGICAL HISTORY: Past Surgical History:  Procedure Laterality Date  . DIAGNOSTIC LAPAROSCOPY  last 2001   for endometriosis, couple of surgeries  . GANGLION CYST EXCISION  age 15  . LUMBAR LAMINECTOMY/DECOMPRESSION MICRODISCECTOMY  11/03/2011   Procedure: LUMBAR LAMINECTOMY/DECOMPRESSION MICRODISCECTOMY;  Surgeon: Hosie Spangle, MD;  Location: [place];  Service: Neurosurgery;  Laterality: Left;  LEFT Lumbar five sacral one laminotomy and microdiskectomy  . TOTAL HIP ARTHROPLASTY Right 02/03/2015   Procedure: RIGHT TOTAL HIP ARTHROPLASTY ANTERIOR APPROACH;  Surgeon: Paralee Cancel, MD;  Location: [facility];  Service: Orthopedics;  Laterality: Right;    SOCIAL HISTORY: Social History  Substance Use Topics  . Smoking status: Former Smoker    Years: 15.00    Types: Cigarettes    Quit date: 10/31/2010  . Smokeless tobacco: Never Used  . Alcohol use Yes     Comment: rare    FAMILY HISTORY: Family History  Problem Relation Age of Onset  .  Thyroid disease Mother   . Diabetes Father   . Hyperlipidemia Father   . Obesity Father   . Cancer Maternal Grandmother     ROS: Review of Systems  Constitutional: Positive for weight loss.  Gastrointestinal: Negative for nausea and vomiting.  Musculoskeletal:       Negative muscle weakness  Endo/Heme/Allergies:       Negative polyphagia Negative hypoglycemia    PHYSICAL EXAM: Blood pressure 100/67, pulse 63, temperature 97.6 F (36.4 C), temperature source Oral, height 5\' 7"  (1.702 m), weight 192 lb (87.1 kg), SpO2 100 %. Body mass index is 30.07 kg/m. Physical Exam  Constitutional: She is oriented to person, place, and time. She appears well-developed and well-nourished.  Cardiovascular: Normal rate.   Pulmonary/Chest: Effort normal.  Musculoskeletal: Normal range of motion.  Neurological: She is oriented to person, place, and time.  Skin: Skin is warm and dry.  Psychiatric: She has a normal mood and affect. Her behavior is normal.  Vitals reviewed.   RECENT LABS AND TESTS: BMET    Component Value Date/Time   NA 142 01/02/2017 1007   K 4.7 01/02/2017 1007   CL 102 01/02/2017 1007   CO2 25 01/02/2017 1007   GLUCOSE 103 (H) 01/02/2017 1007   GLUCOSE 110 (H) 02/04/2015 0519   BUN 13 01/02/2017 1007   CREATININE 0.94 01/02/2017 1007   CALCIUM 9.0 01/02/2017 1007   GFRNONAA 70 01/02/2017 1007   GFRAA 81 01/02/2017 1007   Lab Results  Component Value Date   HGBA1C 5.9 (H) 01/02/2017   Lab Results  Component Value Date   INSULIN 25.0 (H) 01/02/2017   CBC    Component Value Date/Time   WBC 6.8 01/02/2017 1007   WBC 13.1 (H) 02/04/2015 0519   RBC 4.32 01/02/2017 1007   RBC 3.71 (L) 02/04/2015 0519   HGB 11.6 01/02/2017 1007   HCT 37.1 01/02/2017 1007   PLT 226 02/04/2015 0519   MCV 86 01/02/2017 1007   MCH 26.9 01/02/2017 1007   MCH 27.5 02/04/2015 0519   MCHC 31.3 (L) 01/02/2017 1007   MCHC 30.6 02/04/2015 0519   RDW 15.2 01/02/2017 1007   LYMPHSABS  1.9 01/02/2017 1007   EOSABS 0.1 01/02/2017 1007   BASOSABS 0.0 01/02/2017 1007   Iron/TIBC/Ferritin/ %Sat No results found for: IRON, TIBC, FERRITIN, IRONPCTSAT Lipid Panel     Component Value Date/Time   CHOL 181 01/02/2017 1007   TRIG 125 01/02/2017 1007   HDL 57 01/02/2017 1007   LDLCALC 99 01/02/2017 1007   Hepatic Function Panel     Component Value Date/Time   PROT 7.2 01/02/2017 1007   ALBUMIN 4.1 01/02/2017 1007   AST 12 01/02/2017 1007   ALT 10 01/02/2017 1007  ALKPHOS 92 01/02/2017 1007   BILITOT <0.2 01/02/2017 1007      Component Value Date/Time   TSH 4.280 01/02/2017 1007    ASSESSMENT AND PLAN: Prediabetes  Vitamin D deficiency  Class 1 obesity without serious comorbidity with body mass index (BMI) of 30.0 to 30.9 in adult, unspecified obesity type  PLAN:  Vitamin D Deficiency Whitney Davenport was informed that low vitamin D levels contributes to fatigue and are associated with obesity, breast, and colon cancer. She agrees to continue to take prescription Vit D @50 ,000 IU every week and will follow up for routine testing of vitamin D, at least 2-3 times per year. She was informed of the risk of over-replacement of vitamin D and agrees to not increase her dose unless he discusses this with [place] first.  Pre-Diabetes Whitney Davenport will continue to work on weight loss, exercise, and decreasing simple carbohydrates in her diet to help decrease the risk of diabetes. We dicussed metformin including benefits and risks. She was informed that eating too many simple carbohydrates or too many calories at one sitting increases the likelihood of GI side effects. Whitney Davenport agrees to continue metformin and follow up with [place] as directed to monitor her progress.  We spent > than 50% of the 15 minute visit on the counseling as documented in the note.  Obesity Whitney Davenport is currently in the action stage of change. As such, her goal is to continue with weight loss efforts She has agreed  to follow the Category 2 plan Whitney Davenport has been instructed to work up to a goal of 150 minutes of combined cardio and strengthening exercise per week for weight loss and overall health benefits. We discussed the following Behavioral Modification Strategies today: increasing lean protein intake and meal planning & cooking strategies  Whitney Davenport has agreed to follow up with our clinic in 2 to 3 weeks. She was informed of the importance of frequent follow up visits to maximize her success with intensive lifestyle modifications for her multiple health conditions.  I, Doreene Nest, am acting as transcriptionist for Dennard Nip, MD  I have reviewed the above documentation for accuracy and completeness, and I agree with the above. -Dennard Nip, MD  OBESITY BEHAVIORAL INTERVENTION VISIT  Today's visit was # 7 out of 22.  Starting weight: 216 lbs Starting date: 01/02/17 Today's weight : 192 lbs Today's date: 03/29/2017 Total lbs lost to date: 24 (Patients must lose 7 lbs in the first 6 months to continue with counseling)   ASK: We discussed the diagnosis of obesity with Whitney Davenport today and Whitney Davenport agreed to give [place] permission to discuss obesity behavioral modification therapy today.  ASSESS: Whitney Davenport has the diagnosis of obesity and her BMI today is 30.1 Whitney Davenport is in the action stage of change   ADVISE: Whitney Davenport was educated on the multiple health risks of obesity as well as the benefit of weight loss to improve her health. She was advised of the need for long term treatment and the importance of lifestyle modifications.  AGREE: Multiple dietary modification options and treatment options were discussed and  Whitney Davenport agreed to follow the Category 2 plan We discussed the following Behavioral Modification Strategies today: increasing lean protein intake and meal planning & cooking strategies

## 2017-04-12 MED FILL — MINIVELLE 0.075 MG PATCH: 0.075 | 28 days supply | Qty: 8 | Fill #11

## 2017-04-17 ENCOUNTER — Ambulatory Visit (INDEPENDENT_AMBULATORY_CARE_PROVIDER_SITE_OTHER): Payer: 59 | Admitting: Family Medicine

## 2017-04-17 VITALS — BP 95/66 | HR 71 | Temp 98.0°F | Ht 67.0 in | Wt 188.0 lb

## 2017-04-17 DIAGNOSIS — E669 Obesity, unspecified: Secondary | ICD-10-CM | POA: Diagnosis not present

## 2017-04-17 DIAGNOSIS — Z9189 Other specified personal risk factors, not elsewhere classified: Secondary | ICD-10-CM

## 2017-04-17 DIAGNOSIS — Z6830 Body mass index (BMI) 30.0-30.9, adult: Secondary | ICD-10-CM | POA: Diagnosis not present

## 2017-04-17 DIAGNOSIS — R7303 Prediabetes: Secondary | ICD-10-CM

## 2017-04-17 DIAGNOSIS — E559 Vitamin D deficiency, unspecified: Secondary | ICD-10-CM | POA: Diagnosis not present

## 2017-04-17 MED ORDER — METFORMIN HCL 500 MG PO TABS: 500.0000 mg | ORAL_TABLET | Freq: Every morning | ORAL | 0 refills | Status: DC

## 2017-04-17 MED ORDER — VITAMIN D (ERGOCALCIFEROL) 1.25 MG (50000 UNIT) PO CAPS: 50000.0000 [IU] | ORAL_CAPSULE | ORAL | 3 refills | Status: DC

## 2017-04-17 MED FILL — VIT D2 1.25 MG (50,000 UNIT: 1.25 MG | 28 days supply | Qty: 4 | Fill #0

## 2017-04-17 MED FILL — metFORMIN HCL 500 MG TABS: 500 | 30 days supply | Qty: 30 | Fill #0

## 2017-04-17 NOTE — Progress Notes (Signed)
Office: [phone]  /  Fax: [phone]   HPI:   Chief Complaint: OBESITY Whitney Davenport is here to discuss her progress with her obesity treatment plan. She is on the  follow the Category 2 plan and is following her eating plan approximately 85 % of the time. She states she is walking 20 minutes 1 time per week. Whitney Davenport continues to do very well with weight loss on category 2 plan but is getting bored with dinner options. She is getting ready to go on vacation. Her weight is 188 lb (85.3 kg) today and has had a weight loss of 4 pounds over a period of 2 to 3 weeks since her last visit. She has lost 28 lbs since starting treatment with [place].  Vitamin D deficiency Whitney Davenport has a diagnosis of vitamin D deficiency. She is currently stable on prescription vit D. Fatigue is improving and she denies nausea, vomiting or muscle weakness.  Pre-Diabetes Whitney Davenport has a diagnosis of pre-diabetes based on her elevated Hgb A1c and was informed this puts her at greater risk of developing diabetes. She is stable on metformin currently. She is doing well with diet and exercise and Whitney Davenport continues to work on diet and exercise to decrease risk of diabetes. She denies nausea or hypoglycemia.  At risk for diabetes Whitney Davenport is at higher than average isk for developing diabetes due to her obesity and pre-diabetes. She currently denies polyuria or polydipsia.  ALLERGIES: Allergies  Allergen Reactions   Sulfa Antibiotics Nausea And Vomiting and Other (See Comments)    Causes headache with nausea   Hydrocodone Itching and Rash   Zithromax [Azithromycin] Itching and Rash    MEDICATIONS: Current Outpatient Prescriptions on File Prior to Visit  Medication Sig Dispense Refill   cetirizine (ZYRTEC) 10 MG tablet Take 10 mg by mouth at bedtime.      clonazePAM (KLONOPIN) 0.5 MG tablet Take 0.5 mg by mouth 2 (two) times daily as needed for anxiety.      estradiol (VIVELLE-DOT) 0.075 MG/24HR Place 1  patch onto the skin 2 (two) times a week.     progesterone (PROMETRIUM) 100 MG capsule Take 100 mg by mouth at bedtime.     sertraline (ZOLOFT) 100 MG tablet Take 100 mg by mouth at bedtime.      simvastatin (ZOCOR) 40 MG tablet Take 40 mg by mouth at bedtime.      vitamin B-12 (CYANOCOBALAMIN) 1000 MCG tablet Take 1 tablet (1,000 mcg total) by mouth daily. 30 tablet 0   No current facility-administered medications on file prior to visit.     PAST MEDICAL HISTORY: Past Medical History:  Diagnosis Date   Allergy    Anemia    hx of   Anxiety    Back injury    "ruptured disc-no problems since surgery"   Back pain    Constipation    Depression    GERD (gastroesophageal reflux disease)    occ   Hepatitis 2004   hx of hep c-"interferon treatment, does not show up on blood test anymore"   History of kidney stones    Hyperlipidemia    Hypotension    no fainting in years   Infertility, female    Lumbar disc disease    Osteoarthritis    Vitamin D deficiency     PAST SURGICAL HISTORY: Past Surgical History:  Procedure Laterality Date   DIAGNOSTIC LAPAROSCOPY  last 2001   for endometriosis, couple of surgeries   GANGLION CYST EXCISION  age 51  LUMBAR LAMINECTOMY/DECOMPRESSION MICRODISCECTOMY  11/03/2011   Procedure: LUMBAR LAMINECTOMY/DECOMPRESSION MICRODISCECTOMY;  Surgeon: Hosie Spangle, MD;  Location: [facility];  Service: Neurosurgery;  Laterality: Left;  LEFT Lumbar five sacral one laminotomy and microdiskectomy   TOTAL HIP ARTHROPLASTY Right 02/03/2015   Procedure: RIGHT TOTAL HIP ARTHROPLASTY ANTERIOR APPROACH;  Surgeon: Paralee Cancel, MD;  Location: [facility];  Service: Orthopedics;  Laterality: Right;    SOCIAL HISTORY: Social History  Substance Use Topics   Smoking status: Former Smoker    Years: 15.00    Types: Cigarettes    Quit date: 10/31/2010   Smokeless tobacco: Never Used   Alcohol use Yes     Comment: rare    FAMILY  HISTORY: Family History  Problem Relation Age of Onset   Thyroid disease Mother    Diabetes Father    Hyperlipidemia Father    Obesity Father    Cancer Maternal Grandmother     ROS: Review of Systems  Constitutional: Positive for malaise/fatigue and weight loss.  Gastrointestinal: Negative for nausea and vomiting.  Genitourinary: Negative for frequency.  Musculoskeletal:       Negative muscle weakness  Endo/Heme/Allergies: Negative for polydipsia.       Negative hypoglycemia    PHYSICAL EXAM: Blood pressure 95/66, pulse 71, temperature 98 F (36.7 C), temperature source Oral, height 5\' 7"  (1.702 m), weight 188 lb (85.3 kg), SpO2 96 %. Body mass index is 29.44 kg/m. Physical Exam  Constitutional: She is oriented to person, place, and time. She appears well-developed and well-nourished.  Cardiovascular: Normal rate.   Pulmonary/Chest: Effort normal.  Musculoskeletal: Normal range of motion.  Neurological: She is oriented to person, place, and time.  Skin: Skin is warm and dry.  Psychiatric: She has a normal mood and affect. Her behavior is normal.  Vitals reviewed.   RECENT LABS AND TESTS: BMET    Component Value Date/Time   NA 142 01/02/2017 1007   K 4.7 01/02/2017 1007   CL 102 01/02/2017 1007   CO2 25 01/02/2017 1007   GLUCOSE 103 (H) 01/02/2017 1007   GLUCOSE 110 (H) 02/04/2015 0519   BUN 13 01/02/2017 1007   CREATININE 0.94 01/02/2017 1007   CALCIUM 9.0 01/02/2017 1007   GFRNONAA 70 01/02/2017 1007   GFRAA 81 01/02/2017 1007   Lab Results  Component Value Date   HGBA1C 5.9 (H) 01/02/2017   Lab Results  Component Value Date   INSULIN 25.0 (H) 01/02/2017   CBC    Component Value Date/Time   WBC 6.8 01/02/2017 1007   WBC 13.1 (H) 02/04/2015 0519   RBC 4.32 01/02/2017 1007   RBC 3.71 (L) 02/04/2015 0519   HGB 11.6 01/02/2017 1007   HCT 37.1 01/02/2017 1007   PLT 226 02/04/2015 0519   MCV 86 01/02/2017 1007   MCH 26.9 01/02/2017 1007   MCH  27.5 02/04/2015 0519   MCHC 31.3 (L) 01/02/2017 1007   MCHC 30.6 02/04/2015 0519   RDW 15.2 01/02/2017 1007   LYMPHSABS 1.9 01/02/2017 1007   EOSABS 0.1 01/02/2017 1007   BASOSABS 0.0 01/02/2017 1007   Iron/TIBC/Ferritin/ %Sat No results found for: IRON, TIBC, FERRITIN, IRONPCTSAT Lipid Panel     Component Value Date/Time   CHOL 181 01/02/2017 1007   TRIG 125 01/02/2017 1007   HDL 57 01/02/2017 1007   LDLCALC 99 01/02/2017 1007   Hepatic Function Panel     Component Value Date/Time   PROT 7.2 01/02/2017 1007   ALBUMIN 4.1 01/02/2017 1007  AST 12 01/02/2017 1007   ALT 10 01/02/2017 1007   ALKPHOS 92 01/02/2017 1007   BILITOT <0.2 01/02/2017 1007      Component Value Date/Time   TSH 4.280 01/02/2017 1007    ASSESSMENT AND PLAN: Prediabetes - Plan: metFORMIN (GLUCOPHAGE) 500 MG tablet  Vitamin D deficiency - Plan: Vitamin D, Ergocalciferol, (DRISDOL) 50000 units CAPS capsule  At risk for diabetes mellitus  Class 1 obesity with serious comorbidity and body mass index (BMI) of 30.0 to 30.9 in adult, unspecified obesity type - Starting BMI greater then 30  PLAN:  Vitamin D Deficiency Whitney Davenport was informed that low vitamin D levels contributes to fatigue and are associated with obesity, breast, and colon cancer. She agrees to continue to take prescription Vit D @50 ,000 IU every week, we will refill for 1 month and will follow up for routine testing of vitamin D, at least 2-3 times per year. She was informed of the risk of over-replacement of vitamin D and agrees to not increase her dose unless he discusses this with [place] first. Whitney Davenport agrees to follow up with our clinic in 3 weeks.  Pre-Diabetes Whitney Davenport will continue to work on weight loss, exercise, and decreasing simple carbohydrates in her diet to help decrease the risk of diabetes. We dicussed metformin including benefits and risks. She was informed that eating too many simple carbohydrates or too many calories at  one sitting increases the likelihood of GI side effects. Whitney Davenport requested metformin for now and a prescription was written today for 1 month refill. Whitney Davenport agreed to follow up with [place] as directed to monitor her progress.  Diabetes risk counselling Whitney Davenport was given extended (at least 15 minutes) diabetes prevention counseling today. She is 51 y.o. female and has risk factors for diabetes including obesity and pre-diabetes. We discussed intensive lifestyle modifications today with an emphasis on weight loss as well as increasing exercise and decreasing simple carbohydrates in her diet.  Obesity Whitney Davenport is currently in the action stage of change. As such, her goal is to continue with weight loss efforts She has agreed to keep a food journal with 400 to 650 calories and 40+ grams of protein at supper and follow the Category 2 plan Whitney Davenport has been instructed to work up to a goal of 150 minutes of combined cardio and strengthening exercise per week for weight loss and overall health benefits. We discussed the following Behavioral Modification Strategies today: increasing lean protein intake, family/co-worker sabotage and travel eating strategies  Whitney Davenport has agreed to follow up with our clinic in 3 weeks. She was informed of the importance of frequent follow up visits to maximize her success with intensive lifestyle modifications for her multiple health conditions.  I, Doreene Nest, am acting as transcriptionist for Dennard Nip, MD  I have reviewed the above documentation for accuracy and completeness, and I agree with the above. -Dennard Nip, MD  OBESITY BEHAVIORAL INTERVENTION VISIT  Today's visit was # 8 out of 22.  Starting weight: 216 lbs Starting date: 01/02/17 Today's weight : 188 lbs Today's date: 04/17/2017 Total lbs lost to date: 69 (Patients must lose 7 lbs in the first 6 months to continue with counseling)   ASK: We discussed the diagnosis of obesity with  Whitney Davenport today and Camiya agreed to give [place] permission to discuss obesity behavioral modification therapy today.  ASSESS: Maghan has the diagnosis of obesity and her BMI today is 29.5 Allegra is in the action stage of change   ADVISE:  Diego was educated on the multiple health risks of obesity as well as the benefit of weight loss to improve her health. She was advised of the need for long term treatment and the importance of lifestyle modifications.  AGREE: Multiple dietary modification options and treatment options were discussed and  Asuncion agreed to keep a food journal with 400 to 650 calories and 40+ grams of protein  and follow the Category 2 plan We discussed the following Behavioral Modification Strategies today: increasing lean protein intake, family/co-worker sabotage and travel eating strategies.

## 2017-04-26 MED FILL — PROGESTERONE 100 MG CAPSULE: 100 | 30 days supply | Qty: 30 | Fill #8

## 2017-05-08 ENCOUNTER — Ambulatory Visit (INDEPENDENT_AMBULATORY_CARE_PROVIDER_SITE_OTHER): Payer: 59 | Admitting: Family Medicine

## 2017-05-08 VITALS — BP 101/67 | HR 65 | Temp 98.0°F | Ht 67.0 in | Wt 189.0 lb

## 2017-05-08 DIAGNOSIS — E785 Hyperlipidemia, unspecified: Secondary | ICD-10-CM

## 2017-05-08 DIAGNOSIS — E559 Vitamin D deficiency, unspecified: Secondary | ICD-10-CM | POA: Diagnosis not present

## 2017-05-08 DIAGNOSIS — Z9189 Other specified personal risk factors, not elsewhere classified: Secondary | ICD-10-CM

## 2017-05-08 DIAGNOSIS — E669 Obesity, unspecified: Secondary | ICD-10-CM

## 2017-05-08 DIAGNOSIS — R7303 Prediabetes: Secondary | ICD-10-CM | POA: Diagnosis not present

## 2017-05-08 DIAGNOSIS — Z6830 Body mass index (BMI) 30.0-30.9, adult: Secondary | ICD-10-CM | POA: Diagnosis not present

## 2017-05-08 DIAGNOSIS — E538 Deficiency of other specified B group vitamins: Secondary | ICD-10-CM

## 2017-05-08 NOTE — Progress Notes (Signed)
Office: [phone]  /  Fax: [phone]   HPI:   Chief Complaint: OBESITY Whitney Davenport is here to discuss her progress with her obesity treatment plan. She is on the  follow the Category 2 plan and is following her eating plan approximately 50 % of the time. She states she is walking for 30 minutes 2 times per week. Whitney Davenport was on vacation and had increased celebration eating. She increased her walking and worked on increasing lean protein. She did well, mostly maintaining her weight. Her weight is 189 lb (85.7 kg) today and has gained 1 pound over a period of 3 weeks since her last visit. She has lost 27 lbs since starting treatment with [place].  Vitamin D deficiency Whitney Davenport has a diagnosis of vitamin D deficiency. She is currently taking vit D, last lab was not at goal. Fatigue is improving and Whitney Davenport denies nausea, vomiting or muscle weakness. Whitney Davenport is due for labs.  Pre-Diabetes Whitney Davenport has a diagnosis of pre-diabetes based on her elevated Hgb A1c and was informed this puts her at greater risk of developing diabetes. She is stable on metformin currently and continues to work on diet and exercise to decrease risk of diabetes. Whitney Davenport has decreased polyphagia and she denies nausea or hypoglycemia. She has been doing well with diet, exercise and weight loss. She is due for labs.  At risk for diabetes Whitney Davenport is at higher than average risk for developing diabetes due to her obesity and pre-diabetes. She currently denies polyuria or polydipsia.  B12 deficiency Whitney Davenport has a diagnosis of B12 insufficiency and notes fatigue. This is not a new diagnosis. Whitney Davenport is not a vegetarian and does have a previous diagnosis of anemia. She does not have a history of weight loss surgery. She is on OTC B12 and has been working on increasing B12 rich foods. Whitney Davenport is due for labs.  Hyperlipidemia Whitney Davenport has hyperlipidemia and has been trying to improve her cholesterol levels with  intensive lifestyle modification including a low saturated fat diet, exercise and weight loss, with the goal to decrease medications. She is on a statin and she denies any chest pain, claudication or myalgias.   ALLERGIES: Allergies  Allergen Reactions  . Sulfa Antibiotics Nausea And Vomiting and Other (See Comments)    Causes headache with nausea  . Hydrocodone Itching and Rash  . Zithromax [Azithromycin] Itching and Rash    MEDICATIONS: Current Outpatient Prescriptions on File Prior to Visit  Medication Sig Dispense Refill  . cetirizine (ZYRTEC) 10 MG tablet Take 10 mg by mouth at bedtime.     . clonazePAM (KLONOPIN) 0.5 MG tablet Take 0.5 mg by mouth 2 (two) times daily as needed for anxiety.     Marland Kitchen estradiol (VIVELLE-DOT) 0.075 MG/24HR Place 1 patch onto the skin 2 (two) times a week.    . metFORMIN (GLUCOPHAGE) 500 MG tablet Take 1 tablet (500 mg total) by mouth every morning. 30 tablet 0  . progesterone (PROMETRIUM) 100 MG capsule Take 100 mg by mouth at bedtime.    . sertraline (ZOLOFT) 100 MG tablet Take 100 mg by mouth at bedtime.     . simvastatin (ZOCOR) 40 MG tablet Take 40 mg by mouth at bedtime.     . vitamin B-12 (CYANOCOBALAMIN) 1000 MCG tablet Take 1 tablet (1,000 mcg total) by mouth daily. 30 tablet 0  . Vitamin D, Ergocalciferol, (DRISDOL) 50000 units CAPS capsule Take 1 capsule (50,000 Units total) by mouth every 7 (seven) days. 4 capsule 3  No current facility-administered medications on file prior to visit.     PAST MEDICAL HISTORY: Past Medical History:  Diagnosis Date  . Allergy   . Anemia    hx of  . Anxiety   . Back injury    "ruptured disc-no problems since surgery"  . Back pain   . Constipation   . Depression   . GERD (gastroesophageal reflux disease)    occ  . Hepatitis 2004   hx of hep c-"interferon treatment, does not show up on blood test anymore"  . History of kidney stones   . Hyperlipidemia   . Hypotension    no fainting in years  .  Infertility, female   . Lumbar disc disease   . Osteoarthritis   . Vitamin D deficiency     PAST SURGICAL HISTORY: Past Surgical History:  Procedure Laterality Date  . DIAGNOSTIC LAPAROSCOPY  last 2001   for endometriosis, couple of surgeries  . GANGLION CYST EXCISION  age 8  . LUMBAR LAMINECTOMY/DECOMPRESSION MICRODISCECTOMY  11/03/2011   Procedure: LUMBAR LAMINECTOMY/DECOMPRESSION MICRODISCECTOMY;  Surgeon: Hosie Spangle, MD;  Location: [facility];  Service: Neurosurgery;  Laterality: Left;  LEFT Lumbar five sacral one laminotomy and microdiskectomy  . TOTAL HIP ARTHROPLASTY Right 02/03/2015   Procedure: RIGHT TOTAL HIP ARTHROPLASTY ANTERIOR APPROACH;  Surgeon: Paralee Cancel, MD;  Location: [facility];  Service: Orthopedics;  Laterality: Right;    SOCIAL HISTORY: Social History  Substance Use Topics  . Smoking status: Former Smoker    Years: 15.00    Types: Cigarettes    Quit date: 10/31/2010  . Smokeless tobacco: Never Used  . Alcohol use Yes     Comment: rare    FAMILY HISTORY: Family History  Problem Relation Age of Onset  . Thyroid disease Mother   . Diabetes Father   . Hyperlipidemia Father   . Obesity Father   . Cancer Maternal Grandmother     ROS: Review of Systems  Constitutional: Positive for malaise/fatigue. Negative for weight loss.  Cardiovascular: Negative for chest pain and claudication.  Gastrointestinal: Negative for nausea and vomiting.  Genitourinary: Negative for frequency.  Musculoskeletal: Negative for myalgias.       Negative muscle weakness  Endo/Heme/Allergies: Negative for polydipsia.       Polyphagia Negative hypoglycemia     PHYSICAL EXAM: Blood pressure 101/67, pulse 65, temperature 98 F (36.7 C), temperature source Oral, height 5\' 7"  (1.702 m), weight 189 lb (85.7 kg), SpO2 98 %. Body mass index is 29.6 kg/m. Physical Exam  Constitutional: She is oriented to person, place, and time. She appears well-developed and  well-nourished.  Cardiovascular: Normal rate.   Pulmonary/Chest: Effort normal.  Musculoskeletal: Normal range of motion.  Neurological: She is oriented to person, place, and time.  Skin: Skin is warm and dry.  Psychiatric: She has a normal mood and affect. Her behavior is normal.  Vitals reviewed.   RECENT LABS AND TESTS: BMET    Component Value Date/Time   NA 142 01/02/2017 1007   K 4.7 01/02/2017 1007   CL 102 01/02/2017 1007   CO2 25 01/02/2017 1007   GLUCOSE 103 (H) 01/02/2017 1007   GLUCOSE 110 (H) 02/04/2015 0519   BUN 13 01/02/2017 1007   CREATININE 0.94 01/02/2017 1007   CALCIUM 9.0 01/02/2017 1007   GFRNONAA 70 01/02/2017 1007   GFRAA 81 01/02/2017 1007   Lab Results  Component Value Date   HGBA1C 5.9 (H) 01/02/2017   Lab Results  Component Value  Date   INSULIN 25.0 (H) 01/02/2017   CBC    Component Value Date/Time   WBC 6.8 01/02/2017 1007   WBC 13.1 (H) 02/04/2015 0519   RBC 4.32 01/02/2017 1007   RBC 3.71 (L) 02/04/2015 0519   HGB 11.6 01/02/2017 1007   HCT 37.1 01/02/2017 1007   PLT 226 02/04/2015 0519   MCV 86 01/02/2017 1007   MCH 26.9 01/02/2017 1007   MCH 27.5 02/04/2015 0519   MCHC 31.3 (L) 01/02/2017 1007   MCHC 30.6 02/04/2015 0519   RDW 15.2 01/02/2017 1007   LYMPHSABS 1.9 01/02/2017 1007   EOSABS 0.1 01/02/2017 1007   BASOSABS 0.0 01/02/2017 1007   Iron/TIBC/Ferritin/ %Sat No results found for: IRON, TIBC, FERRITIN, IRONPCTSAT Lipid Panel     Component Value Date/Time   CHOL 181 01/02/2017 1007   TRIG 125 01/02/2017 1007   HDL 57 01/02/2017 1007   LDLCALC 99 01/02/2017 1007   Hepatic Function Panel     Component Value Date/Time   PROT 7.2 01/02/2017 1007   ALBUMIN 4.1 01/02/2017 1007   AST 12 01/02/2017 1007   ALT 10 01/02/2017 1007   ALKPHOS 92 01/02/2017 1007   BILITOT <0.2 01/02/2017 1007      Component Value Date/Time   TSH 4.280 01/02/2017 1007    ASSESSMENT AND PLAN: Prediabetes - Plan: Comprehensive  metabolic panel, Hemoglobin A1c, Insulin, random  Vitamin D deficiency - Plan: VITAMIN D 25 Hydroxy (Vit-D Deficiency, Fractures)  B12 nutritional deficiency  Hyperlipidemia, unspecified hyperlipidemia type - Plan: Lipid Panel With LDL/HDL Ratio, Vitamin B12  At risk for diabetes mellitus  Class 1 obesity without serious comorbidity with body mass index (BMI) of 30.0 to 30.9 in adult, unspecified obesity type - patient BMI was >30 when she started the program  PLAN:  Vitamin D Deficiency Whitney Davenport was informed that low vitamin D levels contributes to fatigue and are associated with obesity, breast, and colon cancer. She agrees to continue to take prescription Vit D @50 ,000 IU every week and we will check labs and will follow up for routine testing of vitamin D, at least 2-3 times per year. She was informed of the risk of over-replacement of vitamin D and agrees to not increase her dose unless he discusses this with [place] first. Whitney Davenport agrees to follow up with our clinic in 2 weeks.  Pre-Diabetes Whitney Davenport will continue to work on weight loss, exercise, and decreasing simple carbohydrates in her diet to help decrease the risk of diabetes. We dicussed metformin including benefits and risks. She was informed that eating too many simple carbohydrates or too many calories at one sitting increases the likelihood of GI side effects. Whitney Davenport agrees to continue metformin for now and a prescription was not written today. We will check labs and Whitney Davenport agreed to follow up with [place] as directed to monitor her progress.  Diabetes risk counselling Whitney Davenport was given extended (15 minutes) diabetes prevention counseling today. She is 51 y.o. female and has risk factors for diabetes including obesity and pre-diabetes. We discussed intensive lifestyle modifications today with an emphasis on weight loss as well as increasing exercise and decreasing simple carbohydrates in her diet.  B12 deficiency Whitney Davenport  will work on increasing B12 rich foods in her diet. Whitney Davenport agrees to continue OTC B12 and a B12 supplementation was not prescribed today. We will plan on checking labs and Whitney Davenport agreed to follow up with our clinic in 2 weeks.  Hyperlipidemia Whitney Davenport was informed of the American Heart Association  Guidelines emphasizing intensive lifestyle modifications as the first line treatment for hyperlipidemia. We discussed many lifestyle modifications today in depth, and Whitney Davenport will continue to work on decreasing saturated fats such as fatty red meat, butter and many fried foods. She will also increase vegetables and lean protein in her diet and continue to work on exercise and weight loss efforts. Whitney Davenport agrees to continue to take statin for now, we will check labs and she will follow up with our clinic in 2 weeks.  Obesity Whitney Davenport is currently in the action stage of change. As such, her goal is to continue with weight loss efforts She has agreed to follow the Category 2 plan Whitney Davenport has been instructed to work up to a goal of 150 minutes of combined cardio and strengthening exercise per week for weight loss and overall health benefits. We discussed the following Behavioral Modification Strategies today: meal planning & cooking strategies, celebration eating strategies, increasing lean protein intake and decreasing simple carbohydrates   Whitney Davenport has agreed to follow up with our clinic in 2 weeks. She was informed of the importance of frequent follow up visits to maximize her success with intensive lifestyle modifications for her multiple health conditions.  I, Doreene Nest, am acting as transcriptionist for Dennard Nip, MD  I have reviewed the above documentation for accuracy and completeness, and I agree with the above. -Dennard Nip, MD  OBESITY BEHAVIORAL INTERVENTION VISIT  Today's visit was # 9 out of 22.  Starting weight: 216 lbs Starting date: 01/02/17 Today's weight : 189  lbs Today's date: 05/08/2017 Total lbs lost to date: 67 (Patients must lose 7 lbs in the first 6 months to continue with counseling)   ASK: We discussed the diagnosis of obesity with Whitney Davenport today and Glenetta agreed to give [place] permission to discuss obesity behavioral modification therapy today.  ASSESS: Allante has the diagnosis of obesity and her BMI today is 29.7 Belen is in the action stage of change   ADVISE: [place] was educated on the multiple health risks of obesity as well as the benefit of weight loss to improve her health. She was advised of the need for long term treatment and the importance of lifestyle modifications.  AGREE: Multiple dietary modification options and treatment options were discussed and  Zsazsa agreed to follow the Category 2 plan We discussed the following Behavioral Modification Strategies today: meal planning & cooking strategies, celebration eating strategies, increasing lean protein intake and decreasing simple carbohydrates

## 2017-05-09 LAB — LIPID PANEL WITH LDL/HDL RATIO
Cholesterol, Total: 176 mg/dL (ref 100–199)
HDL: 54 mg/dL (ref >39)
LDL Calculated: 100 mg/dL — ABNORMAL HIGH (ref 0–99)
LDL/HDL RATIO: 1.9 ratio (ref 0.0–3.2)
Triglycerides: 112 mg/dL (ref 0–149)
VLDL Cholesterol Cal: 22 mg/dL (ref 5–40)

## 2017-05-09 LAB — COMPREHENSIVE METABOLIC PANEL
A/G RATIO: 1.5 (ref 1.2–2.2)
ALT: 12 IU/L (ref 0–32)
AST: 15 IU/L (ref 0–40)
Albumin: 4.3 g/dL (ref 3.5–5.5)
Alkaline Phosphatase: 84 IU/L (ref 39–117)
BUN/Creatinine Ratio: 14 (ref 9–23)
BUN: 12 mg/dL (ref 6–24)
Bilirubin Total: <0.2 mg/dL (ref 0.0–1.2)
CALCIUM: 9 mg/dL (ref 8.7–10.2)
CO2: 24 mmol/L (ref 20–29)
Chloride: 103 mmol/L (ref 96–106)
Creatinine, Ser: 0.87 mg/dL (ref 0.57–1.00)
GFR calc Af Amer: 89 mL/min/{1.73_m2} (ref >59)
GFR calc non Af Amer: 77 mL/min/{1.73_m2} (ref >59)
Globulin, Total: 2.9 g/dL (ref 1.5–4.5)
Glucose: 102 mg/dL — ABNORMAL HIGH (ref 65–99)
Potassium: 4.5 mmol/L (ref 3.5–5.2)
Sodium: 143 mmol/L (ref 134–144)
Total Protein: 7.2 g/dL (ref 6.0–8.5)

## 2017-05-09 LAB — INSULIN, RANDOM: INSULIN: 20.1 u[IU]/mL (ref 2.6–24.9)

## 2017-05-09 LAB — VITAMIN D 25 HYDROXY (VIT D DEFICIENCY, FRACTURES): Vit D, 25-Hydroxy: 51.1 ng/mL (ref 30.0–100.0)

## 2017-05-09 LAB — VITAMIN B12: Vitamin B-12: 1302 pg/mL — ABNORMAL HIGH (ref 232–1245)

## 2017-05-09 LAB — HEMOGLOBIN A1C
ESTIMATED AVERAGE GLUCOSE: 114 mg/dL
HEMOGLOBIN A1C: 5.6 % (ref 4.8–5.6)

## 2017-05-10 MED FILL — MINIVELLE 0.075 MG PATCH: 0.075 | 28 days supply | Qty: 8 | Fill #12

## 2017-05-22 ENCOUNTER — Ambulatory Visit (INDEPENDENT_AMBULATORY_CARE_PROVIDER_SITE_OTHER): Payer: 59 | Admitting: Family Medicine

## 2017-05-22 VITALS — BP 100/67 | HR 60 | Temp 97.7°F | Ht 67.0 in | Wt 185.0 lb

## 2017-05-22 DIAGNOSIS — E559 Vitamin D deficiency, unspecified: Secondary | ICD-10-CM | POA: Diagnosis not present

## 2017-05-22 DIAGNOSIS — Z6830 Body mass index (BMI) 30.0-30.9, adult: Secondary | ICD-10-CM | POA: Diagnosis not present

## 2017-05-22 DIAGNOSIS — E669 Obesity, unspecified: Secondary | ICD-10-CM

## 2017-05-22 DIAGNOSIS — Z9189 Other specified personal risk factors, not elsewhere classified: Secondary | ICD-10-CM | POA: Diagnosis not present

## 2017-05-22 DIAGNOSIS — R7303 Prediabetes: Secondary | ICD-10-CM | POA: Diagnosis not present

## 2017-05-22 MED ORDER — METFORMIN HCL 500 MG PO TABS: 500.0000 mg | ORAL_TABLET | Freq: Every morning | ORAL | 0 refills | Status: DC

## 2017-05-22 MED FILL — metFORMIN HCL 500 MG TABS: 500 | 30 days supply | Qty: 30 | Fill #0

## 2017-05-22 NOTE — Progress Notes (Signed)
Office: [phone]  /  Fax: [phone]   HPI:   Chief Complaint: OBESITY Whitney Davenport is here to discuss her progress with her obesity treatment plan. She is on the Category 2 plan and is following her eating plan approximately 80 % of the time. She states she is walking for 30 minutes 2 times per week. Whitney Davenport continues to do very well with weight loss and exercise. She hasn't started strengthening exercise yet. Whitney Davenport is getting ready to go on vacation and has questions about travel eating strategies. Her weight is 185 lb (83.9 kg) today and has had a weight loss of 4 pounds over a period of 2 weeks since her last visit. She has lost 31 lbs since starting treatment with [place].  Vitamin D deficiency Whitney Davenport has a diagnosis of vitamin D deficiency. She is currently taking vit D and is now at goal. Fatigue is improved and she denies nausea, vomiting or muscle weakness.  Pre-Diabetes Whitney Davenport has a diagnosis of pre-diabetes and her Hgb A1c is improving nicely with diet, weight loss and metformin. She continues to work on diet, exercise and weight loss to decrease risk of diabetes. She denies nausea or hypoglycemia.  At risk for diabetes Whitney Davenport is at higher than average risk for developing diabetes due to her obesity. She currently denies polyuria or polydipsia.  ALLERGIES: Allergies  Allergen Reactions  . Sulfa Antibiotics Nausea And Vomiting and Other (See Comments)    Causes headache with nausea  . Hydrocodone Itching and Rash  . Zithromax [Azithromycin] Itching and Rash    MEDICATIONS: Current Outpatient Prescriptions on File Prior to Visit  Medication Sig Dispense Refill  . cetirizine (ZYRTEC) 10 MG tablet Take 10 mg by mouth at bedtime.     . clonazePAM (KLONOPIN) 0.5 MG tablet Take 0.5 mg by mouth 2 (two) times daily as needed for anxiety.     Marland Kitchen estradiol (VIVELLE-DOT) 0.075 MG/24HR Place 1 patch onto the skin 2 (two) times a week.    . progesterone (PROMETRIUM) 100  MG capsule Take 100 mg by mouth at bedtime.    . sertraline (ZOLOFT) 100 MG tablet Take 100 mg by mouth at bedtime.     . simvastatin (ZOCOR) 40 MG tablet Take 40 mg by mouth at bedtime.     . vitamin B-12 (CYANOCOBALAMIN) 1000 MCG tablet Take 1 tablet (1,000 mcg total) by mouth daily. 30 tablet 0  . Vitamin D, Ergocalciferol, (DRISDOL) 50000 units CAPS capsule Take 1 capsule (50,000 Units total) by mouth every 7 (seven) days. (Patient taking differently: Take 50,000 Units by mouth every 14 (fourteen) days. ) 4 capsule 3   No current facility-administered medications on file prior to visit.     PAST MEDICAL HISTORY: Past Medical History:  Diagnosis Date  . Allergy   . Anemia    hx of  . Anxiety   . Back injury    "ruptured disc-no problems since surgery"  . Back pain   . Constipation   . Depression   . GERD (gastroesophageal reflux disease)    occ  . Hepatitis 2004   hx of hep c-"interferon treatment, does not show up on blood test anymore"  . History of kidney stones   . Hyperlipidemia   . Hypotension    no fainting in years  . Infertility, female   . Lumbar disc disease   . Osteoarthritis   . Vitamin D deficiency     PAST SURGICAL HISTORY: Past Surgical History:  Procedure Laterality Date  .  DIAGNOSTIC LAPAROSCOPY  last 2001   for endometriosis, couple of surgeries  . GANGLION CYST EXCISION  age 51  . LUMBAR LAMINECTOMY/DECOMPRESSION MICRODISCECTOMY  11/03/2011   Procedure: LUMBAR LAMINECTOMY/DECOMPRESSION MICRODISCECTOMY;  Surgeon: Hosie Spangle, MD;  Location: [facility];  Service: Neurosurgery;  Laterality: Left;  LEFT Lumbar five sacral one laminotomy and microdiskectomy  . TOTAL HIP ARTHROPLASTY Right 02/03/2015   Procedure: RIGHT TOTAL HIP ARTHROPLASTY ANTERIOR APPROACH;  Surgeon: Paralee Cancel, MD;  Location: [facility];  Service: Orthopedics;  Laterality: Right;    SOCIAL HISTORY: Social History  Substance Use Topics  . Smoking status: Former Smoker     Years: 15.00    Types: Cigarettes    Quit date: 10/31/2010  . Smokeless tobacco: Never Used  . Alcohol use Yes     Comment: rare    FAMILY HISTORY: Family History  Problem Relation Age of Onset  . Thyroid disease Mother   . Diabetes Father   . Hyperlipidemia Father   . Obesity Father   . Cancer Maternal Grandmother     ROS: Review of Systems  Constitutional: Positive for weight loss.  Gastrointestinal: Negative for nausea and vomiting.  Genitourinary: Negative for frequency.  Musculoskeletal:       Negative muscle weakness  Endo/Heme/Allergies: Negative for polydipsia.       Negative hypoglycemia    PHYSICAL EXAM: Blood pressure 100/67, pulse 60, temperature 97.7 F (36.5 C), temperature source Oral, height 5\' 7"  (1.702 m), weight 185 lb (83.9 kg), SpO2 100 %. Body mass index is 28.98 kg/m. Physical Exam  Constitutional: She is oriented to person, place, and time. She appears well-developed and well-nourished.  Cardiovascular: Normal rate.   Pulmonary/Chest: Effort normal.  Musculoskeletal: Normal range of motion.  Neurological: She is oriented to person, place, and time.  Skin: Skin is warm and dry.  Psychiatric: She has a normal mood and affect. Her behavior is normal.  Vitals reviewed.   RECENT LABS AND TESTS: BMET    Component Value Date/Time   NA 143 05/08/2017 0810   K 4.5 05/08/2017 0810   CL 103 05/08/2017 0810   CO2 24 05/08/2017 0810   GLUCOSE 102 (H) 05/08/2017 0810   GLUCOSE 110 (H) 02/04/2015 0519   BUN 12 05/08/2017 0810   CREATININE 0.87 05/08/2017 0810   CALCIUM 9.0 05/08/2017 0810   GFRNONAA 77 05/08/2017 0810   GFRAA 89 05/08/2017 0810   Lab Results  Component Value Date   HGBA1C 5.6 05/08/2017   HGBA1C 5.9 (H) 01/02/2017   Lab Results  Component Value Date   INSULIN 20.1 05/08/2017   INSULIN 25.0 (H) 01/02/2017   CBC    Component Value Date/Time   WBC 6.8 01/02/2017 1007   WBC 13.1 (H) 02/04/2015 0519   RBC 4.32 01/02/2017  1007   RBC 3.71 (L) 02/04/2015 0519   HGB 11.6 01/02/2017 1007   HCT 37.1 01/02/2017 1007   PLT 226 02/04/2015 0519   MCV 86 01/02/2017 1007   MCH 26.9 01/02/2017 1007   MCH 27.5 02/04/2015 0519   MCHC 31.3 (L) 01/02/2017 1007   MCHC 30.6 02/04/2015 0519   RDW 15.2 01/02/2017 1007   LYMPHSABS 1.9 01/02/2017 1007   EOSABS 0.1 01/02/2017 1007   BASOSABS 0.0 01/02/2017 1007   Iron/TIBC/Ferritin/ %Sat No results found for: IRON, TIBC, FERRITIN, IRONPCTSAT Lipid Panel     Component Value Date/Time   CHOL 176 05/08/2017 0810   TRIG 112 05/08/2017 0810   HDL 54 05/08/2017 0810  [place] 100 (H) 05/08/2017 0810   Hepatic Function Panel     Component Value Date/Time   PROT 7.2 05/08/2017 0810   ALBUMIN 4.3 05/08/2017 0810   AST 15 05/08/2017 0810   ALT 12 05/08/2017 0810   ALKPHOS 84 05/08/2017 0810   BILITOT <0.2 05/08/2017 0810      Component Value Date/Time   TSH 4.280 01/02/2017 1007    ASSESSMENT AND PLAN: Prediabetes - Plan: metFORMIN (GLUCOPHAGE) 500 MG tablet  Vitamin D deficiency  At risk for diabetes mellitus  Class 1 obesity with serious comorbidity and body mass index (BMI) of 30.0 to 30.9 in adult, unspecified obesity type  PLAN:  Vitamin D Deficiency Whitney Davenport was informed that low vitamin D levels contributes to fatigue and are associated with obesity, breast, and colon cancer. She agrees to decrease prescription Vit D @50 ,000 IU to every 2 weeks and we will re-check labs in 3 months and will follow up for routine testing of vitamin D, at least 2-3 times per year. She was informed of the risk of over-replacement of vitamin D and agrees to not increase her dose unless he discusses this with [place] first. Whitney Davenport agrees to follow up with our clinic in 3 weeks.  Pre-Diabetes Tamera will continue to work on weight loss, exercise, and decreasing simple carbohydrates in her diet to help decrease the risk of diabetes. We dicussed metformin including benefits and  risks. She was informed that eating too many simple carbohydrates or too many calories at one sitting increases the likelihood of GI side effects. Whitney Davenport agrees to continue metformin and a prescription was written today for 1 month refill. Whitney Davenport agreed to follow up with [place] as directed to monitor her progress.  Diabetes risk counselling Whitney Davenport was given extended (15 minutes) diabetes prevention counseling today. She is 51 y.o. female and has risk factors for diabetes including obesity. We discussed intensive lifestyle modifications today with an emphasis on weight loss as well as increasing exercise and decreasing simple carbohydrates in her diet.  Obesity Whitney Davenport is currently in the action stage of change. As such, her goal is to continue with weight loss efforts She has agreed to follow the Category 2 plan Whitney Davenport has been instructed to work up to a goal of 150 minutes of combined cardio and strengthening exercise per week for weight loss and overall health benefits. We discussed the following Behavioral Modification Strategies today: travel eating strategies, increasing lean protein intake, decreasing simple carbohydrates  and decrease eating out  Whitney Davenport has agreed to follow up with our clinic in 3 weeks. She was informed of the importance of frequent follow up visits to maximize her success with intensive lifestyle modifications for her multiple health conditions.  I, Doreene Nest, am acting as transcriptionist for Dennard Nip, MD  I have reviewed the above documentation for accuracy and completeness, and I agree with the above. -Dennard Nip, MD  OBESITY BEHAVIORAL INTERVENTION VISIT  Today's visit was # 10 out of 22.  Starting weight: 216 lbs Starting date: 01/02/17 Today's weight : 185 lbs Today's date: 05/22/2017 Total lbs lost to date: 10 (Patients must lose 7 lbs in the first 6 months to continue with counseling)   ASK: We discussed the diagnosis of obesity  with Whitney Davenport today and Whitney Davenport agreed to give [place] permission to discuss obesity behavioral modification therapy today.  ASSESS: Whitney Davenport has the diagnosis of obesity and her BMI today is 40 Whitney Davenport is in the action stage of change  ADVISE: Babs was educated on the multiple health risks of obesity as well as the benefit of weight loss to improve her health. She was advised of the need for long term treatment and the importance of lifestyle modifications.  AGREE: Multiple dietary modification options and treatment options were discussed and  Whitney Davenport agreed to follow the Category 2 plan We discussed the following Behavioral Modification Strategies today: travel eating strategies, increasing lean protein intake, decreasing simple carbohydrates  and decrease eating out

## 2017-05-29 MED FILL — VIT D2 1.25 MG (50,000 UNIT: 1.25 MG | 28 days supply | Qty: 4 | Fill #1

## 2017-05-30 MED FILL — SERTRALINE HCL 100 MG TAB: 100 | 90 days supply | Qty: 90 | Fill #1

## 2017-05-31 MED FILL — PROGESTERONE 100 MG CAPSULE: 100 | 30 days supply | Qty: 30 | Fill #0

## 2017-06-05 ENCOUNTER — Encounter: Payer: Self-pay | Admitting: Gastroenterology

## 2017-06-05 DIAGNOSIS — N951 Menopausal and female climacteric states: Secondary | ICD-10-CM | POA: Diagnosis not present

## 2017-06-05 DIAGNOSIS — Z96641 Presence of right artificial hip joint: Secondary | ICD-10-CM | POA: Diagnosis not present

## 2017-06-05 DIAGNOSIS — E559 Vitamin D deficiency, unspecified: Secondary | ICD-10-CM | POA: Diagnosis not present

## 2017-06-05 DIAGNOSIS — E782 Mixed hyperlipidemia: Secondary | ICD-10-CM | POA: Diagnosis not present

## 2017-06-05 DIAGNOSIS — Z1211 Encounter for screening for malignant neoplasm of colon: Secondary | ICD-10-CM | POA: Diagnosis not present

## 2017-06-05 DIAGNOSIS — Z0000 Encounter for general adult medical examination without abnormal findings: Secondary | ICD-10-CM | POA: Diagnosis not present

## 2017-06-05 DIAGNOSIS — J309 Allergic rhinitis, unspecified: Secondary | ICD-10-CM | POA: Diagnosis not present

## 2017-06-05 DIAGNOSIS — Z8619 Personal history of other infectious and parasitic diseases: Secondary | ICD-10-CM | POA: Diagnosis not present

## 2017-06-05 DIAGNOSIS — F419 Anxiety disorder, unspecified: Secondary | ICD-10-CM | POA: Diagnosis not present

## 2017-06-07 DIAGNOSIS — Z1151 Encounter for screening for human papillomavirus (HPV): Secondary | ICD-10-CM | POA: Diagnosis not present

## 2017-06-07 DIAGNOSIS — Z01419 Encounter for gynecological examination (general) (routine) without abnormal findings: Secondary | ICD-10-CM | POA: Diagnosis not present

## 2017-06-07 DIAGNOSIS — Z1231 Encounter for screening mammogram for malignant neoplasm of breast: Secondary | ICD-10-CM | POA: Diagnosis not present

## 2017-06-12 ENCOUNTER — Ambulatory Visit (INDEPENDENT_AMBULATORY_CARE_PROVIDER_SITE_OTHER): Payer: 59 | Admitting: Family Medicine

## 2017-06-12 VITALS — BP 95/65 | HR 76 | Temp 98.6°F | Ht 67.0 in | Wt 186.0 lb

## 2017-06-12 DIAGNOSIS — R7303 Prediabetes: Secondary | ICD-10-CM

## 2017-06-12 DIAGNOSIS — Z6830 Body mass index (BMI) 30.0-30.9, adult: Secondary | ICD-10-CM | POA: Diagnosis not present

## 2017-06-12 DIAGNOSIS — E669 Obesity, unspecified: Secondary | ICD-10-CM

## 2017-06-12 NOTE — Progress Notes (Signed)
Office: [phone]  /  Fax: [phone]   HPI:   Chief Complaint: OBESITY Whitney Davenport is here to discuss her progress with her obesity treatment plan. She is on the Category 2 plan and is following her eating plan approximately 65 % of the time. She states she is exercising 0 minutes 0 times per week. Whitney Davenport had increased stress at home and increased emotional eating. She struggles with planning ahead of time as well. Her weight is 186 lb (84.4 kg) today and has had a weight gain of 1 pound over a period of 3 weeks since her last visit. She has lost 30 lbs since starting treatment with [place].  Pre-Diabetes Whitney Davenport has a diagnosis of pre-diabetes based on her elevated Hgb A1c and was informed this puts her at greater risk of developing diabetes. She is doing well overall with metformin and diet but she still notes increased pm polyphagia. She continues to work on diet and exercise to decrease risk of diabetes. She denies nausea or hypoglycemia.   ALLERGIES: Allergies  Allergen Reactions  . Sulfa Antibiotics Nausea And Vomiting and Other (See Comments)    Causes headache with nausea  . Hydrocodone Itching and Rash  . Zithromax [Azithromycin] Itching and Rash    MEDICATIONS: Current Outpatient Prescriptions on File Prior to Visit  Medication Sig Dispense Refill  . cetirizine (ZYRTEC) 10 MG tablet Take 10 mg by mouth at bedtime.     . clonazePAM (KLONOPIN) 0.5 MG tablet Take 0.5 mg by mouth 2 (two) times daily as needed for anxiety.     Marland Kitchen estradiol (VIVELLE-DOT) 0.075 MG/24HR Place 1 patch onto the skin 2 (two) times a week.    . metFORMIN (GLUCOPHAGE) 500 MG tablet Take 1 tablet (500 mg total) by mouth every morning. (Patient taking differently: Take 500 mg by mouth daily with lunch. ) 30 tablet 0  . progesterone (PROMETRIUM) 100 MG capsule Take 100 mg by mouth at bedtime.    . sertraline (ZOLOFT) 100 MG tablet Take 100 mg by mouth at bedtime.     . simvastatin (ZOCOR) 40 MG  tablet Take 40 mg by mouth at bedtime.     . vitamin B-12 (CYANOCOBALAMIN) 1000 MCG tablet Take 1 tablet (1,000 mcg total) by mouth daily. 30 tablet 0  . Vitamin D, Ergocalciferol, (DRISDOL) 50000 units CAPS capsule Take 1 capsule (50,000 Units total) by mouth every 7 (seven) days. (Patient taking differently: Take 50,000 Units by mouth every 14 (fourteen) days. ) 4 capsule 3   No current facility-administered medications on file prior to visit.     PAST MEDICAL HISTORY: Past Medical History:  Diagnosis Date  . Allergy   . Anemia    hx of  . Anxiety   . Back injury    "ruptured disc-no problems since surgery"  . Back pain   . Constipation   . Depression   . GERD (gastroesophageal reflux disease)    occ  . Hepatitis 2004   hx of hep c-"interferon treatment, does not show up on blood test anymore"  . History of kidney stones   . Hyperlipidemia   . Hypotension    no fainting in years  . Infertility, female   . Lumbar disc disease   . Osteoarthritis   . Vitamin D deficiency     PAST SURGICAL HISTORY: Past Surgical History:  Procedure Laterality Date  . DIAGNOSTIC LAPAROSCOPY  last 2001   for endometriosis, couple of surgeries  . GANGLION CYST EXCISION  age 67  .  LUMBAR LAMINECTOMY/DECOMPRESSION MICRODISCECTOMY  11/03/2011   Procedure: LUMBAR LAMINECTOMY/DECOMPRESSION MICRODISCECTOMY;  Surgeon: Hosie Spangle, MD;  Location: [facility];  Service: Neurosurgery;  Laterality: Left;  LEFT Lumbar five sacral one laminotomy and microdiskectomy  . TOTAL HIP ARTHROPLASTY Right 02/03/2015   Procedure: RIGHT TOTAL HIP ARTHROPLASTY ANTERIOR APPROACH;  Surgeon: Paralee Cancel, MD;  Location: [facility];  Service: Orthopedics;  Laterality: Right;    SOCIAL HISTORY: Social History  Substance Use Topics  . Smoking status: Former Smoker    Years: 15.00    Types: Cigarettes    Quit date: 10/31/2010  . Smokeless tobacco: Never Used  . Alcohol use Yes     Comment: rare    FAMILY  HISTORY: Family History  Problem Relation Age of Onset  . Thyroid disease Mother   . Diabetes Father   . Hyperlipidemia Father   . Obesity Father   . Cancer Maternal Grandmother     ROS: Review of Systems  Constitutional: Negative for weight loss.  Gastrointestinal: Negative for nausea.  Endo/Heme/Allergies:       Polyphagia Negative hypoglycemia  Psychiatric/Behavioral:       Stress    PHYSICAL EXAM: Blood pressure 95/65, pulse 76, temperature 98.6 F (37 C), temperature source Oral, height 5\' 7"  (1.702 m), weight 186 lb (84.4 kg), SpO2 98 %. Body mass index is 29.13 kg/m. Physical Exam  Constitutional: She is oriented to person, place, and time. She appears well-developed and well-nourished.  Cardiovascular: Normal rate.   Pulmonary/Chest: Effort normal.  Musculoskeletal: Normal range of motion.  Neurological: She is oriented to person, place, and time.  Skin: Skin is warm and dry.  Psychiatric: She has a normal mood and affect. Her behavior is normal.  Vitals reviewed.   RECENT LABS AND TESTS: BMET    Component Value Date/Time   NA 143 05/08/2017 0810   K 4.5 05/08/2017 0810   CL 103 05/08/2017 0810   CO2 24 05/08/2017 0810   GLUCOSE 102 (H) 05/08/2017 0810   GLUCOSE 110 (H) 02/04/2015 0519   BUN 12 05/08/2017 0810   CREATININE 0.87 05/08/2017 0810   CALCIUM 9.0 05/08/2017 0810   GFRNONAA 77 05/08/2017 0810   GFRAA 89 05/08/2017 0810   Lab Results  Component Value Date   HGBA1C 5.6 05/08/2017   HGBA1C 5.9 (H) 01/02/2017   Lab Results  Component Value Date   INSULIN 20.1 05/08/2017   INSULIN 25.0 (H) 01/02/2017   CBC    Component Value Date/Time   WBC 6.8 01/02/2017 1007   WBC 13.1 (H) 02/04/2015 0519   RBC 4.32 01/02/2017 1007   RBC 3.71 (L) 02/04/2015 0519   HGB 11.6 01/02/2017 1007   HCT 37.1 01/02/2017 1007   PLT 226 02/04/2015 0519   MCV 86 01/02/2017 1007   MCH 26.9 01/02/2017 1007   MCH 27.5 02/04/2015 0519   MCHC 31.3 (L)  01/02/2017 1007   MCHC 30.6 02/04/2015 0519   RDW 15.2 01/02/2017 1007   LYMPHSABS 1.9 01/02/2017 1007   EOSABS 0.1 01/02/2017 1007   BASOSABS 0.0 01/02/2017 1007   Iron/TIBC/Ferritin/ %Sat No results found for: IRON, TIBC, FERRITIN, IRONPCTSAT Lipid Panel     Component Value Date/Time   CHOL 176 05/08/2017 0810   TRIG 112 05/08/2017 0810   HDL 54 05/08/2017 0810   LDLCALC 100 (H) 05/08/2017 0810   Hepatic Function Panel     Component Value Date/Time   PROT 7.2 05/08/2017 0810   ALBUMIN 4.3 05/08/2017 0810   AST  15 05/08/2017 0810   ALT 12 05/08/2017 0810   ALKPHOS 84 05/08/2017 0810   BILITOT <0.2 05/08/2017 0810      Component Value Date/Time   TSH 4.280 01/02/2017 1007    ASSESSMENT AND PLAN: Prediabetes  Class 1 obesity without serious comorbidity with body mass index (BMI) of 30.0 to 30.9 in adult, unspecified obesity type - Patient BMI >30 when she started the program  PLAN:  Pre-Diabetes Whitney Davenport will continue to work on weight loss, exercise, and decreasing simple carbohydrates in her diet to help decrease the risk of diabetes. We dicussed metformin including benefits and risks. She was informed that eating too many simple carbohydrates or too many calories at one sitting increases the likelihood of GI side effects. Whitney Davenport agrees to continue metformin but change to lunch meal and decreased simple carbohydrates. We may need to increase dose if no improvement in the next 2 weeks. A prescription was not written today. Whitney Davenport agreed to follow up with [place] as directed to monitor her progress.  We spent > than 50% of the 15 minute visit on the counseling as documented in the note.  Obesity Whitney Davenport is currently in the action stage of change. As such, her goal is to continue with weight loss efforts She has agreed to change to keep a food journal with 1200 calories and 75+ grams of protein daily Whitney Davenport has been instructed to work up to a goal of 150 minutes  of combined cardio and strengthening exercise per week for weight loss and overall health benefits. We discussed the following Behavioral Modification Strategies today: decreasing simple carbohydrates and planning for success  Whitney Davenport has agreed to follow up with our clinic in 2 weeks. She was informed of the importance of frequent follow up visits to maximize her success with intensive lifestyle modifications for her multiple health conditions.  I, Doreene Nest, am acting as transcriptionist for Dennard Nip, MD  I have reviewed the above documentation for accuracy and completeness, and I agree with the above. -Dennard Nip, MD   OBESITY BEHAVIORAL INTERVENTION VISIT  Today's visit was # 11 out of 22.  Starting weight: 216 lbs Starting date: 01/02/17 Today's weight : 186 lbs Today's date: 06/12/2017 Total lbs lost to date: 30 (Patients must lose 7 lbs in the first 6 months to continue with counseling)   ASK: We discussed the diagnosis of obesity with Whitney Davenport today and Whitney Davenport agreed to give [place] permission to discuss obesity behavioral modification therapy today.  ASSESS: Whitney Davenport has the diagnosis of obesity and her BMI today is 29.12 Whitney Davenport is in the action stage of change   ADVISE: Whitney Davenport was educated on the multiple health risks of obesity as well as the benefit of weight loss to improve her health. She was advised of the need for long term treatment and the importance of lifestyle modifications.  AGREE: Multiple dietary modification options and treatment options were discussed and  Whitney Davenport agreed to change to keep a food journal with 1200 calories and 75+ grams of protein daily We discussed the following Behavioral Modification Strategies today: decreasing simple carbohydrates and planning for success

## 2017-06-20 ENCOUNTER — Other Ambulatory Visit (INDEPENDENT_AMBULATORY_CARE_PROVIDER_SITE_OTHER): Payer: Self-pay | Admitting: Family Medicine

## 2017-06-20 ENCOUNTER — Telehealth (INDEPENDENT_AMBULATORY_CARE_PROVIDER_SITE_OTHER): Payer: Self-pay | Admitting: Family Medicine

## 2017-06-20 DIAGNOSIS — R7303 Prediabetes: Secondary | ICD-10-CM

## 2017-06-20 MED FILL — SIMVASTATIN 40 MG TABLET: 40 | 90 days supply | Qty: 90 | Fill #0

## 2017-06-20 MED FILL — metFORMIN HCL 500 MG TABS: 500 | 30 days supply | Qty: 30 | Fill #0

## 2017-06-20 NOTE — Telephone Encounter (Signed)
metFORMIN (GLUCOPHAGE) 500 MG tablet  Call in today please [facility]

## 2017-06-20 NOTE — Telephone Encounter (Signed)
Sent the refill to her pharmacy. April, [place]

## 2017-06-23 MED FILL — MINIVELLE 0.075 MG PATCH: 0.075 | 28 days supply | Qty: 8 | Fill #0

## 2017-06-26 ENCOUNTER — Ambulatory Visit (INDEPENDENT_AMBULATORY_CARE_PROVIDER_SITE_OTHER): Payer: 59 | Admitting: Family Medicine

## 2017-06-26 VITALS — BP 104/70 | HR 77 | Temp 98.1°F | Ht 67.0 in | Wt 184.0 lb

## 2017-06-26 DIAGNOSIS — E669 Obesity, unspecified: Secondary | ICD-10-CM

## 2017-06-26 DIAGNOSIS — E559 Vitamin D deficiency, unspecified: Secondary | ICD-10-CM | POA: Diagnosis not present

## 2017-06-26 DIAGNOSIS — Z6830 Body mass index (BMI) 30.0-30.9, adult: Secondary | ICD-10-CM | POA: Diagnosis not present

## 2017-06-26 DIAGNOSIS — Z9189 Other specified personal risk factors, not elsewhere classified: Secondary | ICD-10-CM | POA: Diagnosis not present

## 2017-06-26 DIAGNOSIS — R7303 Prediabetes: Secondary | ICD-10-CM | POA: Diagnosis not present

## 2017-06-26 MED ORDER — VITAMIN D (ERGOCALCIFEROL) 1.25 MG (50000 UNIT) PO CAPS: 50000.0000 [IU] | ORAL_CAPSULE | ORAL | 0 refills | Status: DC

## 2017-06-26 MED FILL — VIT D2 1.25 MG (50,000 UNIT: 1.25 MG | 28 days supply | Qty: 2 | Fill #0

## 2017-06-26 NOTE — Progress Notes (Signed)
Office: [phone]  /  Fax: [phone]   HPI:   Chief Complaint: OBESITY Whitney Davenport is here to discuss her progress with her obesity treatment plan. She is on the Category 2 plan and is following her eating plan approximately 75 % of the time. She states she is exercising 0 minutes 0 times per week. Andreya continues to do well with weight loss. She is following the category 2 plan well overall but weekends are harder to plan meals ahead. Her weight is 184 lb (83.5 kg) today and has had a weight loss of 2 pounds over a period of 2 weeks since her last visit. She has lost 32 lbs since starting treatment with [place].  Vitamin D deficiency Whitney Davenport has a diagnosis of vitamin D deficiency. She is currently stable on vit D and denies nausea, vomiting or muscle weakness.  Pre-Diabetes Whitney Davenport has a diagnosis of pre-diabetes based on her elevated Hgb A1c and was informed this puts her at greater risk of developing diabetes. She started taking metformin and feels her polyphagia has improved with this change. Kimmy continues to work on diet and exercise to decrease risk of diabetes. She denies nausea or hypoglycemia.  At risk for diabetes Whitney Davenport is at higher than average risk for developing diabetes due to her obesity and pre-diabetes. She currently denies polyuria or polydipsia.   ALLERGIES: Allergies  Allergen Reactions  . Sulfa Antibiotics Nausea And Vomiting and Other (See Comments)    Causes headache with nausea  . Hydrocodone Itching and Rash  . Zithromax [Azithromycin] Itching and Rash    MEDICATIONS: Current Outpatient Prescriptions on File Prior to Visit  Medication Sig Dispense Refill  . cetirizine (ZYRTEC) 10 MG tablet Take 10 mg by mouth at bedtime.     . clonazePAM (KLONOPIN) 0.5 MG tablet Take 0.5 mg by mouth 2 (two) times daily as needed for anxiety.     Marland Kitchen estradiol (VIVELLE-DOT) 0.075 MG/24HR Place 1 patch onto the skin 2 (two) times a week.    . metFORMIN  (GLUCOPHAGE) 500 MG tablet TAKE 1 TABLET BY MOUTH EVERY MORNING. 30 tablet 0  . progesterone (PROMETRIUM) 100 MG capsule Take 100 mg by mouth at bedtime.    . sertraline (ZOLOFT) 100 MG tablet Take 100 mg by mouth at bedtime.     . simvastatin (ZOCOR) 40 MG tablet Take 40 mg by mouth at bedtime.     . vitamin B-12 (CYANOCOBALAMIN) 1000 MCG tablet Take 1 tablet (1,000 mcg total) by mouth daily. 30 tablet 0   No current facility-administered medications on file prior to visit.     PAST MEDICAL HISTORY: Past Medical History:  Diagnosis Date  . Allergy   . Anemia    hx of  . Anxiety   . Back injury    "ruptured disc-no problems since surgery"  . Back pain   . Constipation   . Depression   . GERD (gastroesophageal reflux disease)    occ  . Hepatitis 2004   hx of hep c-"interferon treatment, does not show up on blood test anymore"  . History of kidney stones   . Hyperlipidemia   . Hypotension    no fainting in years  . Infertility, female   . Lumbar disc disease   . Osteoarthritis   . Vitamin D deficiency     PAST SURGICAL HISTORY: Past Surgical History:  Procedure Laterality Date  . DIAGNOSTIC LAPAROSCOPY  last 2001   for endometriosis, couple of surgeries  . GANGLION CYST EXCISION  age 50  . LUMBAR LAMINECTOMY/DECOMPRESSION MICRODISCECTOMY  11/03/2011   Procedure: LUMBAR LAMINECTOMY/DECOMPRESSION MICRODISCECTOMY;  Surgeon: Hosie Spangle, MD;  Location: [facility];  Service: Neurosurgery;  Laterality: Left;  LEFT Lumbar five sacral one laminotomy and microdiskectomy  . TOTAL HIP ARTHROPLASTY Right 02/03/2015   Procedure: RIGHT TOTAL HIP ARTHROPLASTY ANTERIOR APPROACH;  Surgeon: Paralee Cancel, MD;  Location: [facility];  Service: Orthopedics;  Laterality: Right;    SOCIAL HISTORY: Social History  Substance Use Topics  . Smoking status: Former Smoker    Years: 15.00    Types: Cigarettes    Quit date: 10/31/2010  . Smokeless tobacco: Never Used  . Alcohol use Yes      Comment: rare    FAMILY HISTORY: Family History  Problem Relation Age of Onset  . Thyroid disease Mother   . Diabetes Father   . Hyperlipidemia Father   . Obesity Father   . Cancer Maternal Grandmother     ROS: Review of Systems  Constitutional: Positive for weight loss.  Gastrointestinal: Negative for nausea and vomiting.  Genitourinary: Negative for frequency.  Musculoskeletal:       Negative muscle weakness  Endo/Heme/Allergies: Negative for polydipsia.       Negative hypoglycemia    PHYSICAL EXAM: Blood pressure 104/70, pulse 77, temperature 98.1 F (36.7 C), temperature source Oral, height 5\' 7"  (1.702 m), weight 184 lb (83.5 kg), last menstrual period 06/18/2017, SpO2 99 %. Body mass index is 28.82 kg/m. Physical Exam  Constitutional: She is oriented to person, place, and time. She appears well-developed and well-nourished.  Cardiovascular: Normal rate.   Pulmonary/Chest: Effort normal.  Musculoskeletal: Normal range of motion.  Neurological: She is oriented to person, place, and time.  Skin: Skin is warm and dry.  Psychiatric: She has a normal mood and affect. Her behavior is normal.  Vitals reviewed.   RECENT LABS AND TESTS: BMET    Component Value Date/Time   NA 143 05/08/2017 0810   K 4.5 05/08/2017 0810   CL 103 05/08/2017 0810   CO2 24 05/08/2017 0810   GLUCOSE 102 (H) 05/08/2017 0810   GLUCOSE 110 (H) 02/04/2015 0519   BUN 12 05/08/2017 0810   CREATININE 0.87 05/08/2017 0810   CALCIUM 9.0 05/08/2017 0810   GFRNONAA 77 05/08/2017 0810   GFRAA 89 05/08/2017 0810   Lab Results  Component Value Date   HGBA1C 5.6 05/08/2017   HGBA1C 5.9 (H) 01/02/2017   Lab Results  Component Value Date   INSULIN 20.1 05/08/2017   INSULIN 25.0 (H) 01/02/2017   CBC    Component Value Date/Time   WBC 6.8 01/02/2017 1007   WBC 13.1 (H) 02/04/2015 0519   RBC 4.32 01/02/2017 1007   RBC 3.71 (L) 02/04/2015 0519   HGB 11.6 01/02/2017 1007   HCT 37.1  01/02/2017 1007   PLT 226 02/04/2015 0519   MCV 86 01/02/2017 1007   MCH 26.9 01/02/2017 1007   MCH 27.5 02/04/2015 0519   MCHC 31.3 (L) 01/02/2017 1007   MCHC 30.6 02/04/2015 0519   RDW 15.2 01/02/2017 1007   LYMPHSABS 1.9 01/02/2017 1007   EOSABS 0.1 01/02/2017 1007   BASOSABS 0.0 01/02/2017 1007   Iron/TIBC/Ferritin/ %Sat No results found for: IRON, TIBC, FERRITIN, IRONPCTSAT Lipid Panel     Component Value Date/Time   CHOL 176 05/08/2017 0810   TRIG 112 05/08/2017 0810   HDL 54 05/08/2017 0810   LDLCALC 100 (H) 05/08/2017 0810   Hepatic Function Panel  Component Value Date/Time   PROT 7.2 05/08/2017 0810   ALBUMIN 4.3 05/08/2017 0810   AST 15 05/08/2017 0810   ALT 12 05/08/2017 0810   ALKPHOS 84 05/08/2017 0810   BILITOT <0.2 05/08/2017 0810      Component Value Date/Time   TSH 4.280 01/02/2017 1007    ASSESSMENT AND PLAN: Prediabetes  Vitamin D deficiency - Plan: Vitamin D, Ergocalciferol, (DRISDOL) 50000 units CAPS capsule  At risk for diabetes mellitus  Class 1 obesity with serious comorbidity and body mass index (BMI) of 30.0 to 30.9 in adult, unspecified obesity type - Starting BMI greater than 30.  PLAN:  Vitamin D Deficiency Whitney Davenport was informed that low vitamin D levels contributes to fatigue and are associated with obesity, breast, and colon cancer. She agrees to continue to take prescription Vit D @50 ,000 IU every week, we will refill for 1 month and will follow up for routine testing of vitamin D, at least 2-3 times per year. She was informed of the risk of over-replacement of vitamin D and agrees to not increase her dose unless he discusses this with [place] first. Whitney Davenport agrees to follow up with our clinic in 3 weeks.  Pre-Diabetes Whitney Davenport will continue to work on weight loss, exercise, and decreasing simple carbohydrates in her diet to help decrease the risk of diabetes. We dicussed metformin including benefits and risks. She was informed  that eating too many simple carbohydrates or too many calories at one sitting increases the likelihood of GI side effects. Whitney Davenport agrees to continue metformin for now and a prescription was not written today. We will re-check labs in 1 month and Whitney Davenport agreed to follow up with [place] as directed to monitor her progress.  Diabetes risk counseling Whitney Davenport was given extended (15 minutes) diabetes prevention counseling today. She is 51 y.o. female and has risk factors for diabetes including obesity and pre-diabetes. We discussed intensive lifestyle modifications today with an emphasis on weight loss as well as increasing exercise and decreasing simple carbohydrates in her diet.  Obesity Whitney Davenport is currently in the action stage of change. As such, her goal is to continue with weight loss efforts She has agreed to follow the Category 2 plan Whitney Davenport has been instructed to work up to a goal of 150 minutes of combined cardio and strengthening exercise per week for weight loss and overall health benefits. We discussed the following Behavioral Modification Strategies today: planning for success, increasing lean protein intake, decreasing simple carbohydrates and work on meal planning and easy cooking plans  Whitney Davenport has agreed to follow up with our clinic in 3 weeks. She was informed of the importance of frequent follow up visits to maximize her success with intensive lifestyle modifications for her multiple health conditions.  I, Doreene Nest, am acting as transcriptionist for Dennard Nip, MD  I have reviewed the above documentation for accuracy and completeness, and I agree with the above. -Dennard Nip, MD  OBESITY BEHAVIORAL INTERVENTION VISIT  Today's visit was # 12 out of 22.  Starting weight: 216 lbs Starting date: 01/02/17 Today's weight : 184 lbs Today's date: 06/26/2017 Total lbs lost to date: 72 (Patients must lose 7 lbs in the first 6 months to continue with  counseling)   ASK: We discussed the diagnosis of obesity with Whitney Davenport today and Whitney Davenport agreed to give [place] permission to discuss obesity behavioral modification therapy today.  ASSESS: Whitney Davenport has the diagnosis of obesity and her BMI today is 28.81 Whitney Davenport is in the  action stage of change   ADVISE: Whitney Davenport was educated on the multiple health risks of obesity as well as the benefit of weight loss to improve her health. She was advised of the need for long term treatment and the importance of lifestyle modifications.  AGREE: Multiple dietary modification options and treatment options were discussed and  Whitney Davenport agreed to follow the Category 2 plan We discussed the following Behavioral Modification Strategies today: planning for success, increasing lean protein intake, decreasing simple carbohydrates and work on meal planning and easy cooking plans

## 2017-06-28 MED FILL — PROGESTERONE 100 MG CAPSULE: 100 | 30 days supply | Qty: 30 | Fill #0

## 2017-07-18 ENCOUNTER — Ambulatory Visit (INDEPENDENT_AMBULATORY_CARE_PROVIDER_SITE_OTHER): Payer: 59 | Admitting: Family Medicine

## 2017-07-18 VITALS — BP 98/66 | HR 62 | Temp 97.8°F | Ht 67.0 in | Wt 186.0 lb

## 2017-07-18 DIAGNOSIS — E559 Vitamin D deficiency, unspecified: Secondary | ICD-10-CM

## 2017-07-18 DIAGNOSIS — Z6830 Body mass index (BMI) 30.0-30.9, adult: Secondary | ICD-10-CM

## 2017-07-18 DIAGNOSIS — R7303 Prediabetes: Secondary | ICD-10-CM

## 2017-07-18 DIAGNOSIS — Z9189 Other specified personal risk factors, not elsewhere classified: Secondary | ICD-10-CM

## 2017-07-18 DIAGNOSIS — E669 Obesity, unspecified: Secondary | ICD-10-CM

## 2017-07-18 MED ORDER — VITAMIN D (ERGOCALCIFEROL) 1.25 MG (50000 UNIT) PO CAPS: 50000.0000 [IU] | ORAL_CAPSULE | ORAL | 0 refills | Status: DC

## 2017-07-18 MED ORDER — METFORMIN HCL 500 MG PO TABS: 500.0000 mg | ORAL_TABLET | Freq: Every morning | ORAL | 0 refills | Status: DC

## 2017-07-18 MED FILL — VIT D2 1.25 MG (50,000 UNIT: 1.25 MG | 28 days supply | Qty: 2 | Fill #0

## 2017-07-18 MED FILL — metFORMIN HCL 500 MG TABS: 500 | 30 days supply | Qty: 30 | Fill #0

## 2017-07-18 NOTE — Progress Notes (Signed)
Office: [phone]  /  Fax: [phone]   HPI:   Chief Complaint: OBESITY Whitney Davenport is here to discuss her progress with her obesity treatment plan. She is on the Category 2 plan and is following her eating plan approximately 60 % of the time. She states she is walking for 30 minutes 1 time per week. Whitney Davenport has noticed increased stress eating with increased financial problems. She was able to hike this weekend, which she hasn't been able to do in years. Her weight is 186 lb (84.4 kg) today and has had a weight gain of 2 pounds over a period of 3 weeks since her last visit. She has lost 30 lbs since starting treatment with [place].  Vitamin D deficiency Whitney Davenport has a diagnosis of vitamin D deficiency. She is currently stable on vit D and denies nausea, vomiting or muscle weakness.  Pre-Diabetes Whitney Davenport has a diagnosis of pre-diabetes based on her elevated Hgb A1c and was informed this puts her at greater risk of developing diabetes. She is stable on metformin currently and continues to work on diet and exercise to decrease risk of diabetes. She denies nausea or hypoglycemia.  At risk for diabetes Whitney Davenport is at higher than average risk for developing diabetes due to her obesity and pre-diabetes. She currently denies polyuria or polydipsia.   ALLERGIES: Allergies  Allergen Reactions  . Sulfa Antibiotics Nausea And Vomiting and Other (See Comments)    Causes headache with nausea  . Hydrocodone Itching and Rash  . Zithromax [Azithromycin] Itching and Rash    MEDICATIONS: Current Outpatient Prescriptions on File Prior to Visit  Medication Sig Dispense Refill  . cetirizine (ZYRTEC) 10 MG tablet Take 10 mg by mouth at bedtime.     . clonazePAM (KLONOPIN) 0.5 MG tablet Take 0.5 mg by mouth 2 (two) times daily as needed for anxiety.     Marland Kitchen estradiol (VIVELLE-DOT) 0.075 MG/24HR Place 1 patch onto the skin 2 (two) times a week.    . metFORMIN (GLUCOPHAGE) 500 MG tablet TAKE 1 TABLET  BY MOUTH EVERY MORNING. 30 tablet 0  . progesterone (PROMETRIUM) 100 MG capsule Take 100 mg by mouth at bedtime.    . sertraline (ZOLOFT) 100 MG tablet Take 100 mg by mouth at bedtime.     . simvastatin (ZOCOR) 40 MG tablet Take 40 mg by mouth at bedtime.     . vitamin B-12 (CYANOCOBALAMIN) 1000 MCG tablet Take 1 tablet (1,000 mcg total) by mouth daily. 30 tablet 0  . Vitamin D, Ergocalciferol, (DRISDOL) 50000 units CAPS capsule Take 1 capsule (50,000 Units total) by mouth every 14 (fourteen) days. 2 capsule 0   No current facility-administered medications on file prior to visit.     PAST MEDICAL HISTORY: Past Medical History:  Diagnosis Date  . Allergy   . Anemia    hx of  . Anxiety   . Back injury    "ruptured disc-no problems since surgery"  . Back pain   . Constipation   . Depression   . GERD (gastroesophageal reflux disease)    occ  . Hepatitis 2004   hx of hep c-"interferon treatment, does not show up on blood test anymore"  . History of kidney stones   . Hyperlipidemia   . Hypotension    no fainting in years  . Infertility, female   . Lumbar disc disease   . Osteoarthritis   . Vitamin D deficiency     PAST SURGICAL HISTORY: Past Surgical History:  Procedure Laterality  Date  . DIAGNOSTIC LAPAROSCOPY  last 2001   for endometriosis, couple of surgeries  . GANGLION CYST EXCISION  age 35  . LUMBAR LAMINECTOMY/DECOMPRESSION MICRODISCECTOMY  11/03/2011   Procedure: LUMBAR LAMINECTOMY/DECOMPRESSION MICRODISCECTOMY;  Surgeon: Hosie Spangle, MD;  Location: [facility];  Service: Neurosurgery;  Laterality: Left;  LEFT Lumbar five sacral one laminotomy and microdiskectomy  . TOTAL HIP ARTHROPLASTY Right 02/03/2015   Procedure: RIGHT TOTAL HIP ARTHROPLASTY ANTERIOR APPROACH;  Surgeon: Paralee Cancel, MD;  Location: [facility];  Service: Orthopedics;  Laterality: Right;    SOCIAL HISTORY: Social History  Substance Use Topics  . Smoking status: Former Smoker    Years: 15.00      Types: Cigarettes    Quit date: 10/31/2010  . Smokeless tobacco: Never Used  . Alcohol use Yes     Comment: rare    FAMILY HISTORY: Family History  Problem Relation Age of Onset  . Thyroid disease Mother   . Diabetes Father   . Hyperlipidemia Father   . Obesity Father   . Cancer Maternal Grandmother     ROS: Review of Systems  Constitutional: Negative for weight loss.  Gastrointestinal: Negative for nausea and vomiting.  Genitourinary: Negative for frequency.  Musculoskeletal:       Negative muscle weakness  Endo/Heme/Allergies: Negative for polydipsia.       Negative hypoglycemia    PHYSICAL EXAM: Blood pressure 98/66, pulse 62, temperature 97.8 F (36.6 C), height 5\' 7"  (1.702 m), weight 186 lb (84.4 kg), last menstrual period 06/18/2017, SpO2 100 %. Body mass index is 29.13 kg/m. Physical Exam  Constitutional: She is oriented to person, place, and time. She appears well-developed and well-nourished.  Cardiovascular: Normal rate.   Pulmonary/Chest: Effort normal.  Musculoskeletal: Normal range of motion.  Neurological: She is oriented to person, place, and time.  Skin: Skin is warm and dry.  Psychiatric: She has a normal mood and affect. Her behavior is normal.  Vitals reviewed.   RECENT LABS AND TESTS: BMET    Component Value Date/Time   NA 143 05/08/2017 0810   K 4.5 05/08/2017 0810   CL 103 05/08/2017 0810   CO2 24 05/08/2017 0810   GLUCOSE 102 (H) 05/08/2017 0810   GLUCOSE 110 (H) 02/04/2015 0519   BUN 12 05/08/2017 0810   CREATININE 0.87 05/08/2017 0810   CALCIUM 9.0 05/08/2017 0810   GFRNONAA 77 05/08/2017 0810   GFRAA 89 05/08/2017 0810   Lab Results  Component Value Date   HGBA1C 5.6 05/08/2017   HGBA1C 5.9 (H) 01/02/2017   Lab Results  Component Value Date   INSULIN 20.1 05/08/2017   INSULIN 25.0 (H) 01/02/2017   CBC    Component Value Date/Time   WBC 6.8 01/02/2017 1007   WBC 13.1 (H) 02/04/2015 0519   RBC 4.32 01/02/2017 1007    RBC 3.71 (L) 02/04/2015 0519   HGB 11.6 01/02/2017 1007   HCT 37.1 01/02/2017 1007   PLT 226 02/04/2015 0519   MCV 86 01/02/2017 1007   MCH 26.9 01/02/2017 1007   MCH 27.5 02/04/2015 0519   MCHC 31.3 (L) 01/02/2017 1007   MCHC 30.6 02/04/2015 0519   RDW 15.2 01/02/2017 1007   LYMPHSABS 1.9 01/02/2017 1007   EOSABS 0.1 01/02/2017 1007   BASOSABS 0.0 01/02/2017 1007   Iron/TIBC/Ferritin/ %Sat No results found for: IRON, TIBC, FERRITIN, IRONPCTSAT Lipid Panel     Component Value Date/Time   CHOL 176 05/08/2017 0810   TRIG 112 05/08/2017 0810  HDL 54 05/08/2017 0810   LDLCALC 100 (H) 05/08/2017 0810   Hepatic Function Panel     Component Value Date/Time   PROT 7.2 05/08/2017 0810   ALBUMIN 4.3 05/08/2017 0810   AST 15 05/08/2017 0810   ALT 12 05/08/2017 0810   ALKPHOS 84 05/08/2017 0810   BILITOT <0.2 05/08/2017 0810      Component Value Date/Time   TSH 4.280 01/02/2017 1007    ASSESSMENT AND PLAN: Vitamin D deficiency - Plan: Vitamin D, Ergocalciferol, (DRISDOL) 50000 units CAPS capsule  Prediabetes - Plan: metFORMIN (GLUCOPHAGE) 500 MG tablet  At risk for diabetes mellitus  Class 1 obesity with serious comorbidity and body mass index (BMI) of 30.0 to 30.9 in adult, unspecified obesity type - Starting BMI greater than 30  PLAN:  Vitamin D Deficiency Naliyah was informed that low vitamin D levels contributes to fatigue and are associated with obesity, breast, and colon cancer. She agrees to continue to take prescription Vit D @50 ,000 IU every week, we will refill for 1 month and will follow up for routine testing of vitamin D, at least 2-3 times per year. She was informed of the risk of over-replacement of vitamin D and agrees to not increase her dose unless he discusses this with [place] first. Jarae agrees to follow up with our clinic in 3 to 4 weeks.  Pre-Diabetes Florencia will continue to work on weight loss, exercise, and decreasing simple carbohydrates in  her diet to help decrease the risk of diabetes. We dicussed metformin including benefits and risks. She was informed that eating too many simple carbohydrates or too many calories at one sitting increases the likelihood of GI side effects. Norvella agrees to continue  metformin for now and a prescription was written today for 1 month refill. Altamese agreed to follow up with [place] as directed to monitor her progress.  Diabetes risk counseling Ariona was given extended (15 minutes) diabetes prevention counseling today. She is 51 y.o. female and has risk factors for diabetes including obesity and pre-diabetes. We discussed intensive lifestyle modifications today with an emphasis on weight loss as well as increasing exercise and decreasing simple carbohydrates in her diet.  Obesity Kariana is currently in the action stage of change. As such, her goal is to continue with weight loss efforts She has agreed to follow the Category 2 plan Mikhaela has been instructed to work up to a goal of 150 minutes of combined cardio and strengthening exercise per week for weight loss and overall health benefits. We discussed the following Behavioral Modification Strategies today: increasing lean protein intake, decreasing simple carbohydrates  and emotional eating strategies  Rilda has agreed to follow up with our clinic in 3 to 4 weeks. She was informed of the importance of frequent follow up visits to maximize her success with intensive lifestyle modifications for her multiple health conditions.  I, Doreene Nest, am acting as transcriptionist for Dennard Nip, MD  I have reviewed the above documentation for accuracy and completeness, and I agree with the above. -Dennard Nip, MD    OBESITY BEHAVIORAL INTERVENTION VISIT  Today's visit was # 13 out of 22.  Starting weight: 216 lbs Starting date: 01/02/17 Today's weight : 186 lbs  Today's date: 07/18/2017 Total lbs lost to date: 30 (Patients must  lose 7 lbs in the first 6 months to continue with counseling)   ASK: We discussed the diagnosis of obesity with Yevette Edwards today and Farran agreed to give [place] permission to discuss  obesity behavioral modification therapy today.  ASSESS: Titianna has the diagnosis of obesity and her BMI today is 29.12 Shiron is in the action stage of change   ADVISE: Willella was educated on the multiple health risks of obesity as well as the benefit of weight loss to improve her health. She was advised of the need for long term treatment and the importance of lifestyle modifications.  AGREE: Multiple dietary modification options and treatment options were discussed and  Kailea agreed to follow the Category 2 plan We discussed the following Behavioral Modification Strategies today: increasing lean protein intake, decreasing simple carbohydrates  and emotional eating strategies

## 2017-07-21 ENCOUNTER — Encounter: Payer: Self-pay | Admitting: Gastroenterology

## 2017-07-21 ENCOUNTER — Ambulatory Visit (AMBULATORY_SURGERY_CENTER): Payer: Self-pay | Admitting: *Deleted

## 2017-07-21 VITALS — Ht 66.5 in | Wt 188.6 lb

## 2017-07-21 DIAGNOSIS — Z1211 Encounter for screening for malignant neoplasm of colon: Secondary | ICD-10-CM

## 2017-07-21 MED ORDER — NA SULFATE-K SULFATE-MG SULF 17.5-3.13-1.6 GM/177ML PO SOLN: ORAL | 0 refills | Status: DC

## 2017-07-21 MED FILL — SUPREP BOWEL PREP KIT: 17.5-3.13-1 | 2 days supply | Qty: 354 | Fill #0

## 2017-07-21 NOTE — Progress Notes (Signed)
No allergies to eggs or soy. No problems with anesthesia.  Pt given Emmi instructions for colonoscopy  No oxygen use  No diet drug use  

## 2017-07-25 MED FILL — PROGESTERONE 100 MG CAPSULE: 100 | 30 days supply | Qty: 30 | Fill #1

## 2017-07-25 MED FILL — MINIVELLE 0.075 MG PATCH: 0.075 | 28 days supply | Qty: 8 | Fill #1

## 2017-08-04 ENCOUNTER — Ambulatory Visit (AMBULATORY_SURGERY_CENTER): Payer: 59 | Admitting: Gastroenterology

## 2017-08-04 ENCOUNTER — Encounter: Payer: Self-pay | Admitting: Gastroenterology

## 2017-08-04 VITALS — BP 101/66 | HR 62 | Temp 98.6°F | Resp 11 | Ht 66.5 in | Wt 188.0 lb

## 2017-08-04 DIAGNOSIS — Z1211 Encounter for screening for malignant neoplasm of colon: Secondary | ICD-10-CM

## 2017-08-04 DIAGNOSIS — Z1212 Encounter for screening for malignant neoplasm of rectum: Secondary | ICD-10-CM | POA: Diagnosis not present

## 2017-08-04 DIAGNOSIS — E119 Type 2 diabetes mellitus without complications: Secondary | ICD-10-CM | POA: Diagnosis not present

## 2017-08-04 DIAGNOSIS — M545 Low back pain: Secondary | ICD-10-CM | POA: Diagnosis not present

## 2017-08-04 MED ORDER — SODIUM CHLORIDE 0.9 % IV SOLN: 500.0000 mL | INTRAVENOUS | Status: DC

## 2017-08-04 NOTE — Op Note (Signed)
Whitney Davenport: Whitney Davenport Procedure Date: 08/04/2017 2:07 PM MRN: [id number] Endoscopist: Mauri Pole , MD Age: 51 Referring MD:  Date of Birth: 1966-09-07 Gender: Female Account #: [id number] Procedure:                Colonoscopy Indications:              Screening for colorectal malignant neoplasm, This                            is the patient's first colonoscopy Medicines:                Monitored Anesthesia Care Procedure:                Pre-Anesthesia Assessment:                           - Prior to the procedure, a History and Physical                            was performed, and patient medications and                            allergies were reviewed. The patient's tolerance of                            previous anesthesia was also reviewed. The risks                            and benefits of the procedure and the sedation                            options and risks were discussed with the patient.                            All questions were answered, and informed consent                            was obtained. Prior Anticoagulants: The patient has                            taken no previous anticoagulant or antiplatelet                            agents. ASA Grade Assessment: II - A patient with                            mild systemic disease. After reviewing the risks                            and benefits, the patient was deemed in                            satisfactory condition to undergo the procedure.  After obtaining informed consent, the colonoscope                            was passed under direct vision. Throughout the                            procedure, the patient's blood pressure, pulse, and                            oxygen saturations were monitored continuously. The                            Model [id number]-[id number] scope was introduced                            through the anus  and advanced to the the cecum,                            identified by appendiceal orifice and ileocecal                            valve. The colonoscopy was performed without                            difficulty. The patient tolerated the procedure                            well. The quality of the bowel preparation was                            excellent. The ileocecal valve, appendiceal                            orifice, and rectum were photographed. Scope In: 2:10:36 PM Scope Out: 2:25:01 PM Scope Withdrawal Time: 0 hours 8 minutes 40 seconds  Total Procedure Duration: 0 hours 14 minutes 25 seconds  Findings:                 The perianal and digital rectal examinations were                            normal.                           Non-bleeding internal hemorrhoids were found during                            retroflexion. The hemorrhoids were small.                           The exam was otherwise without abnormality. Complications:            No immediate complications. Estimated Blood Loss:     Estimated blood loss: none. Impression:               - Non-bleeding internal hemorrhoids.                           -  The examination was otherwise normal.                           - No specimens collected. Recommendation:           - Patient has a contact number available for                            emergencies. The signs and symptoms of potential                            delayed complications were discussed with the                            patient. Return to normal activities tomorrow.                            Written discharge instructions were provided to the                            patient.                           - Resume previous diet.                           - Continue present medications.                           - Repeat colonoscopy in 10 years for screening                            purposes. Mauri Pole, MD 08/04/2017 2:30:44 PM This  report has been signed electronically.

## 2017-08-04 NOTE — Progress Notes (Signed)
Pt's states no medical or surgical changes since previsit or office visit. 

## 2017-08-04 NOTE — Progress Notes (Signed)
Report to PACU, RN, vss, BBS= Clear.  

## 2017-08-07 ENCOUNTER — Telehealth: Payer: Self-pay

## 2017-08-07 NOTE — Telephone Encounter (Signed)
  Follow up Call-  Call Abubakar Crispo number 08/04/2017  Post procedure Call Donivin Wirt phone  # [phone]  Permission to leave phone message Yes  Some recent data might be hidden     Patient questions:  Do you have a fever, pain , or abdominal swelling? No. Pain Score  0 *  Have you tolerated food without any problems? Yes.    Have you been able to return to your normal activities? Yes.    Do you have any questions about your discharge instructions: Diet   No. Medications  No. Follow up visit  No.  Do you have questions or concerns about your Care? No.  Actions: * If pain score is 4 or above: No action needed, pain <4.

## 2017-08-09 ENCOUNTER — Ambulatory Visit (INDEPENDENT_AMBULATORY_CARE_PROVIDER_SITE_OTHER): Payer: 59 | Admitting: Family Medicine

## 2017-08-09 VITALS — BP 104/73 | HR 68 | Temp 98.2°F | Ht 67.0 in | Wt 184.0 lb

## 2017-08-09 DIAGNOSIS — E559 Vitamin D deficiency, unspecified: Secondary | ICD-10-CM

## 2017-08-09 DIAGNOSIS — E7849 Other hyperlipidemia: Secondary | ICD-10-CM

## 2017-08-09 DIAGNOSIS — R7303 Prediabetes: Secondary | ICD-10-CM

## 2017-08-09 DIAGNOSIS — Z6830 Body mass index (BMI) 30.0-30.9, adult: Secondary | ICD-10-CM

## 2017-08-09 DIAGNOSIS — E669 Obesity, unspecified: Secondary | ICD-10-CM

## 2017-08-09 DIAGNOSIS — Z9189 Other specified personal risk factors, not elsewhere classified: Secondary | ICD-10-CM

## 2017-08-09 MED ORDER — METFORMIN HCL 500 MG PO TABS: 500.0000 mg | ORAL_TABLET | Freq: Every morning | ORAL | 0 refills | Status: DC

## 2017-08-09 MED ORDER — VITAMIN D (ERGOCALCIFEROL) 1.25 MG (50000 UNIT) PO CAPS: 50000.0000 [IU] | ORAL_CAPSULE | ORAL | 0 refills | Status: DC

## 2017-08-09 NOTE — Progress Notes (Signed)
Office: [phone]  /  Fax: [phone]   HPI:   Chief Complaint: OBESITY Whitney Davenport is here to discuss her progress with her obesity treatment plan. She is on the Category 2 plan and is following her eating plan approximately 60 % of the time. She states she is walking for 20 minutes 2 times per week. Whitney Davenport continues to do well with weight loss. She has had extra challenges recently and doesn't always follow her plan closely but is making good choices overall. Whitney Davenport still increased her lean protein intake and vegetables. She decreased her simple carbohydrates and mindless snacking. Her weight is 184 lb (83.5 kg) today and has had a weight loss of 2 pounds over a period of 3 weeks since her last visit. She has lost 32 lbs since starting treatment with [place].  Vitamin D deficiency Whitney Davenport has a diagnosis of vitamin D deficiency. She is currently taking vit D and denies nausea, vomiting or muscle weakness.  Pre-Diabetes Whitney Davenport has a diagnosis of pre-diabetes based on her elevated Hgb A1c and was informed this puts her at greater risk of developing diabetes. She is taking metformin currently and continues to work on diet and exercise to decrease risk of diabetes. She denies nausea or hypoglycemia.  At risk for diabetes Whitney Davenport is at higher than average risk for developing diabetes due to her obesity and pre-diabetes. She currently denies polyuria or polydipsia.   ALLERGIES: Allergies  Allergen Reactions   Sulfa Antibiotics Nausea And Vomiting and Other (See Comments)    Causes headache with nausea   Hydrocodone Itching and Rash   Zithromax [Azithromycin] Itching and Rash    MEDICATIONS: Current Outpatient Prescriptions on File Prior to Visit  Medication Sig Dispense Refill   cetirizine (ZYRTEC) 10 MG tablet Take 10 mg by mouth at bedtime.      clonazePAM (KLONOPIN) 0.5 MG tablet Take 0.5 mg by mouth 2 (two) times daily as needed for anxiety.      estradiol  (VIVELLE-DOT) 0.075 MG/24HR Place 1 patch onto the skin 2 (two) times a week.     metFORMIN (GLUCOPHAGE) 500 MG tablet Take 1 tablet (500 mg total) by mouth every morning. 30 tablet 0   progesterone (PROMETRIUM) 100 MG capsule Take 100 mg by mouth at bedtime.     sertraline (ZOLOFT) 100 MG tablet Take 100 mg by mouth at bedtime.      simvastatin (ZOCOR) 40 MG tablet Take 40 mg by mouth at bedtime.      vitamin B-12 (CYANOCOBALAMIN) 1000 MCG tablet Take 1 tablet (1,000 mcg total) by mouth daily. 30 tablet 0   Vitamin D, Ergocalciferol, (DRISDOL) 50000 units CAPS capsule Take 1 capsule (50,000 Units total) by mouth every 14 (fourteen) days. 2 capsule 0   No current facility-administered medications on file prior to visit.     PAST MEDICAL HISTORY: Past Medical History:  Diagnosis Date   Allergy    Anemia    hx of   Anxiety    Back injury    "ruptured disc-no problems since surgery"   Back pain    Constipation    Depression    GERD (gastroesophageal reflux disease)    occ   Hepatitis 2004   hx of hep c-"interferon treatment, does not show up on blood test anymore"   History of kidney stones    Hyperlipidemia    Hypotension    no fainting in years   Infertility, female    Lumbar disc disease    Osteoarthritis  Vitamin D deficiency     PAST SURGICAL HISTORY: Past Surgical History:  Procedure Laterality Date   DIAGNOSTIC LAPAROSCOPY  last 2001   for endometriosis, couple of surgeries   GANGLION CYST EXCISION  age 7   LUMBAR LAMINECTOMY/DECOMPRESSION MICRODISCECTOMY  11/03/2011   Procedure: LUMBAR LAMINECTOMY/DECOMPRESSION MICRODISCECTOMY;  Surgeon: Hosie Spangle, MD;  Location: [facility];  Service: Neurosurgery;  Laterality: Left;  LEFT Lumbar five sacral one laminotomy and microdiskectomy   TOTAL HIP ARTHROPLASTY Right 02/03/2015   Procedure: RIGHT TOTAL HIP ARTHROPLASTY ANTERIOR APPROACH;  Surgeon: Paralee Cancel, MD;  Location: [facility];   Service: Orthopedics;  Laterality: Right;    SOCIAL HISTORY: Social History  Substance Use Topics   Smoking status: Former Smoker    Years: 15.00    Types: Cigarettes    Quit date: 10/31/2010   Smokeless tobacco: Never Used   Alcohol use Yes     Comment: rare    FAMILY HISTORY: Family History  Problem Relation Age of Onset   Thyroid disease Mother    Diabetes Father    Hyperlipidemia Father    Obesity Father    Cancer Maternal Grandmother    Colon cancer Neg Hx     ROS: Review of Systems  Constitutional: Positive for weight loss.  Gastrointestinal: Negative for nausea and vomiting.  Genitourinary: Negative for frequency.  Musculoskeletal:       Negative muscle weakness  Endo/Heme/Allergies: Negative for polydipsia.       Negative hypoglycemia    PHYSICAL EXAM: Blood pressure 104/73, pulse 68, temperature 98.2 F (36.8 C), temperature source Oral, height 5\' 7"  (1.702 m), weight 184 lb (83.5 kg), last menstrual period 07/20/2017, SpO2 99 %. Body mass index is 28.82 kg/m. Physical Exam  Constitutional: She is oriented to person, place, and time. She appears well-developed and well-nourished.  Cardiovascular: Normal rate.   Pulmonary/Chest: Effort normal.  Musculoskeletal: Normal range of motion.  Neurological: She is oriented to person, place, and time.  Skin: Skin is warm and dry.  Psychiatric: She has a normal mood and affect. Her behavior is normal.  Vitals reviewed.   RECENT LABS AND TESTS: BMET    Component Value Date/Time   NA 143 05/08/2017 0810   K 4.5 05/08/2017 0810   CL 103 05/08/2017 0810   CO2 24 05/08/2017 0810   GLUCOSE 102 (H) 05/08/2017 0810   GLUCOSE 110 (H) 02/04/2015 0519   BUN 12 05/08/2017 0810   CREATININE 0.87 05/08/2017 0810   CALCIUM 9.0 05/08/2017 0810   GFRNONAA 77 05/08/2017 0810   GFRAA 89 05/08/2017 0810   Lab Results  Component Value Date   HGBA1C 5.6 05/08/2017   HGBA1C 5.9 (H) 01/02/2017   Lab Results    Component Value Date   INSULIN 20.1 05/08/2017   INSULIN 25.0 (H) 01/02/2017   CBC    Component Value Date/Time   WBC 6.8 01/02/2017 1007   WBC 13.1 (H) 02/04/2015 0519   RBC 4.32 01/02/2017 1007   RBC 3.71 (L) 02/04/2015 0519   HGB 11.6 01/02/2017 1007   HCT 37.1 01/02/2017 1007   PLT 226 02/04/2015 0519   MCV 86 01/02/2017 1007   MCH 26.9 01/02/2017 1007   MCH 27.5 02/04/2015 0519   MCHC 31.3 (L) 01/02/2017 1007   MCHC 30.6 02/04/2015 0519   RDW 15.2 01/02/2017 1007   LYMPHSABS 1.9 01/02/2017 1007   EOSABS 0.1 01/02/2017 1007   BASOSABS 0.0 01/02/2017 1007   Iron/TIBC/Ferritin/ %Sat No results found for: IRON,  TIBC, FERRITIN, IRONPCTSAT Lipid Panel     Component Value Date/Time   CHOL 176 05/08/2017 0810   TRIG 112 05/08/2017 0810   HDL 54 05/08/2017 0810   LDLCALC 100 (H) 05/08/2017 0810   Hepatic Function Panel     Component Value Date/Time   PROT 7.2 05/08/2017 0810   ALBUMIN 4.3 05/08/2017 0810   AST 15 05/08/2017 0810   ALT 12 05/08/2017 0810   ALKPHOS 84 05/08/2017 0810   BILITOT <0.2 05/08/2017 0810      Component Value Date/Time   TSH 4.280 01/02/2017 1007    ASSESSMENT AND PLAN: Vitamin D deficiency - Plan: VITAMIN D 25 Hydroxy (Vit-D Deficiency, Fractures), Vitamin B12, Vitamin D, Ergocalciferol, (DRISDOL) 50000 units CAPS capsule  Prediabetes - Plan: Comprehensive metabolic panel, CBC With Differential, Hemoglobin A1c, Vitamin B12, Insulin, random, metFORMIN (GLUCOPHAGE) 500 MG tablet  Other hyperlipidemia - Plan: Lipid Panel With LDL/HDL Ratio  At risk for diabetes mellitus  Class 1 obesity with serious comorbidity and body mass index (BMI) of 30.0 to 30.9 in adult, unspecified obesity type - Starting BMI greater then 30  PLAN:  Vitamin D Deficiency Adalyna was informed that low vitamin D levels contributes to fatigue and are associated with obesity, breast, and colon cancer. She agrees to continue to take prescription Vit D @50 ,000 IU  every 14 days #2 with no refills and will follow up for routine testing of vitamin D, at least 2-3 times per year. She was informed of the risk of over-replacement of vitamin D and agrees to not increase her dose unless he discusses this with [place] first. Shiri agrees to follow up with our clinic in 2 to 3 weeks.  Pre-Diabetes Akayla will continue to work on weight loss, exercise, and decreasing simple carbohydrates in her diet to help decrease the risk of diabetes. We dicussed metformin including benefits and risks. She was informed that eating too many simple carbohydrates or too many calories at one sitting increases the likelihood of GI side effects. Asma agrees to continue metformin for now and a prescription was written today for 1 month refill. Nyala agreed to follow up with [place] as directed to monitor her progress.  Diabetes risk counseling Lillianna was given extended (15 minutes) diabetes prevention counseling today. She is 51 y.o. female and has risk factors for diabetes including obesity and pre-diabetes. We discussed intensive lifestyle modifications today with an emphasis on weight loss as well as increasing exercise and decreasing simple carbohydrates in her diet.  Obesity Margeret is currently in the action stage of change. As such, her goal is to continue with weight loss efforts She has agreed to follow the Category 2 plan Adelle has been instructed to work up to a goal of 150 minutes of combined cardio and strengthening exercise per week for weight loss and overall health benefits. We discussed the following Behavioral Modification Strategies today: increasing lean protein intake and decreasing simple carbohydrates   Kayana has agreed to follow up with our clinic in 2 to 3 weeks. She was informed of the importance of frequent follow up visits to maximize her success with intensive lifestyle modifications for her multiple health conditions.  I, Doreene Nest, am  acting as transcriptionist for Dennard Nip, MD  I have reviewed the above documentation for accuracy and completeness, and I agree with the above. -Dennard Nip, MD   OBESITY BEHAVIORAL INTERVENTION VISIT  Today's visit was # 14 out of 22.  Starting weight: 216 lbs Starting date: 01/02/17 Today's  weight : 184 lbs Today's date: 08/09/2017 Total lbs lost to date: 26 (Patients must lose 7 lbs in the first 6 months to continue with counseling)   ASK: We discussed the diagnosis of obesity with Yevette Edwards today and Marinna agreed to give [place] permission to discuss obesity behavioral modification therapy today.  ASSESS: Hye has the diagnosis of obesity and her BMI today is 28.81 Rhyder is in the action stage of change   ADVISE: Denese was educated on the multiple health risks of obesity as well as the benefit of weight loss to improve her health. She was advised of the need for long term treatment and the importance of lifestyle modifications.  AGREE: Multiple dietary modification options and treatment options were discussed and  Jalynn agreed to follow the Category 2 plan We discussed the following Behavioral Modification Strategies today: increasing lean protein intake and decreasing simple carbohydrates

## 2017-08-10 LAB — CBC WITH DIFFERENTIAL
BASOS ABS: 0 10*3/uL (ref 0.0–0.2)
BASOS: 0 %
EOS (ABSOLUTE): 0.2 10*3/uL (ref 0.0–0.4)
Eos: 4 %
Hematocrit: 36.8 % (ref 34.0–46.6)
Hemoglobin: 11.5 g/dL (ref 11.1–15.9)
IMMATURE GRANS (ABS): 0 10*3/uL (ref 0.0–0.1)
Immature Granulocytes: 0 %
LYMPHS ABS: 1.8 10*3/uL (ref 0.7–3.1)
Lymphs: 30 %
MCH: 26.8 pg (ref 26.6–33.0)
MCHC: 31.3 g/dL — AB (ref 31.5–35.7)
MCV: 86 fL (ref 79–97)
MONOS ABS: 0.3 10*3/uL (ref 0.1–0.9)
Monocytes: 5 %
NEUTROS PCT: 61 %
Neutrophils Absolute: 3.5 10*3/uL (ref 1.4–7.0)
RBC: 4.29 x10E6/uL (ref 3.77–5.28)
RDW: 14.9 % (ref 12.3–15.4)
WBC: 5.9 10*3/uL (ref 3.4–10.8)

## 2017-08-10 LAB — VITAMIN D 25 HYDROXY (VIT D DEFICIENCY, FRACTURES): VIT D 25 HYDROXY: 43.2 ng/mL (ref 30.0–100.0)

## 2017-08-10 LAB — COMPREHENSIVE METABOLIC PANEL
A/G RATIO: 1.4 (ref 1.2–2.2)
ALBUMIN: 4.2 g/dL (ref 3.5–5.5)
ALT: 7 IU/L (ref 0–32)
AST: 11 IU/L (ref 0–40)
Alkaline Phosphatase: 71 IU/L (ref 39–117)
BUN / CREAT RATIO: 19 (ref 9–23)
BUN: 18 mg/dL (ref 6–24)
Bilirubin Total: 0.2 mg/dL (ref 0.0–1.2)
CALCIUM: 9.3 mg/dL (ref 8.7–10.2)
CO2: 25 mmol/L (ref 20–29)
CREATININE: 0.96 mg/dL (ref 0.57–1.00)
Chloride: 104 mmol/L (ref 96–106)
GFR, EST AFRICAN AMERICAN: 79 mL/min/{1.73_m2} (ref >59)
GFR, EST NON AFRICAN AMERICAN: 68 mL/min/{1.73_m2} (ref >59)
GLOBULIN, TOTAL: 2.9 g/dL (ref 1.5–4.5)
Glucose: 92 mg/dL (ref 65–99)
POTASSIUM: 5.2 mmol/L (ref 3.5–5.2)
SODIUM: 142 mmol/L (ref 134–144)
TOTAL PROTEIN: 7.1 g/dL (ref 6.0–8.5)

## 2017-08-10 LAB — LIPID PANEL WITH LDL/HDL RATIO
Cholesterol, Total: 175 mg/dL (ref 100–199)
HDL: 55 mg/dL (ref >39)
LDL Calculated: 96 mg/dL (ref 0–99)
LDL/HDL RATIO: 1.7 ratio (ref 0.0–3.2)
Triglycerides: 118 mg/dL (ref 0–149)
VLDL Cholesterol Cal: 24 mg/dL (ref 5–40)

## 2017-08-10 LAB — HEMOGLOBIN A1C
ESTIMATED AVERAGE GLUCOSE: 114 mg/dL
HEMOGLOBIN A1C: 5.6 % (ref 4.8–5.6)

## 2017-08-10 LAB — VITAMIN B12: VITAMIN B 12: 1315 pg/mL — AB (ref 232–1245)

## 2017-08-10 LAB — INSULIN, RANDOM: INSULIN: 14.4 u[IU]/mL (ref 2.6–24.9)

## 2017-08-21 MED FILL — MINIVELLE 0.075 MG PATCH: 0.075 | 28 days supply | Qty: 8 | Fill #2

## 2017-08-23 ENCOUNTER — Ambulatory Visit (INDEPENDENT_AMBULATORY_CARE_PROVIDER_SITE_OTHER): Payer: 59 | Admitting: Physician Assistant

## 2017-08-23 VITALS — BP 107/74 | HR 74 | Temp 97.8°F | Ht 67.0 in | Wt 185.0 lb

## 2017-08-23 DIAGNOSIS — E669 Obesity, unspecified: Secondary | ICD-10-CM | POA: Diagnosis not present

## 2017-08-23 DIAGNOSIS — R7303 Prediabetes: Secondary | ICD-10-CM

## 2017-08-23 DIAGNOSIS — Z6830 Body mass index (BMI) 30.0-30.9, adult: Secondary | ICD-10-CM | POA: Diagnosis not present

## 2017-08-23 NOTE — Progress Notes (Signed)
Office: [phone]  /  Fax: [phone]   HPI:   Chief Complaint: OBESITY Whitney Davenport is here to discuss her progress with her obesity treatment plan. She is on the Category 2 plan and is following her eating plan approximately 60 % of the time. She states she is walking for 20 minutes 2 times per week. Whitney Davenport was on vacation but was mindful of her eating. She controlled her portions and  made smarter food choices. Whitney Davenport would like more convenience food options and meal planning ideas. Her weight is 185 lb (83.9 kg) today and has had a weight gain of 1 pound over a period of 2 weeks since her last visit. She has lost 31 lbs since starting treatment with [place].  Pre-Diabetes Whitney Davenport has a diagnosis of pre-diabetes based on her elevated Hgb A1c and was informed this puts her at greater risk of developing diabetes. She is taking metformin currently and continues to work on diet and exercise to decrease risk of diabetes. She denies nausea, polyphagia or hypoglycemia.  ALLERGIES: Allergies  Allergen Reactions  . Sulfa Antibiotics Nausea And Vomiting and Other (See Comments)    Causes headache with nausea  . Hydrocodone Itching and Rash  . Zithromax [Azithromycin] Itching and Rash    MEDICATIONS: Current Outpatient Medications on File Prior to Visit  Medication Sig Dispense Refill  . cetirizine (ZYRTEC) 10 MG tablet Take 10 mg by mouth at bedtime.     . clonazePAM (KLONOPIN) 0.5 MG tablet Take 0.5 mg by mouth 2 (two) times daily as needed for anxiety.     Marland Kitchen estradiol (VIVELLE-DOT) 0.075 MG/24HR Place 1 patch onto the skin 2 (two) times a week.    . metFORMIN (GLUCOPHAGE) 500 MG tablet Take 1 tablet (500 mg total) by mouth every morning. 30 tablet 0  . progesterone (PROMETRIUM) 100 MG capsule Take 100 mg by mouth at bedtime.    . sertraline (ZOLOFT) 100 MG tablet Take 100 mg by mouth at bedtime.     . simvastatin (ZOCOR) 40 MG tablet Take 40 mg by mouth at bedtime.     . vitamin  B-12 (CYANOCOBALAMIN) 1000 MCG tablet Take 1 tablet (1,000 mcg total) by mouth daily. 30 tablet 0  . Vitamin D, Ergocalciferol, (DRISDOL) 50000 units CAPS capsule Take 1 capsule (50,000 Units total) by mouth every 14 (fourteen) days. 2 capsule 0   No current facility-administered medications on file prior to visit.     PAST MEDICAL HISTORY: Past Medical History:  Diagnosis Date  . Allergy   . Anemia    hx of  . Anxiety   . Back injury    "ruptured disc-no problems since surgery"  . Back pain   . Constipation   . Depression   . GERD (gastroesophageal reflux disease)    occ  . Hepatitis 2004   hx of hep c-"interferon treatment, does not show up on blood test anymore"  . History of kidney stones   . Hyperlipidemia   . Hypotension    no fainting in years  . Infertility, female   . Lumbar disc disease   . Osteoarthritis   . Vitamin D deficiency     PAST SURGICAL HISTORY: Past Surgical History:  Procedure Laterality Date  . DIAGNOSTIC LAPAROSCOPY  last 2001   for endometriosis, couple of surgeries  . GANGLION CYST EXCISION  age 66    SOCIAL HISTORY: Social History   Tobacco Use  . Smoking status: Former Smoker    Years: 15.00  Types: Cigarettes    Last attempt to quit: 10/31/2010    Years since quitting: 6.8  . Smokeless tobacco: Never Used  Substance Use Topics  . Alcohol use: Yes    Comment: rare  . Drug use: No    FAMILY HISTORY: Family History  Problem Relation Age of Onset  . Thyroid disease Mother   . Diabetes Father   . Hyperlipidemia Father   . Obesity Father   . Cancer Maternal Grandmother   . Colon cancer Neg Hx     ROS: Review of Systems  Constitutional: Negative for weight loss.  Gastrointestinal: Negative for nausea.  Endo/Heme/Allergies:       Negative polyphagia Negative hypoglycemia    PHYSICAL EXAM: Blood pressure 107/74, pulse 74, temperature 97.8 F (36.6 C), temperature source Oral, height 5\' 7"  (1.702 m), weight 185 lb  (83.9 kg), SpO2 100 %. Body mass index is 28.98 kg/m. Physical Exam  Constitutional: She is oriented to person, place, and time. She appears well-developed and well-nourished.  Cardiovascular: Normal rate.  Pulmonary/Chest: Effort normal.  Musculoskeletal: Normal range of motion.  Neurological: She is oriented to person, place, and time.  Skin: Skin is warm and dry.  Psychiatric: She has a normal mood and affect. Her behavior is normal.  Vitals reviewed.   RECENT LABS AND TESTS: BMET    Component Value Date/Time   NA 142 08/09/2017 0814   K 5.2 08/09/2017 0814   CL 104 08/09/2017 0814   CO2 25 08/09/2017 0814   GLUCOSE 92 08/09/2017 0814   GLUCOSE 110 (H) 02/04/2015 0519   BUN 18 08/09/2017 0814   CREATININE 0.96 08/09/2017 0814   CALCIUM 9.3 08/09/2017 0814   GFRNONAA 68 08/09/2017 0814   GFRAA 79 08/09/2017 0814   Lab Results  Component Value Date   HGBA1C 5.6 08/09/2017   HGBA1C 5.6 05/08/2017   HGBA1C 5.9 (H) 01/02/2017   Lab Results  Component Value Date   INSULIN 14.4 08/09/2017   INSULIN 20.1 05/08/2017   INSULIN 25.0 (H) 01/02/2017   CBC    Component Value Date/Time   WBC 5.9 08/09/2017 0814   WBC 13.1 (H) 02/04/2015 0519   RBC 4.29 08/09/2017 0814   RBC 3.71 (L) 02/04/2015 0519   HGB 11.5 08/09/2017 0814   HCT 36.8 08/09/2017 0814   PLT 226 02/04/2015 0519   MCV 86 08/09/2017 0814   MCH 26.8 08/09/2017 0814   MCH 27.5 02/04/2015 0519   MCHC 31.3 (L) 08/09/2017 0814   MCHC 30.6 02/04/2015 0519   RDW 14.9 08/09/2017 0814   LYMPHSABS 1.8 08/09/2017 0814   EOSABS 0.2 08/09/2017 0814   BASOSABS 0.0 08/09/2017 0814   Iron/TIBC/Ferritin/ %Sat No results found for: IRON, TIBC, FERRITIN, IRONPCTSAT Lipid Panel     Component Value Date/Time   CHOL 175 08/09/2017 0814   TRIG 118 08/09/2017 0814   HDL 55 08/09/2017 0814   LDLCALC 96 08/09/2017 0814   Hepatic Function Panel     Component Value Date/Time   PROT 7.1 08/09/2017 0814   ALBUMIN 4.2  08/09/2017 0814   AST 11 08/09/2017 0814   ALT 7 08/09/2017 0814   ALKPHOS 71 08/09/2017 0814   BILITOT 0.2 08/09/2017 0814      Component Value Date/Time   TSH 4.280 01/02/2017 1007    ASSESSMENT AND PLAN: Prediabetes  Class 1 obesity with serious comorbidity and body mass index (BMI) of 30.0 to 30.9 in adult, unspecified obesity type - Starting BMI greater then 30  PLAN:  Pre-Diabetes Whitney Davenport will continue to work on weight loss, exercise, and decreasing simple carbohydrates in her diet to help decrease the risk of diabetes. We dicussed metformin including benefits and risks. She was informed that eating too many simple carbohydrates or too many calories at one sitting increases the likelihood of GI side effects. Eathel agrees to continue metformin for now and a prescription was not written today. Whitney Davenport agreed to follow up with [place] as directed to monitor her progress.  We spent > than 50% of the 15 minute visit on the counseling as documented in the note.  Obesity Whitney Davenport is currently in the action stage of change. As such, her goal is to continue with weight loss efforts She has agreed to keep a food journal with 1200 calories and 75 grams of protein daily Whitney Davenport has been instructed to work up to a goal of 150 minutes of combined cardio and strengthening exercise per week for weight loss and overall health benefits. We discussed the following Behavioral Modification Strategies today: increasing lean protein intake and work on meal planning and easy cooking plans  Whitney Davenport has agreed to follow up with our clinic in 3 weeks. She was informed of the importance of frequent follow up visits to maximize her success with intensive lifestyle modifications for her multiple health conditions.  Whitney Davenport, Doreene Nest, am acting as transcriptionist for Lacy Duverney, PA-C  Whitney Davenport have reviewed the above documentation for accuracy and completeness, and Whitney Davenport agree with the above. -Lacy Duverney,  PA-C  Whitney Davenport have reviewed the above note and agree with the plan. -Dennard Nip, MD  OBESITY BEHAVIORAL INTERVENTION VISIT  Today's visit was # 15 out of 108.  Starting weight: 216 lbs Starting date: 01/02/17 Today's weight : 185 lbs  Today's date: 08/23/2017 Total lbs lost to date: 24 (Patients must lose 7 lbs in the first 6 months to continue with counseling)   ASK: We discussed the diagnosis of obesity with Yevette Edwards today and Latese agreed to give [place] permission to discuss obesity behavioral modification therapy today.  ASSESS: Adeana has the diagnosis of obesity and her BMI today is 28.97 Kelaiah is in the action stage of change   ADVISE: Danahi was educated on the multiple health risks of obesity as well as the benefit of weight loss to improve her health. She was advised of the need for long term treatment and the importance of lifestyle modifications.  AGREE: Multiple dietary modification options and treatment options were discussed and  Jemina agreed to keep a food journal with 1200 calories and 75 grams of protein daily We discussed the following Behavioral Modification Strategies today: increasing lean protein intake and work on meal planning and easy cooking plans

## 2017-08-31 DIAGNOSIS — H5213 Myopia, bilateral: Secondary | ICD-10-CM | POA: Diagnosis not present

## 2017-08-31 DIAGNOSIS — H52223 Regular astigmatism, bilateral: Secondary | ICD-10-CM | POA: Diagnosis not present

## 2017-08-31 DIAGNOSIS — H524 Presbyopia: Secondary | ICD-10-CM | POA: Diagnosis not present

## 2017-08-31 MED FILL — SERTRALINE HCL 100 MG TAB: 100 | 90 days supply | Qty: 90 | Fill #0

## 2017-08-31 MED FILL — metFORMIN HCL 500 MG TABS: 500 | 30 days supply | Qty: 30 | Fill #0

## 2017-08-31 MED FILL — PROGESTERONE 100 MG CAPSULE: 100 | 30 days supply | Qty: 30 | Fill #2

## 2017-08-31 MED FILL — VIT D2 1.25 MG (50,000 UNIT: 1.25 MG | 28 days supply | Qty: 4 | Fill #2

## 2017-09-04 ENCOUNTER — Ambulatory Visit (INDEPENDENT_AMBULATORY_CARE_PROVIDER_SITE_OTHER): Payer: 59 | Admitting: Physician Assistant

## 2017-09-11 MED FILL — SIMVASTATIN 40 MG TABLET: 40 | 90 days supply | Qty: 90 | Fill #1

## 2017-09-12 ENCOUNTER — Ambulatory Visit (INDEPENDENT_AMBULATORY_CARE_PROVIDER_SITE_OTHER): Payer: 59 | Admitting: Physician Assistant

## 2017-09-12 VITALS — BP 114/82 | HR 73 | Temp 98.6°F | Ht 67.0 in | Wt 187.0 lb

## 2017-09-12 DIAGNOSIS — Z6830 Body mass index (BMI) 30.0-30.9, adult: Secondary | ICD-10-CM

## 2017-09-12 DIAGNOSIS — E559 Vitamin D deficiency, unspecified: Secondary | ICD-10-CM | POA: Diagnosis not present

## 2017-09-12 DIAGNOSIS — E669 Obesity, unspecified: Secondary | ICD-10-CM | POA: Diagnosis not present

## 2017-09-12 NOTE — Progress Notes (Signed)
Office: [phone]  /  Fax: [phone]   HPI:   Chief Complaint: OBESITY Whitney Davenport is here to discuss her progress with her obesity treatment plan. She is on the keep a food journal with 1200 calories and 75 grams of protein daily and is following her eating plan approximately 50 % of the time. She states she is exercising 0 minutes 0 times per week. Whitney Davenport had increase celebration eating. She continues to be mindful of her eating. She would like more meal planning ideas.  Her weight is 187 lb (84.8 kg) today and has gained 2 pounds since her last visit. She has lost 29 lbs since starting treatment with [place].  Vitamin D deficiency Whitney Davenport has a diagnosis of vitamin D deficiency. She is currently taking prescription Vit D and denies nausea, vomiting or muscle weakness.  ALLERGIES: Allergies  Allergen Reactions  . Sulfa Antibiotics Nausea And Vomiting and Other (See Comments)    Causes headache with nausea  . Hydrocodone Itching and Rash  . Zithromax [Azithromycin] Itching and Rash    MEDICATIONS: Current Outpatient Medications on File Prior to Visit  Medication Sig Dispense Refill  . cetirizine (ZYRTEC) 10 MG tablet Take 10 mg by mouth at bedtime.     . clonazePAM (KLONOPIN) 0.5 MG tablet Take 0.5 mg by mouth 2 (two) times daily as needed for anxiety.     Marland Kitchen estradiol (VIVELLE-DOT) 0.075 MG/24HR Place 1 patch onto the skin 2 (two) times a week.    . metFORMIN (GLUCOPHAGE) 500 MG tablet Take 1 tablet (500 mg total) by mouth every morning. 30 tablet 0  . progesterone (PROMETRIUM) 100 MG capsule Take 100 mg by mouth at bedtime.    . sertraline (ZOLOFT) 100 MG tablet Take 100 mg by mouth at bedtime.     . simvastatin (ZOCOR) 40 MG tablet Take 40 mg by mouth at bedtime.     . vitamin B-12 (CYANOCOBALAMIN) 1000 MCG tablet Take 1 tablet (1,000 mcg total) by mouth daily. 30 tablet 0  . Vitamin D, Ergocalciferol, (DRISDOL) 50000 units CAPS capsule Take 1 capsule (50,000 Units total)  by mouth every 14 (fourteen) days. 2 capsule 0   No current facility-administered medications on file prior to visit.     PAST MEDICAL HISTORY: Past Medical History:  Diagnosis Date  . Allergy   . Anemia    hx of  . Anxiety   . Back injury    "ruptured disc-no problems since surgery"  . Back pain   . Constipation   . Depression   . GERD (gastroesophageal reflux disease)    occ  . Hepatitis 2004   hx of hep c-"interferon treatment, does not show up on blood test anymore"  . History of kidney stones   . Hyperlipidemia   . Hypotension    no fainting in years  . Infertility, female   . Lumbar disc disease   . Osteoarthritis   . Vitamin D deficiency     PAST SURGICAL HISTORY: Past Surgical History:  Procedure Laterality Date  . DIAGNOSTIC LAPAROSCOPY  last 2001   for endometriosis, couple of surgeries  . GANGLION CYST EXCISION  age 13  . LUMBAR LAMINECTOMY/DECOMPRESSION MICRODISCECTOMY  11/03/2011   Procedure: LUMBAR LAMINECTOMY/DECOMPRESSION MICRODISCECTOMY;  Surgeon: Hosie Spangle, MD;  Location: [facility];  Service: Neurosurgery;  Laterality: Left;  LEFT Lumbar five sacral one laminotomy and microdiskectomy  . TOTAL HIP ARTHROPLASTY Right 02/03/2015   Procedure: RIGHT TOTAL HIP ARTHROPLASTY ANTERIOR APPROACH;  Surgeon: Rodman Key  Alvan Dame, MD;  Location: [facility];  Service: Orthopedics;  Laterality: Right;    SOCIAL HISTORY: Social History   Tobacco Use  . Smoking status: Former Smoker    Years: 15.00    Types: Cigarettes    Last attempt to quit: 10/31/2010    Years since quitting: 6.8  . Smokeless tobacco: Never Used  Substance Use Topics  . Alcohol use: Yes    Comment: rare  . Drug use: No    FAMILY HISTORY: Family History  Problem Relation Age of Onset  . Thyroid disease Mother   . Diabetes Father   . Hyperlipidemia Father   . Obesity Father   . Cancer Maternal Grandmother   . Colon cancer Neg Hx     ROS: Review of Systems  Constitutional: Negative  for weight loss.  Gastrointestinal: Negative for nausea and vomiting.  Musculoskeletal:       Negative muscle weakness    PHYSICAL EXAM: Blood pressure 114/82, pulse 73, temperature 98.6 F (37 C), temperature source Oral, height 5\' 7"  (1.702 m), weight 187 lb (84.8 kg), SpO2 99 %. Body mass index is 29.29 kg/m. Physical Exam  Constitutional: She is oriented to person, place, and time. She appears well-developed and well-nourished.  Cardiovascular: Normal rate.  Pulmonary/Chest: Effort normal.  Musculoskeletal: Normal range of motion.  Neurological: She is oriented to person, place, and time.  Skin: Skin is warm and dry.  Psychiatric: She has a normal mood and affect. Her behavior is normal.  Vitals reviewed.   RECENT LABS AND TESTS: BMET    Component Value Date/Time   NA 142 08/09/2017 0814   K 5.2 08/09/2017 0814   CL 104 08/09/2017 0814   CO2 25 08/09/2017 0814   GLUCOSE 92 08/09/2017 0814   GLUCOSE 110 (H) 02/04/2015 0519   BUN 18 08/09/2017 0814   CREATININE 0.96 08/09/2017 0814   CALCIUM 9.3 08/09/2017 0814   GFRNONAA 68 08/09/2017 0814   GFRAA 79 08/09/2017 0814   Lab Results  Component Value Date   HGBA1C 5.6 08/09/2017   HGBA1C 5.6 05/08/2017   HGBA1C 5.9 (H) 01/02/2017   Lab Results  Component Value Date   INSULIN 14.4 08/09/2017   INSULIN 20.1 05/08/2017   INSULIN 25.0 (H) 01/02/2017   CBC    Component Value Date/Time   WBC 5.9 08/09/2017 0814   WBC 13.1 (H) 02/04/2015 0519   RBC 4.29 08/09/2017 0814   RBC 3.71 (L) 02/04/2015 0519   HGB 11.5 08/09/2017 0814   HCT 36.8 08/09/2017 0814   PLT 226 02/04/2015 0519   MCV 86 08/09/2017 0814   MCH 26.8 08/09/2017 0814   MCH 27.5 02/04/2015 0519   MCHC 31.3 (L) 08/09/2017 0814   MCHC 30.6 02/04/2015 0519   RDW 14.9 08/09/2017 0814   LYMPHSABS 1.8 08/09/2017 0814   EOSABS 0.2 08/09/2017 0814   BASOSABS 0.0 08/09/2017 0814   Iron/TIBC/Ferritin/ %Sat No results found for: IRON, TIBC, FERRITIN,  IRONPCTSAT Lipid Panel     Component Value Date/Time   CHOL 175 08/09/2017 0814   TRIG 118 08/09/2017 0814   HDL 55 08/09/2017 0814   LDLCALC 96 08/09/2017 0814   Hepatic Function Panel     Component Value Date/Time   PROT 7.1 08/09/2017 0814   ALBUMIN 4.2 08/09/2017 0814   AST 11 08/09/2017 0814   ALT 7 08/09/2017 0814   ALKPHOS 71 08/09/2017 0814   BILITOT 0.2 08/09/2017 0814      Component Value Date/Time   TSH  4.280 01/02/2017 1007    ASSESSMENT AND PLAN: Vitamin D deficiency  Class 1 obesity with serious comorbidity and body mass index (BMI) of 30.0 to 30.9 in adult, unspecified obesity type - Starting BMI greater then 30  PLAN:  Vitamin D Deficiency Whitney Davenport was informed that low vitamin D levels contributes to fatigue and are associated with obesity, breast, and colon cancer. Whitney Davenport agrees to continue taking prescription Vit D @50 ,000 IU every 14 days #2 and will follow up for routine testing of vitamin D, at least 2-3 times per year. She was informed of the risk of over-replacement of vitamin D and agrees to not increase her dose unless he discusses this with [place] first. Whitney Davenport agrees to follow up with our clinic in 2 weeks.  We spent > than 50% of the 15 minute visit on the counseling as documented in the note.  Obesity Whitney Davenport is currently in the action stage of change. As such, her goal is to continue with weight loss efforts She has agreed to keep a food journal with 1200 calories and 75 grams of protein daily Whitney Davenport has been instructed to work up to a goal of 150 minutes of combined cardio and strengthening exercise per week for weight loss and overall health benefits. We discussed the following Behavioral Modification Strategies today: increasing lean protein intake and work on meal planning and easy cooking plans   Whitney Davenport has agreed to follow up with our clinic in 2 weeks. She was informed of the importance of frequent follow up visits to  maximize her success with intensive lifestyle modifications for her multiple health conditions.  I, Trixie Dredge, am acting as transcriptionist for Lacy Duverney, PA-C  I have reviewed the above documentation for accuracy and completeness, and I agree with the above. -Lacy Duverney, PA-C  I have reviewed the above note and agree with the plan. -Dennard Nip, MD     Today's visit was # 16 out of 22.  Starting weight: 216 lbs Starting date: 01/02/17 Today's weight : 187 lbs  Today's date: 09/12/2017 Total lbs lost to date: 29 (Patients must lose 7 lbs in the first 6 months to continue with counseling)   ASK: We discussed the diagnosis of obesity with Whitney Davenport today and Whitney Davenport agreed to give [place] permission to discuss obesity behavioral modification therapy today.  ASSESS: Whitney Davenport has the diagnosis of obesity and her BMI today is 29.28 Whitney Davenport is in the action stage of change   ADVISE: Whitney Davenport was educated on the multiple health risks of obesity as well as the benefit of weight loss to improve her health. She was advised of the need for long term treatment and the importance of lifestyle modifications.  AGREE: Multiple dietary modification options and treatment options were discussed and  Whitney Davenport agreed to keep a food journal with 1200 calories and 75 grams of protein daily We discussed the following Behavioral Modification Strategies today: increasing lean protein intake and work on meal planning and easy cooking plans

## 2017-09-14 ENCOUNTER — Emergency Department (HOSPITAL_COMMUNITY)
Admission: EM | Admit: 2017-09-14 | Discharge: 2017-09-14 | Disposition: A | Discharge status: Home / Self Care | Payer: 59 | Attending: Emergency Medicine | Admitting: Emergency Medicine

## 2017-09-14 ENCOUNTER — Emergency Department (HOSPITAL_COMMUNITY): Payer: 59

## 2017-09-14 DIAGNOSIS — R0789 Other chest pain: Secondary | ICD-10-CM | POA: Insufficient documentation

## 2017-09-14 DIAGNOSIS — Z79899 Other long term (current) drug therapy: Secondary | ICD-10-CM | POA: Diagnosis not present

## 2017-09-14 DIAGNOSIS — Z87891 Personal history of nicotine dependence: Secondary | ICD-10-CM | POA: Insufficient documentation

## 2017-09-14 LAB — BASIC METABOLIC PANEL
ANION GAP: 6 (ref 5–15)
BUN: 17 mg/dL (ref 6–20)
CO2: 25 mmol/L (ref 22–32)
Calcium: 9 mg/dL (ref 8.9–10.3)
Chloride: 105 mmol/L (ref 101–111)
Creatinine, Ser: 0.91 mg/dL (ref 0.44–1.00)
Glucose, Bld: 103 mg/dL — ABNORMAL HIGH (ref 65–99)
Potassium: 4.3 mmol/L (ref 3.5–5.1)
Sodium: 136 mmol/L (ref 135–145)

## 2017-09-14 LAB — CBC
HCT: 37.2 % (ref 36.0–46.0)
Hemoglobin: 11.7 g/dL — ABNORMAL LOW (ref 12.0–15.0)
MCH: 27.5 pg (ref 26.0–34.0)
MCHC: 31.5 g/dL (ref 30.0–36.0)
MCV: 87.3 fL (ref 78.0–100.0)
PLATELETS: 198 10*3/uL (ref 150–400)
RBC: 4.26 MIL/uL (ref 3.87–5.11)
RDW: 15 % (ref 11.5–15.5)
WBC: 6.1 10*3/uL (ref 4.0–10.5)

## 2017-09-14 LAB — I-STAT TROPONIN, ED
Troponin i, poc: 0 ng/mL (ref 0.00–0.08)
Troponin i, poc: 0 ng/mL (ref 0.00–0.08)

## 2017-09-14 LAB — I-STAT BETA HCG BLOOD, ED (MC, WL, AP ONLY)

## 2017-09-14 LAB — D-DIMER, QUANTITATIVE (NOT AT ARMC)

## 2017-09-14 NOTE — Discharge Instructions (Signed)
Your work up today was normal  Given your risk factors I suggest you obtain follow up with cardiology if you continue to have chest tightness. There may be some anxiety component to your symptoms.  Return for exertional chest pain, shortness of breath, palpitations, light-headedness, passing out

## 2017-09-14 NOTE — ED Provider Notes (Signed)
[facility] DEPT Provider Note   CSN: [id number] Arrival date & time: 09/14/17  0254     History   Chief Complaint No chief complaint on file.   HPI Whitney Davenport is a 51 y.o. female with history of prediabetes, hyperlipidemia, obesity, anxiety presents to the ED for evaluation of intermittent, nonradiating central chest tightness described as "discomfort" times several days. Chest tightness is non-positional, nonexertional and nonpleuritic, nonreproducible with movements. States she has history of anxiety and has noticed anxiety has been getting worse recently, states that she occasionally has shortness of breath when she has panic attacks described as "having to take a deep breath" which makes her chest felt "uncomfortable". Patient thinks that symptoms today may be from her anxiety but presented today because they have been more persistent and occurring more frequently. No alleviating factors. Aggravating factors. Has not tried anything for chest discomfort/tightness PTA.  No known history of CAD. No history of hypertension, tobacco abuse, family history of young CAD, PE/DVT. She is on an estrogen patch. Denies fevers, cough, recent URI, nausea, vomiting, palpitations, dizziness, abdominal pain.  HPI  Past Medical History:  Diagnosis Date  . Allergy   . Anemia    hx of  . Anxiety   . Back injury    "ruptured disc-no problems since surgery"  . Back pain   . Constipation   . Depression   . GERD (gastroesophageal reflux disease)    occ  . Hepatitis 2004   hx of hep c-"interferon treatment, does not show up on blood test anymore"  . History of kidney stones   . Hyperlipidemia   . Hypotension    no fainting in years  . Infertility, female   . Lumbar disc disease   . Osteoarthritis   . Vitamin D deficiency     Patient Active Problem List   Diagnosis Date Noted  . Class 1 obesity without serious comorbidity with body mass index (BMI) of  30.0 to 30.9 in adult 03/15/2017  . Class 1 obesity without serious comorbidity with body mass index (BMI) of 32.0 to 32.9 in adult 01/17/2017  . Prediabetes 01/17/2017  . Shortness of breath on exertion 01/02/2017  . Hyperlipidemia 01/02/2017  . Vitamin D deficiency 01/02/2017  . Obese 02/04/2015  . S/P right THA, AA 02/03/2015    Past Surgical History:  Procedure Laterality Date  . DIAGNOSTIC LAPAROSCOPY  last 2001   for endometriosis, couple of surgeries  . GANGLION CYST EXCISION  age 18  . LUMBAR LAMINECTOMY/DECOMPRESSION MICRODISCECTOMY  11/03/2011   Procedure: LUMBAR LAMINECTOMY/DECOMPRESSION MICRODISCECTOMY;  Surgeon: Hosie Spangle, MD;  Location: [facility];  Service: Neurosurgery;  Laterality: Left;  LEFT Lumbar five sacral one laminotomy and microdiskectomy  . TOTAL HIP ARTHROPLASTY Right 02/03/2015   Procedure: RIGHT TOTAL HIP ARTHROPLASTY ANTERIOR APPROACH;  Surgeon: Paralee Cancel, MD;  Location: [facility];  Service: Orthopedics;  Laterality: Right;    OB History    Gravida Para Term Preterm AB Living   0 0 0 0 0 0   SAB TAB Ectopic Multiple Live Births   0 0 0 0 0       Home Medications    Prior to Admission medications   Medication Sig Start Date End Date Taking? Authorizing Provider  cetirizine (ZYRTEC) 10 MG tablet Take 10 mg by mouth at bedtime.    Yes [provider]  clonazePAM (KLONOPIN) 0.5 MG tablet Take 0.5 mg by mouth 2 (two) times daily as needed  for anxiety.    Yes [provider]  estradiol (VIVELLE-DOT) 0.075 MG/24HR Place 1 patch onto the skin 2 (two) times a week.   Yes [provider]  metFORMIN (GLUCOPHAGE) 500 MG tablet Take 1 tablet (500 mg total) by mouth every morning. 08/09/17  Yes Beasley, Caren D, MD  progesterone (PROMETRIUM) 100 MG capsule Take 100 mg by mouth at bedtime.   Yes [provider]  sertraline (ZOLOFT) 100 MG tablet Take 100 mg by mouth at bedtime.    Yes [provider]    simvastatin (ZOCOR) 40 MG tablet Take 40 mg by mouth at bedtime.    Yes [provider]  vitamin B-12 (CYANOCOBALAMIN) 1000 MCG tablet Take 1 tablet (1,000 mcg total) by mouth daily. Patient taking differently: Take 1,000 mcg by mouth every other day.  01/17/17  Yes Beasley, Caren D, MD  Vitamin D, Ergocalciferol, (DRISDOL) 50000 units CAPS capsule Take 1 capsule (50,000 Units total) by mouth every 14 (fourteen) days. 08/09/17 12/06/17 Yes Starlyn Skeans, MD    Family History Family History  Problem Relation Age of Onset  . Thyroid disease Mother   . Diabetes Father   . Hyperlipidemia Father   . Obesity Father   . Cancer Maternal Grandmother   . Colon cancer Neg Hx     Social History Social History   Tobacco Use  . Smoking status: Former Smoker    Years: 15.00    Types: Cigarettes    Last attempt to quit: 10/31/2010    Years since quitting: 6.8  . Smokeless tobacco: Never Used  Substance Use Topics  . Alcohol use: Yes    Comment: rare  . Drug use: No     Allergies   Sulfa antibiotics; Hydrocodone; and Zithromax [azithromycin]   Review of Systems Review of Systems  Cardiovascular: Positive for chest pain (tightness).     Physical Exam Updated Vital Signs BP 109/64   Pulse 64   Temp 97.7 F (36.5 C) (Oral)   Resp 14   LMP 09/07/2017   SpO2 100%   Physical Exam  Constitutional: She is oriented to person, place, and time. She appears well-developed and well-nourished. No distress.  NAD.  HENT:  Head: Normocephalic and atraumatic.  Right Ear: External ear normal.  Left Ear: External ear normal.  Nose: Nose normal.  Eyes: Conjunctivae and EOM are normal. No scleral icterus.  Neck: Normal range of motion. Neck supple.  Cardiovascular: Normal rate, regular rhythm and normal heart sounds.  No murmur heard. 2+ DP and radial pulses bilaterally No lower extremity edema  Pulmonary/Chest: Effort normal and breath sounds normal. She has no wheezes.  No  chest wall tenderness.  Abdominal: Soft. Bowel sounds are normal. There is no tenderness.  Musculoskeletal: Normal range of motion. She exhibits no deformity.  Neurological: She is alert and oriented to person, place, and time.  Skin: Skin is warm and dry. Capillary refill takes less than 2 seconds.  Psychiatric: She has a normal mood and affect. Her behavior is normal. Judgment and thought content normal.  Nursing note and vitals reviewed.    ED Treatments / Results  Labs (all labs ordered are listed, but only abnormal results are displayed) Labs Reviewed  BASIC METABOLIC PANEL - Abnormal; Notable for the following components:      Result Value   Glucose, Bld 103 (*)    All other components within normal limits  CBC - Abnormal; Notable for the following components:   Hemoglobin 11.7 (*)  All other components within normal limits  D-DIMER, QUANTITATIVE (NOT AT [facility])  I-STAT TROPONIN, ED  I-STAT BETA HCG BLOOD, ED ([facility], [facility], [facility] ONLY)  I-STAT TROPONIN, ED    EKG  EKG Interpretation  Date/Time:  Thursday September 14 2017 09:32:02 EST Ventricular Rate:  83 PR Interval:    QRS Duration: 85 QT Interval:  390 QTC Calculation: 459 R Axis:   80 Text Interpretation:  Sinus rhythm Confirmed by Daleen Bo [phone]) on 09/14/2017 12:20:04 PM       Radiology Dg Chest 2 View  Result Date: 09/14/2017 CLINICAL DATA:  Chest tightness and discomfort over last several days, history anxiety, GERD EXAM: CHEST  2 VIEW COMPARISON:  None FINDINGS: Normal heart size, mediastinal contours, and pulmonary vascularity. Lungs clear. No pleural effusion or pneumothorax. Bones unremarkable. IMPRESSION: Normal exam. Electronically Signed   By: Lavonia Dana M.D.   On: 09/14/2017 09:55    Procedures Procedures (including critical care time)  Medications Ordered in ED Medications - No data to display   Initial Impression / Assessment and Plan / ED Course  I have reviewed the triage vital signs  and the nursing notes.  Pertinent labs & imaging results that were available during my care of the patient were reviewed by me and considered in my medical decision making (see chart for details).    51 yo female presents with intermittent chest tightness, non exertional, non pleuritic w/o SOB, palpitations, dizziness. No known h/o CAD. Heart score 3.   Pertinent risk factors include hypercholesterolemia, DM, obesity.  On exam VS are wnl. Cardiovascular and pulmonary exam benign. CXR, EKG, troponin x 2 within normal limits.  CBC and BMP unremarkable.  D-dimer negative. Patient remained symptom free in ED. Patient has ambulated and tolerated PO in ED. Given symptoms, reassuring ED work up, low risk HEART score patient will be discharged with recommendation to follow up with PCP and cardiologist in regards to today's hospital visit. ED return preacutions given. Pt appears reliable for follow up and is agreeable to discharge.    Final Clinical Impressions(s) / ED Diagnoses   Final diagnoses:  Chest tightness    ED Discharge Orders    None       Kinnie Feil, PA-C 09/14/17 1417    Daleen Bo, MD 09/14/17 1538

## 2017-09-14 NOTE — ED Notes (Signed)
Bed: [facility] Expected date:  Expected time:  Means of arrival:  Comments: 

## 2017-09-14 NOTE — ED Triage Notes (Signed)
Pt reports of chest tightness/discomfort for several days w/o radiation. Pt reports hx of anxiety. Pt denies cardiac hx.  Pt A+OX4, speaking in complete sentences, non diaphoretic.

## 2017-09-20 MED FILL — ESTRADIOL 0.075 MG/24HR PTT: 0.075 | 28 days supply | Qty: 8 | Fill #0

## 2017-09-25 ENCOUNTER — Ambulatory Visit (INDEPENDENT_AMBULATORY_CARE_PROVIDER_SITE_OTHER): Payer: 59 | Admitting: Physician Assistant

## 2017-10-03 ENCOUNTER — Ambulatory Visit (INDEPENDENT_AMBULATORY_CARE_PROVIDER_SITE_OTHER): Payer: 59 | Admitting: Physician Assistant

## 2017-10-03 VITALS — BP 110/71 | HR 67 | Temp 97.7°F | Ht 67.0 in | Wt 189.0 lb

## 2017-10-03 DIAGNOSIS — R7303 Prediabetes: Secondary | ICD-10-CM | POA: Diagnosis not present

## 2017-10-03 DIAGNOSIS — Z9189 Other specified personal risk factors, not elsewhere classified: Secondary | ICD-10-CM

## 2017-10-03 DIAGNOSIS — E559 Vitamin D deficiency, unspecified: Secondary | ICD-10-CM

## 2017-10-03 DIAGNOSIS — Z6830 Body mass index (BMI) 30.0-30.9, adult: Secondary | ICD-10-CM

## 2017-10-03 DIAGNOSIS — E669 Obesity, unspecified: Secondary | ICD-10-CM | POA: Diagnosis not present

## 2017-10-03 MED ORDER — METFORMIN HCL 500 MG PO TABS: 500.0000 mg | ORAL_TABLET | Freq: Every morning | ORAL | 0 refills | Status: DC

## 2017-10-03 MED ORDER — VITAMIN D (ERGOCALCIFEROL) 1.25 MG (50000 UNIT) PO CAPS: 50000.0000 [IU] | ORAL_CAPSULE | ORAL | 0 refills | Status: DC

## 2017-10-03 MED FILL — PROGESTERONE 100 MG CAPSULE: 100 | 30 days supply | Qty: 30 | Fill #3

## 2017-10-03 MED FILL — VIT D2 1.25 MG (50,000 UNIT: 1.25 MG | 28 days supply | Qty: 4 | Fill #3

## 2017-10-03 NOTE — Progress Notes (Signed)
Office: [phone]  /  Fax: [phone]   HPI:   Chief Complaint: OBESITY Whitney Davenport is here to discuss her progress with her obesity treatment plan. She is on the keep a food journal with 1200 calories and 75 grams of protein daily and is following her eating plan approximately 30 % of the time. She states she is exercising 0 minutes 0 times per week. [name] had increase celebration eating and has not been as mindful. She is motivated to get back on track and continue with weight loss.  Her weight is 189 lb (85.7 kg) today and has gained 3 pounds since her last visit. She has lost 27 lbs since starting treatment with [place].  Vitamin D deficiency [name] has a diagnosis of vitamin D deficiency. She is currently taking prescription Vit D, but not yet at goal. She denies nausea, vomiting or muscle weakness.  Pre-Diabetes Zoei has a diagnosis of pre-diabetes based on her elevated Hgb A1c and was informed this puts her at greater risk of developing diabetes. She is taking metformin currently and continues to work on diet and exercise to decrease risk of diabetes. She denies polyphagia, nausea or hypoglycemia.  At risk for diabetes [name] is at higher than average risk for developing diabetes due to her obesity and pre-diabetes. She currently denies polyuria or polydipsia.  ALLERGIES: Allergies  Allergen Reactions  . Sulfa Antibiotics Nausea And Vomiting and Other (See Comments)    Causes headache with nausea  . Hydrocodone Itching and Rash  . Zithromax [Azithromycin] Itching and Rash    MEDICATIONS: Current Outpatient Medications on File Prior to Visit  Medication Sig Dispense Refill  . cetirizine (ZYRTEC) 10 MG tablet Take 10 mg by mouth at bedtime.     . clonazePAM (KLONOPIN) 0.5 MG tablet Take 0.5 mg by mouth 2 (two) times daily as needed for anxiety.     Marland Kitchen estradiol (VIVELLE-DOT) 0.075 MG/24HR Place 1 patch onto the skin 2 (two) times a week.    . metFORMIN  (GLUCOPHAGE) 500 MG tablet Take 1 tablet (500 mg total) by mouth every morning. 30 tablet 0  . progesterone (PROMETRIUM) 100 MG capsule Take 100 mg by mouth at bedtime.    . sertraline (ZOLOFT) 100 MG tablet Take 100 mg by mouth at bedtime.     . simvastatin (ZOCOR) 40 MG tablet Take 40 mg by mouth at bedtime.     . Vitamin D, Ergocalciferol, (DRISDOL) 50000 units CAPS capsule Take 1 capsule (50,000 Units total) by mouth every 14 (fourteen) days. 2 capsule 0   No current facility-administered medications on file prior to visit.     PAST MEDICAL HISTORY: Past Medical History:  Diagnosis Date  . Allergy   . Anemia    hx of  . Anxiety   . Back injury    "ruptured disc-no problems since surgery"  . Back pain   . Constipation   . Depression   . GERD (gastroesophageal reflux disease)    occ  . Hepatitis 2004   hx of hep c-"interferon treatment, does not show up on blood test anymore"  . History of kidney stones   . Hyperlipidemia   . Hypotension    no fainting in years  . Infertility, female   . Lumbar disc disease   . Osteoarthritis   . Vitamin D deficiency     PAST SURGICAL HISTORY: Past Surgical History:  Procedure Laterality Date  . DIAGNOSTIC LAPAROSCOPY  last 2001   for endometriosis, couple of surgeries  .  GANGLION CYST EXCISION  age 32  . LUMBAR LAMINECTOMY/DECOMPRESSION MICRODISCECTOMY  11/03/2011   Procedure: LUMBAR LAMINECTOMY/DECOMPRESSION MICRODISCECTOMY;  Surgeon: Hosie Spangle, MD;  Location: [facility];  Service: Neurosurgery;  Laterality: Left;  LEFT Lumbar five sacral one laminotomy and microdiskectomy  . TOTAL HIP ARTHROPLASTY Right 02/03/2015   Procedure: RIGHT TOTAL HIP ARTHROPLASTY ANTERIOR APPROACH;  Surgeon: Paralee Cancel, MD;  Location: [facility];  Service: Orthopedics;  Laterality: Right;    SOCIAL HISTORY: Social History   Tobacco Use  . Smoking status: Former Smoker    Years: 15.00    Types: Cigarettes    Last attempt to quit: 10/31/2010     Years since quitting: 6.9  . Smokeless tobacco: Never Used  Substance Use Topics  . Alcohol use: Yes    Comment: rare  . Drug use: No    FAMILY HISTORY: Family History  Problem Relation Age of Onset  . Thyroid disease Mother   . Diabetes Father   . Hyperlipidemia Father   . Obesity Father   . Cancer Maternal Grandmother   . Colon cancer Neg Hx     ROS: Review of Systems  Constitutional: Negative for weight loss.  Gastrointestinal: Negative for nausea and vomiting.  Genitourinary: Negative for frequency.  Musculoskeletal:       Negative muscle weakness  Endo/Heme/Allergies: Negative for polydipsia.       Negative polyphagia Negative hypoglycemia    PHYSICAL EXAM: Blood pressure 110/71, pulse 67, temperature 97.7 F (36.5 C), temperature source Oral, height 5\' 7"  (1.702 m), weight 189 lb (85.7 kg), last menstrual period 09/07/2017, SpO2 100 %. Body mass index is 29.6 kg/m. Physical Exam  Constitutional: She is oriented to person, place, and time. She appears well-developed and well-nourished.  Cardiovascular: Normal rate.  Pulmonary/Chest: Effort normal.  Musculoskeletal: Normal range of motion.  Neurological: She is oriented to person, place, and time.  Skin: Skin is warm and dry.  Psychiatric: She has a normal mood and affect. Her behavior is normal.  Vitals reviewed.   RECENT LABS AND TESTS: BMET    Component Value Date/Time   NA 136 09/14/2017 1001   NA 142 08/09/2017 0814   K 4.3 09/14/2017 1001   CL 105 09/14/2017 1001   CO2 25 09/14/2017 1001   GLUCOSE 103 (H) 09/14/2017 1001   BUN 17 09/14/2017 1001   BUN 18 08/09/2017 0814   CREATININE 0.91 09/14/2017 1001   CALCIUM 9.0 09/14/2017 1001   GFRNONAA >60 09/14/2017 1001   GFRAA >60 09/14/2017 1001   Lab Results  Component Value Date   HGBA1C 5.6 08/09/2017   HGBA1C 5.6 05/08/2017   HGBA1C 5.9 (H) 01/02/2017   Lab Results  Component Value Date   INSULIN 14.4 08/09/2017   INSULIN 20.1  05/08/2017   INSULIN 25.0 (H) 01/02/2017   CBC    Component Value Date/Time   WBC 6.1 09/14/2017 1015   RBC 4.26 09/14/2017 1015   HGB 11.7 (L) 09/14/2017 1015   HGB 11.5 08/09/2017 0814   HCT 37.2 09/14/2017 1015   HCT 36.8 08/09/2017 0814   PLT 198 09/14/2017 1015   MCV 87.3 09/14/2017 1015   MCV 86 08/09/2017 0814   MCH 27.5 09/14/2017 1015   MCHC 31.5 09/14/2017 1015   RDW 15.0 09/14/2017 1015   RDW 14.9 08/09/2017 0814   LYMPHSABS 1.8 08/09/2017 0814   EOSABS 0.2 08/09/2017 0814   BASOSABS 0.0 08/09/2017 0814   Iron/TIBC/Ferritin/ %Sat No results found for: IRON, TIBC, FERRITIN, IRONPCTSAT  Lipid Panel     Component Value Date/Time   CHOL 175 08/09/2017 0814   TRIG 118 08/09/2017 0814   HDL 55 08/09/2017 0814   LDLCALC 96 08/09/2017 0814   Hepatic Function Panel     Component Value Date/Time   PROT 7.1 08/09/2017 0814   ALBUMIN 4.2 08/09/2017 0814   AST 11 08/09/2017 0814   ALT 7 08/09/2017 0814   ALKPHOS 71 08/09/2017 0814   BILITOT 0.2 08/09/2017 0814      Component Value Date/Time   TSH 4.280 01/02/2017 1007    ASSESSMENT AND PLAN: Vitamin D deficiency - Plan: Vitamin D, Ergocalciferol, (DRISDOL) 50000 units CAPS capsule  Prediabetes - Plan: metFORMIN (GLUCOPHAGE) 500 MG tablet  At risk for diabetes mellitus  Class 1 obesity with serious comorbidity and body mass index (BMI) of 30.0 to 30.9 in adult, unspecified obesity type - Starting BMI greater then 30  PLAN:  Vitamin D Deficiency Elianne was informed that low vitamin D levels contributes to fatigue and are associated with obesity, breast, and colon cancer. Breckin agrees to continue taking prescription Vit D @50 ,000 IU every week #4 and we will refill for 1 month. She will follow up for routine testing of vitamin D, at least 2-3 times per year. She was informed of the risk of over-replacement of vitamin D and agrees to not increase her dose unless he discusses this with [place] first. Ajai  agrees to follow up with our clinic in 3 weeks.  Pre-Diabetes Arletta will continue to work on weight loss, exercise, and decreasing simple carbohydrates in her diet to help decrease the risk of diabetes. We dicussed metformin including benefits and risks. She was informed that eating too many simple carbohydrates or too many calories at one sitting increases the likelihood of GI side effects. Matie agrees to continue taking metformin 500 mg q AM #30 and we will refill for 1 month. Marija agrees to follow up with our clinic in 3 weeks as directed to monitor her progress.  Diabetes risk counselling Katiejo was given extended (15 minutes) diabetes prevention counseling today. She is 51 y.o. female and has risk factors for diabetes including obesity and pre-diabetes. We discussed intensive lifestyle modifications today with an emphasis on weight loss as well as increasing exercise and decreasing simple carbohydrates in her diet.  Obesity Rivka is currently in the action stage of change. As such, her goal is to continue with weight loss efforts She has agreed to keep a food journal with 1200 calories and 75 grams of protein daily Anessia has been instructed to work up to a goal of 150 minutes of combined cardio and strengthening exercise per week for weight loss and overall health benefits. We discussed the following Behavioral Modification Stratagies today: increasing lean protein intake, work on meal planning and easy cooking plans, and celebration eating strategies   Terena has agreed to follow up with our clinic in 3 weeks. She was informed of the importance of frequent follow up visits to maximize her success with intensive lifestyle modifications for her multiple health conditions.  I, Trixie Dredge, am acting as transcriptionist for Lacy Duverney, PA-C  I have reviewed the above documentation for accuracy and completeness, and I agree with the above. -Lacy Duverney, PA-C  I  have reviewed the above note and agree with the plan. -Dennard Nip, MD     Today's visit was # 17 out of 22.  Starting weight: 216 lbs Starting date: 01/02/17 Today's weight : 189 lbs  Today's date: 10/03/2017 Total lbs lost to date: 67 (Patients must lose 7 lbs in the first 6 months to continue with counseling)   ASK: We discussed the diagnosis of obesity with Yevette Edwards today and Paislynn agreed to give [place] permission to discuss obesity behavioral modification therapy today.  ASSESS: Aster has the diagnosis of obesity and her BMI today is 29.59 Ferrell is in the action stage of change   ADVISE: Ocia was educated on the multiple health risks of obesity as well as the benefit of weight loss to improve her health. She was advised of the need for long term treatment and the importance of lifestyle modifications.  AGREE: Multiple dietary modification options and treatment options were discussed and  Jalissa agreed to keep a food journal with 1200 calories and 75 grams of protein daily We discussed the following Behavioral Modification Strategies today: increasing lean protein intake, work on meal planning and easy cooking plans, and celebration eating strategies

## 2017-10-18 MED FILL — ESTRADIOL 0.075 MG/24HR PTT: 0.075 | 28 days supply | Qty: 8 | Fill #1

## 2017-10-23 MED FILL — metFORMIN HCL 500 MG TABS: 500 | 30 days supply | Qty: 30 | Fill #0

## 2017-10-25 ENCOUNTER — Ambulatory Visit (INDEPENDENT_AMBULATORY_CARE_PROVIDER_SITE_OTHER): Payer: 59 | Admitting: Family Medicine

## 2017-10-25 ENCOUNTER — Ambulatory Visit (INDEPENDENT_AMBULATORY_CARE_PROVIDER_SITE_OTHER): Payer: 59 | Admitting: Physician Assistant

## 2017-10-25 ENCOUNTER — Encounter (INDEPENDENT_AMBULATORY_CARE_PROVIDER_SITE_OTHER): Payer: Self-pay

## 2017-10-25 DIAGNOSIS — E669 Obesity, unspecified: Secondary | ICD-10-CM | POA: Diagnosis not present

## 2017-10-25 DIAGNOSIS — R7303 Prediabetes: Secondary | ICD-10-CM

## 2017-10-25 DIAGNOSIS — Z6830 Body mass index (BMI) 30.0-30.9, adult: Secondary | ICD-10-CM

## 2017-10-25 MED FILL — VIT D2 1.25 MG (50,000 UNIT: 1.25 MG | 28 days supply | Qty: 2 | Fill #0

## 2017-10-25 NOTE — Progress Notes (Signed)
Office: [phone]  /  Fax: [phone]   HPI:   Chief Complaint: OBESITY Whitney Davenport is here to discuss her progress with her obesity treatment plan. She is on the keep a food journal with 1200 calories and 75 grams of protein daily and is following her eating plan approximately 20 % of the time. She states she is exercising 0 minutes 0 times per week. Whitney Davenport had increase celebration eating over the holidays but tried to portion control and increase lean protein. She recently joined the [facility] and wants to start swimming.  Her weight is 192 lbs today and has gained 3 pounds since her last visit. She has lost 24 lbs since starting treatment with [place].  Pre-Diabetes Whitney Davenport has a diagnosis of pre-diabetes based on her elevated Hgb A1c and was informed this puts her at greater risk of developing diabetes. She is on metformin and she is doing well with diet prescription and continues to work on exercise to decrease risk of diabetes. She denies nausea, vomiting, or hypoglycemia.  ALLERGIES: Allergies  Allergen Reactions  . Sulfa Antibiotics Nausea And Vomiting and Other (See Comments)    Causes headache with nausea  . Hydrocodone Itching and Rash  . Zithromax [Azithromycin] Itching and Rash    MEDICATIONS: Current Outpatient Medications on File Prior to Visit  Medication Sig Dispense Refill  . cetirizine (ZYRTEC) 10 MG tablet Take 10 mg by mouth at bedtime.     . clonazePAM (KLONOPIN) 0.5 MG tablet Take 0.5 mg by mouth 2 (two) times daily as needed for anxiety.     Marland Kitchen estradiol (VIVELLE-DOT) 0.075 MG/24HR Place 1 patch onto the skin 2 (two) times a week.    . metFORMIN (GLUCOPHAGE) 500 MG tablet Take 1 tablet (500 mg total) by mouth every morning. 30 tablet 0  . progesterone (PROMETRIUM) 100 MG capsule Take 100 mg by mouth at bedtime.    . sertraline (ZOLOFT) 100 MG tablet Take 100 mg by mouth at bedtime.     . simvastatin (ZOCOR) 40 MG tablet Take 40 mg by mouth at bedtime.     .  Vitamin D, Ergocalciferol, (DRISDOL) 50000 units CAPS capsule Take 1 capsule (50,000 Units total) by mouth every 14 (fourteen) days. 4 capsule 0   No current facility-administered medications on file prior to visit.     PAST MEDICAL HISTORY: Past Medical History:  Diagnosis Date  . Allergy   . Anemia    hx of  . Anxiety   . Back injury    "ruptured disc-no problems since surgery"  . Back pain   . Constipation   . Depression   . GERD (gastroesophageal reflux disease)    occ  . Hepatitis 2004   hx of hep c-"interferon treatment, does not show up on blood test anymore"  . History of kidney stones   . Hyperlipidemia   . Hypotension    no fainting in years  . Infertility, female   . Lumbar disc disease   . Osteoarthritis   . Vitamin D deficiency     PAST SURGICAL HISTORY: Past Surgical History:  Procedure Laterality Date  . DIAGNOSTIC LAPAROSCOPY  last 2001   for endometriosis, couple of surgeries  . GANGLION CYST EXCISION  age 58  . LUMBAR LAMINECTOMY/DECOMPRESSION MICRODISCECTOMY  11/03/2011   Procedure: LUMBAR LAMINECTOMY/DECOMPRESSION MICRODISCECTOMY;  Surgeon: Hosie Spangle, MD;  Location: [facility];  Service: Neurosurgery;  Laterality: Left;  LEFT Lumbar five sacral one laminotomy and microdiskectomy  . TOTAL HIP ARTHROPLASTY  Right 02/03/2015   Procedure: RIGHT TOTAL HIP ARTHROPLASTY ANTERIOR APPROACH;  Surgeon: Paralee Cancel, MD;  Location: [facility];  Service: Orthopedics;  Laterality: Right;    SOCIAL HISTORY: Social History   Tobacco Use  . Smoking status: Former Smoker    Years: 15.00    Types: Cigarettes    Last attempt to quit: 10/31/2010    Years since quitting: 6.9  . Smokeless tobacco: Never Used  Substance Use Topics  . Alcohol use: Yes    Comment: rare  . Drug use: No    FAMILY HISTORY: Family History  Problem Relation Age of Onset  . Thyroid disease Mother   . Diabetes Father   . Hyperlipidemia Father   . Obesity Father   . Cancer  Maternal Grandmother   . Colon cancer Neg Hx     ROS: Review of Systems  Constitutional: Negative for weight loss.  Gastrointestinal: Negative for nausea and vomiting.  Endo/Heme/Allergies:       Negative hypoglycemia    PHYSICAL EXAM: There were no vitals taken for this visit. There is no height or weight on file to calculate BMI. Physical Exam  Constitutional: She is oriented to person, place, and time. She appears well-developed and well-nourished.  Cardiovascular: Normal rate.  Pulmonary/Chest: Effort normal.  Musculoskeletal: Normal range of motion.  Neurological: She is oriented to person, place, and time.  Skin: Skin is warm and dry.  Psychiatric: She has a normal mood and affect. Her behavior is normal.  Vitals reviewed.   RECENT LABS AND TESTS: BMET    Component Value Date/Time   NA 136 09/14/2017 1001   NA 142 08/09/2017 0814   K 4.3 09/14/2017 1001   CL 105 09/14/2017 1001   CO2 25 09/14/2017 1001   GLUCOSE 103 (H) 09/14/2017 1001   BUN 17 09/14/2017 1001   BUN 18 08/09/2017 0814   CREATININE 0.91 09/14/2017 1001   CALCIUM 9.0 09/14/2017 1001   GFRNONAA >60 09/14/2017 1001   GFRAA >60 09/14/2017 1001   Lab Results  Component Value Date   HGBA1C 5.6 08/09/2017   HGBA1C 5.6 05/08/2017   HGBA1C 5.9 (H) 01/02/2017   Lab Results  Component Value Date   INSULIN 14.4 08/09/2017   INSULIN 20.1 05/08/2017   INSULIN 25.0 (H) 01/02/2017   CBC    Component Value Date/Time   WBC 6.1 09/14/2017 1015   RBC 4.26 09/14/2017 1015   HGB 11.7 (L) 09/14/2017 1015   HGB 11.5 08/09/2017 0814   HCT 37.2 09/14/2017 1015   HCT 36.8 08/09/2017 0814   PLT 198 09/14/2017 1015   MCV 87.3 09/14/2017 1015   MCV 86 08/09/2017 0814   MCH 27.5 09/14/2017 1015   MCHC 31.5 09/14/2017 1015   RDW 15.0 09/14/2017 1015   RDW 14.9 08/09/2017 0814   LYMPHSABS 1.8 08/09/2017 0814   EOSABS 0.2 08/09/2017 0814   BASOSABS 0.0 08/09/2017 0814   Iron/TIBC/Ferritin/ %Sat No  results found for: IRON, TIBC, FERRITIN, IRONPCTSAT Lipid Panel     Component Value Date/Time   CHOL 175 08/09/2017 0814   TRIG 118 08/09/2017 0814   HDL 55 08/09/2017 0814   LDLCALC 96 08/09/2017 0814   Hepatic Function Panel     Component Value Date/Time   PROT 7.1 08/09/2017 0814   ALBUMIN 4.2 08/09/2017 0814   AST 11 08/09/2017 0814   ALT 7 08/09/2017 0814   ALKPHOS 71 08/09/2017 0814   BILITOT 0.2 08/09/2017 0814      Component Value  Date/Time   TSH 4.280 01/02/2017 1007    ASSESSMENT AND PLAN: Prediabetes  Class 1 obesity with serious comorbidity and body mass index (BMI) of 30.0 to 30.9 in adult, unspecified obesity type  PLAN:  Pre-Diabetes Whitney Davenport will continue to work on diet, weight loss, exercise, and decreasing simple carbohydrates in her diet to help decrease the risk of diabetes. We dicussed metformin including benefits and risks. She was informed that eating too many simple carbohydrates or too many calories at one sitting increases the likelihood of GI side effects. Whitney Davenport agrees to continue taking metformin as prescribed. Whitney Davenport agrees to follow up with our clinic in 3 weeks as directed to monitor her progress and we will check labs at that time.  We spent > than 50% of the 15 minute visit on the counseling as documented in the note.  Obesity Whitney Davenport is currently in the action stage of change. As such, her goal is to continue with weight loss efforts She has agreed to follow the Category 2 plan Whitney Davenport has been instructed to work up to a goal of 150 minutes of combined cardio and strengthening exercise per week or 30 to 60 minutes 2 times per week for weight loss and overall health benefits. We discussed the following Behavioral Modification Strategies today: increasing lean protein intake and work on meal planning and easy cooking plans   Whitney Davenport has agreed to follow up with our clinic in 3 weeks. She was informed of the importance of  frequent follow up visits to maximize her success with intensive lifestyle modifications for her multiple health conditions.   OBESITY BEHAVIORAL INTERVENTION VISIT  Today's visit was # 18 out of 22.  Starting weight: 216 lbs Starting date: 01/02/17 Today's weight : 192 lbs  Today's date: 10/25/2017 Total lbs lost to date: 24 (Patients must lose 7 lbs in the first 6 months to continue with counseling)   ASK: We discussed the diagnosis of obesity with Whitney Davenport today and Whitney Davenport agreed to give [place] permission to discuss obesity behavioral modification therapy today.  ASSESS: Whitney Davenport has the diagnosis of obesity and her BMI today is 30.06 Whitney Davenport is in the action stage of change   ADVISE: Sharnette was educated on the multiple health risks of obesity as well as the benefit of weight loss to improve her health. She was advised of the need for long term treatment and the importance of lifestyle modifications.  AGREE: Multiple dietary modification options and treatment options were discussed and  Sage agreed to the above obesity treatment plan.  I, Trixie Dredge, am acting as transcriptionist for Dennard Nip, MD  I have reviewed the above documentation for accuracy and completeness, and I agree with the above. -Dennard Nip, MD

## 2017-10-31 MED FILL — PROGESTERONE 100 MG CAPSULE: 100 | 30 days supply | Qty: 30 | Fill #4

## 2017-11-13 MED FILL — ESTRADIOL 0.075 MG/24HR PTT: 0.075 | 28 days supply | Qty: 8 | Fill #2

## 2017-11-15 ENCOUNTER — Ambulatory Visit (INDEPENDENT_AMBULATORY_CARE_PROVIDER_SITE_OTHER): Payer: 59 | Admitting: Physician Assistant

## 2017-11-22 ENCOUNTER — Ambulatory Visit (INDEPENDENT_AMBULATORY_CARE_PROVIDER_SITE_OTHER): Payer: 59 | Admitting: Physician Assistant

## 2017-11-22 VITALS — BP 113/77 | HR 65 | Temp 97.9°F | Ht 67.0 in | Wt 192.0 lb

## 2017-11-22 DIAGNOSIS — R7303 Prediabetes: Secondary | ICD-10-CM

## 2017-11-22 DIAGNOSIS — D649 Anemia, unspecified: Secondary | ICD-10-CM

## 2017-11-22 DIAGNOSIS — Z9189 Other specified personal risk factors, not elsewhere classified: Secondary | ICD-10-CM | POA: Diagnosis not present

## 2017-11-22 DIAGNOSIS — E7849 Other hyperlipidemia: Secondary | ICD-10-CM

## 2017-11-22 DIAGNOSIS — E559 Vitamin D deficiency, unspecified: Secondary | ICD-10-CM | POA: Diagnosis not present

## 2017-11-22 DIAGNOSIS — Z6830 Body mass index (BMI) 30.0-30.9, adult: Secondary | ICD-10-CM

## 2017-11-22 DIAGNOSIS — E669 Obesity, unspecified: Secondary | ICD-10-CM

## 2017-11-22 MED ORDER — FERROUS SULFATE 325 (65 FE) MG PO TABS: 325.0000 mg | ORAL_TABLET | Freq: Every day | ORAL | 0 refills | Status: DC

## 2017-11-22 MED ORDER — VITAMIN D (ERGOCALCIFEROL) 1.25 MG (50000 UNIT) PO CAPS: 50000.0000 [IU] | ORAL_CAPSULE | ORAL | 0 refills | Status: DC

## 2017-11-22 MED ORDER — METFORMIN HCL 500 MG PO TABS: 500.0000 mg | ORAL_TABLET | Freq: Every morning | ORAL | 0 refills | Status: DC

## 2017-11-22 MED FILL — VIT D2 1.25 MG (50,000 UNIT: 1.25 MG | 28 days supply | Qty: 4 | Fill #0

## 2017-11-22 MED FILL — metFORMIN HCL 500 MG TABS: 500 | 30 days supply | Qty: 30 | Fill #0

## 2017-11-22 NOTE — Progress Notes (Signed)
Office: [phone]  /  Fax: [phone]   HPI:   Chief Complaint: OBESITY Whitney Davenport is here to discuss her progress with her obesity treatment plan. She is on the Category 2 plan and is following her eating plan approximately 50 % of the time. She states she is exercising 0 minutes 0 times per week. Whitney Davenport maintained her weight. She has been mindful of her eating but is not journaling her meals. She is advised on going back on the structured meal plan and incorporate variety into her supper meal.  Her weight is 192 lb (87.1 kg) today and has not lost weight since her last visit. She has lost 24 lbs since starting treatment with [place].  Pre-Diabetes Whitney Davenport has a diagnosis of pre-diabetes based on her elevated Hgb A1c and was informed this puts her at greater risk of developing diabetes. She is taking metformin currently and continues to work on diet and exercise to decrease risk of diabetes. She denies polyphagia, nausea or hypoglycemia.  Vitamin D deficiency Whitney Davenport has a diagnosis of vitamin D deficiency. She is currently taking prescription Vit D and denies nausea, vomiting or muscle weakness.  Hyperlipidemia Whitney Davenport has hyperlipidemia and has been trying to improve her cholesterol levels with intensive lifestyle modification including a low saturated fat diet, exercise and weight loss. She is on Zocor and she denies any chest pain, claudication or myalgias.  At risk for cardiovascular disease Whitney Davenport is at a higher than average risk for cardiovascular disease due to obesity and hyperlipidemia. She currently denies any chest pain.  Anemia Whitney Davenport has a diagnosis of anemia. Whitney Davenport's Hgb at 11.7 and she admits to history of iron deficiency anemia. She states she is unable to take iron due to constipation. She admits fatigue and denies dyspnea or chest pain. She is not on iron supplementation.   ALLERGIES: Allergies  Allergen Reactions  . Sulfa Antibiotics Nausea And  Vomiting and Other (See Comments)    Causes headache with nausea  . Hydrocodone Itching and Rash  . Zithromax [Azithromycin] Itching and Rash    MEDICATIONS: Current Outpatient Medications on File Prior to Visit  Medication Sig Dispense Refill  . cetirizine (ZYRTEC) 10 MG tablet Take 10 mg by mouth at bedtime.     . clonazePAM (KLONOPIN) 0.5 MG tablet Take 0.5 mg by mouth 2 (two) times daily as needed for anxiety.     Marland Kitchen estradiol (VIVELLE-DOT) 0.075 MG/24HR Place 1 patch onto the skin 2 (two) times a week.    . metFORMIN (GLUCOPHAGE) 500 MG tablet Take 1 tablet (500 mg total) by mouth every morning. 30 tablet 0  . progesterone (PROMETRIUM) 100 MG capsule Take 100 mg by mouth at bedtime.    . sertraline (ZOLOFT) 100 MG tablet Take 100 mg by mouth at bedtime.     . simvastatin (ZOCOR) 40 MG tablet Take 40 mg by mouth at bedtime.     . Vitamin D, Ergocalciferol, (DRISDOL) 50000 units CAPS capsule Take 1 capsule (50,000 Units total) by mouth every 14 (fourteen) days. 4 capsule 0   No current facility-administered medications on file prior to visit.     PAST MEDICAL HISTORY: Past Medical History:  Diagnosis Date  . Allergy   . Anemia    hx of  . Anxiety   . Back injury    "ruptured disc-no problems since surgery"  . Back pain   . Constipation   . Depression   . GERD (gastroesophageal reflux disease)    occ  .  Hepatitis 2004   hx of hep c-"interferon treatment, does not show up on blood test anymore"  . History of kidney stones   . Hyperlipidemia   . Hypotension    no fainting in years  . Infertility, female   . Lumbar disc disease   . Osteoarthritis   . Vitamin D deficiency     PAST SURGICAL HISTORY: Past Surgical History:  Procedure Laterality Date  . DIAGNOSTIC LAPAROSCOPY  last 2001   for endometriosis, couple of surgeries  . GANGLION CYST EXCISION  age 35  . LUMBAR LAMINECTOMY/DECOMPRESSION MICRODISCECTOMY  11/03/2011   Procedure: LUMBAR LAMINECTOMY/DECOMPRESSION  MICRODISCECTOMY;  Surgeon: Hosie Spangle, MD;  Location: [facility];  Service: Neurosurgery;  Laterality: Left;  LEFT Lumbar five sacral one laminotomy and microdiskectomy  . TOTAL HIP ARTHROPLASTY Right 02/03/2015   Procedure: RIGHT TOTAL HIP ARTHROPLASTY ANTERIOR APPROACH;  Surgeon: Paralee Cancel, MD;  Location: [facility];  Service: Orthopedics;  Laterality: Right;    SOCIAL HISTORY: Social History   Tobacco Use  . Smoking status: Former Smoker    Years: 15.00    Types: Cigarettes    Last attempt to quit: 10/31/2010    Years since quitting: 7.0  . Smokeless tobacco: Never Used  Substance Use Topics  . Alcohol use: Yes    Comment: rare  . Drug use: No    FAMILY HISTORY: Family History  Problem Relation Age of Onset  . Thyroid disease Mother   . Diabetes Father   . Hyperlipidemia Father   . Obesity Father   . Cancer Maternal Grandmother   . Colon cancer Neg Hx     ROS: Review of Systems  Constitutional: Positive for malaise/fatigue. Negative for weight loss.  Cardiovascular: Negative for chest pain and claudication.  Gastrointestinal: Negative for nausea and vomiting.  Musculoskeletal: Negative for myalgias.       Negative muscle weakness  Endo/Heme/Allergies:       Negative polyphagia Negative hypoglycemia    PHYSICAL EXAM: Blood pressure 113/77, pulse 65, temperature 97.9 F (36.6 C), temperature source Oral, height 5\' 7"  (1.702 m), weight 192 lb (87.1 kg), SpO2 100 %. Body mass index is 30.07 kg/m. Physical Exam  Constitutional: She is oriented to person, place, and time. She appears well-developed and well-nourished.  Cardiovascular: Normal rate.  Pulmonary/Chest: Effort normal.  Musculoskeletal: Normal range of motion.  Neurological: She is oriented to person, place, and time.  Skin: Skin is warm and dry.  Psychiatric: She has a normal mood and affect. Her behavior is normal.  Vitals reviewed.   RECENT LABS AND TESTS: BMET    Component Value  Date/Time   NA 136 09/14/2017 1001   NA 142 08/09/2017 0814   K 4.3 09/14/2017 1001   CL 105 09/14/2017 1001   CO2 25 09/14/2017 1001   GLUCOSE 103 (H) 09/14/2017 1001   BUN 17 09/14/2017 1001   BUN 18 08/09/2017 0814   CREATININE 0.91 09/14/2017 1001   CALCIUM 9.0 09/14/2017 1001   GFRNONAA >60 09/14/2017 1001   GFRAA >60 09/14/2017 1001   Lab Results  Component Value Date   HGBA1C 5.6 08/09/2017   HGBA1C 5.6 05/08/2017   HGBA1C 5.9 (H) 01/02/2017   Lab Results  Component Value Date   INSULIN 14.4 08/09/2017   INSULIN 20.1 05/08/2017   INSULIN 25.0 (H) 01/02/2017   CBC    Component Value Date/Time   WBC 6.1 09/14/2017 1015   RBC 4.26 09/14/2017 1015   HGB 11.7 (L) 09/14/2017 1015  HGB 11.5 08/09/2017 0814   HCT 37.2 09/14/2017 1015   HCT 36.8 08/09/2017 0814   PLT 198 09/14/2017 1015   MCV 87.3 09/14/2017 1015   MCV 86 08/09/2017 0814   MCH 27.5 09/14/2017 1015   MCHC 31.5 09/14/2017 1015   RDW 15.0 09/14/2017 1015   RDW 14.9 08/09/2017 0814   LYMPHSABS 1.8 08/09/2017 0814   EOSABS 0.2 08/09/2017 0814   BASOSABS 0.0 08/09/2017 0814   Iron/TIBC/Ferritin/ %Sat No results found for: IRON, TIBC, FERRITIN, IRONPCTSAT Lipid Panel     Component Value Date/Time   CHOL 175 08/09/2017 0814   TRIG 118 08/09/2017 0814   HDL 55 08/09/2017 0814   LDLCALC 96 08/09/2017 0814   Hepatic Function Panel     Component Value Date/Time   PROT 7.1 08/09/2017 0814   ALBUMIN 4.2 08/09/2017 0814   AST 11 08/09/2017 0814   ALT 7 08/09/2017 0814   ALKPHOS 71 08/09/2017 0814   BILITOT 0.2 08/09/2017 0814      Component Value Date/Time   TSH 4.280 01/02/2017 1007    ASSESSMENT AND PLAN: Prediabetes - Plan: Comprehensive metabolic panel, Hemoglobin A1c, Insulin, random, metFORMIN (GLUCOPHAGE) 500 MG tablet  Vitamin D deficiency - Plan: VITAMIN D 25 Hydroxy (Vit-D Deficiency, Fractures), Vitamin D, Ergocalciferol, (DRISDOL) 50000 units CAPS capsule  Other hyperlipidemia -  Plan: Lipid Panel With LDL/HDL Ratio  Anemia, unspecified type - Plan: CBC With Differential, ferrous sulfate (FEOSOL) 325 (65 FE) MG tablet  At risk for heart disease  Class 1 obesity with serious comorbidity and body mass index (BMI) of 30.0 to 30.9 in adult, unspecified obesity type  PLAN:  Pre-Diabetes Kitiara will continue to work on weight loss, exercise, and decreasing simple carbohydrates in her diet to help decrease the risk of diabetes. We dicussed metformin including benefits and risks. She was informed that eating too many simple carbohydrates or too many calories at one sitting increases the likelihood of GI side effects. Whitney Davenport agrees to continue taking metformin 500 mg qd #30 and we will refill for 1 month. We will check labs and Whitney Davenport agrees to follow up with our clinic in 2 weeks as directed to monitor her progress.  Vitamin D Deficiency Whitney Davenport was informed that low vitamin D levels contributes to fatigue and are associated with obesity, breast, and colon cancer. Whitney Davenport agrees to continue taking prescription Vit D @50 ,000 IU every week #4 and we will refill for 1 month. She will follow up for routine testing of vitamin D, at least 2-3 times per year. She was informed of the risk of over-replacement of vitamin D and agrees to not increase her dose unless she discusses this with [place] first. We will check labs and Whitney Davenport agrees to follow up with our clinic in 2 weeks.  Hyperlipidemia Whitney Davenport was informed of the American Heart Association Guidelines emphasizing intensive lifestyle modifications as the first line treatment for hyperlipidemia. We discussed many lifestyle modifications today in depth, and Whitney Davenport will continue to work on decreasing saturated fats such as fatty red meat, butter and many fried foods. She will also increase vegetables and lean protein in her diet and continue to work on exercise and weight loss efforts. Whitney Davenport agrees to continue taking  her medications as prescribed. We will check labs and Whitney Davenport agrees to follow up with our clinic in 2 weeks.  Cardiovascular risk counselling Whitney Davenport was given extended (15 minutes) coronary artery disease prevention counseling today. She is 52 y.o. female and has risk factors for  heart disease including obesity and hyperlipidemia. We discussed intensive lifestyle modifications today with an emphasis on specific weight loss instructions and strategies. Pt was also informed of the importance of increasing exercise and decreasing saturated fats to help prevent heart disease.  Anemia The diagnosis of Iron deficiency anemia was discussed with Whitney Davenport and was explained in detail. She was given suggestions of iron rich foods and and Whitney Davenport agrees to start ferrous sulfate 325 mg OTC daily. We will check labs and Whitney Davenport agrees to follow up with our clinic in 2 weeks.  Obesity Whitney Davenport is currently in the action stage of change. As such, her goal is to continue with weight loss efforts She has agreed to keep a food journal with 500 calories and 40 grams of protein supper daily and follow the Category 2 plan Whitney Davenport has been instructed to work up to a goal of 150 minutes of combined cardio and strengthening exercise per week for weight loss and overall health benefits. We discussed the following Behavioral Modification Strategies today: increasing lean protein intake and keep a strict food journal   Margret has agreed to follow up with our clinic in 2 weeks. She was informed of the importance of frequent follow up visits to maximize her success with intensive lifestyle modifications for her multiple health conditions.   OBESITY BEHAVIORAL INTERVENTION VISIT  Today's visit was # 20 out of 22.  Starting weight: 216 lbs Starting date: 01/02/17 Today's weight : 192 lbs  Today's date: 11/22/2017 Total lbs lost to date: 24 (Patients must lose 7 lbs in the first 6 months to continue with  counseling)   ASK: We discussed the diagnosis of obesity with Whitney Davenport today and Whitney Davenport agreed to give [place] permission to discuss obesity behavioral modification therapy today.  ASSESS: Whitney Davenport has the diagnosis of obesity and her BMI today is 30.06 Whitney Davenport is in the action stage of change   ADVISE: Whitney Davenport was educated on the multiple health risks of obesity as well as the benefit of weight loss to improve her health. She was advised of the need for long term treatment and the importance of lifestyle modifications.  AGREE: Multiple dietary modification options and treatment options were discussed and  Madison agreed to the above obesity treatment plan.   Wilhemena Durie, am acting as transcriptionist for Lacy Duverney, PA-C I, Lacy Duverney [facility], have reviewed this note and agree with its content.

## 2017-11-23 LAB — COMPREHENSIVE METABOLIC PANEL
A/G RATIO: 1.6 (ref 1.2–2.2)
ALBUMIN: 4.5 g/dL (ref 3.5–5.5)
ALT: 9 IU/L (ref 0–32)
AST: 10 IU/L (ref 0–40)
Alkaline Phosphatase: 66 IU/L (ref 39–117)
BUN/Creatinine Ratio: 13 (ref 9–23)
BUN: 12 mg/dL (ref 6–24)
Bilirubin Total: 0.3 mg/dL (ref 0.0–1.2)
CALCIUM: 9.3 mg/dL (ref 8.7–10.2)
CO2: 23 mmol/L (ref 20–29)
CREATININE: 0.95 mg/dL (ref 0.57–1.00)
Chloride: 105 mmol/L (ref 96–106)
GFR, EST AFRICAN AMERICAN: 79 mL/min/{1.73_m2} (ref >59)
GFR, EST NON AFRICAN AMERICAN: 69 mL/min/{1.73_m2} (ref >59)
Globulin, Total: 2.9 g/dL (ref 1.5–4.5)
Glucose: 95 mg/dL (ref 65–99)
POTASSIUM: 5 mmol/L (ref 3.5–5.2)
SODIUM: 146 mmol/L — AB (ref 134–144)
Total Protein: 7.4 g/dL (ref 6.0–8.5)

## 2017-11-23 LAB — CBC WITH DIFFERENTIAL
Basophils Absolute: 0 10*3/uL (ref 0.0–0.2)
Basos: 1 %
EOS (ABSOLUTE): 0.1 10*3/uL (ref 0.0–0.4)
EOS: 3 %
HEMATOCRIT: 38.3 % (ref 34.0–46.6)
HEMOGLOBIN: 11.9 g/dL (ref 11.1–15.9)
Immature Grans (Abs): 0 10*3/uL (ref 0.0–0.1)
Immature Granulocytes: 0 %
Lymphocytes Absolute: 1.5 10*3/uL (ref 0.7–3.1)
Lymphs: 27 %
MCH: 27.4 pg (ref 26.6–33.0)
MCHC: 31.1 g/dL — ABNORMAL LOW (ref 31.5–35.7)
MCV: 88 fL (ref 79–97)
MONOCYTES: 6 %
MONOS ABS: 0.3 10*3/uL (ref 0.1–0.9)
Neutrophils Absolute: 3.7 10*3/uL (ref 1.4–7.0)
Neutrophils: 63 %
RBC: 4.34 x10E6/uL (ref 3.77–5.28)
RDW: 15 % (ref 12.3–15.4)
WBC: 5.7 10*3/uL (ref 3.4–10.8)

## 2017-11-23 LAB — LIPID PANEL WITH LDL/HDL RATIO
Cholesterol, Total: 192 mg/dL (ref 100–199)
HDL: 65 mg/dL (ref >39)
LDL Calculated: 104 mg/dL — ABNORMAL HIGH (ref 0–99)
LDL/HDL RATIO: 1.6 ratio (ref 0.0–3.2)
TRIGLYCERIDES: 117 mg/dL (ref 0–149)
VLDL CHOLESTEROL CAL: 23 mg/dL (ref 5–40)

## 2017-11-23 LAB — HEMOGLOBIN A1C
Est. average glucose Bld gHb Est-mCnc: 120 mg/dL
Hgb A1c MFr Bld: 5.8 % — ABNORMAL HIGH (ref 4.8–5.6)

## 2017-11-23 LAB — INSULIN, RANDOM: INSULIN: 12.1 u[IU]/mL (ref 2.6–24.9)

## 2017-11-23 LAB — VITAMIN D 25 HYDROXY (VIT D DEFICIENCY, FRACTURES): VIT D 25 HYDROXY: 36.3 ng/mL (ref 30.0–100.0)

## 2017-11-27 MED FILL — PROGESTERONE 100 MG CAPSULE: 100 | 30 days supply | Qty: 30 | Fill #5

## 2017-11-28 DIAGNOSIS — E559 Vitamin D deficiency, unspecified: Secondary | ICD-10-CM | POA: Diagnosis not present

## 2017-11-28 DIAGNOSIS — E782 Mixed hyperlipidemia: Secondary | ICD-10-CM | POA: Diagnosis not present

## 2017-11-28 DIAGNOSIS — R7303 Prediabetes: Secondary | ICD-10-CM | POA: Diagnosis not present

## 2017-11-28 DIAGNOSIS — Z96641 Presence of right artificial hip joint: Secondary | ICD-10-CM | POA: Diagnosis not present

## 2017-11-28 DIAGNOSIS — N951 Menopausal and female climacteric states: Secondary | ICD-10-CM | POA: Diagnosis not present

## 2017-11-28 DIAGNOSIS — J309 Allergic rhinitis, unspecified: Secondary | ICD-10-CM | POA: Diagnosis not present

## 2017-11-28 DIAGNOSIS — F419 Anxiety disorder, unspecified: Secondary | ICD-10-CM | POA: Diagnosis not present

## 2017-11-28 DIAGNOSIS — Z8619 Personal history of other infectious and parasitic diseases: Secondary | ICD-10-CM | POA: Diagnosis not present

## 2017-11-28 MED FILL — SERTRALINE HCL 100 MG TAB: 100 | 90 days supply | Qty: 90 | Fill #0 | Status: TO

## 2017-12-06 ENCOUNTER — Ambulatory Visit (INDEPENDENT_AMBULATORY_CARE_PROVIDER_SITE_OTHER): Payer: 59 | Admitting: Physician Assistant

## 2017-12-06 VITALS — BP 108/75 | HR 68 | Temp 97.8°F | Ht 67.0 in | Wt 193.0 lb

## 2017-12-06 DIAGNOSIS — E7849 Other hyperlipidemia: Secondary | ICD-10-CM

## 2017-12-06 DIAGNOSIS — E669 Obesity, unspecified: Secondary | ICD-10-CM | POA: Diagnosis not present

## 2017-12-06 DIAGNOSIS — Z6830 Body mass index (BMI) 30.0-30.9, adult: Secondary | ICD-10-CM

## 2017-12-06 NOTE — Progress Notes (Signed)
Office: [phone]  /  Fax: [phone]   HPI:   Chief Complaint: OBESITY Chareese is here to discuss her progress with her obesity treatment plan. She is on the  follow the Category 2 plan and is following her eating plan approximately 50 % of the time. She states she is not exercising. Livie has not been as mindful of her eating, and has been eating out more. She states she has not been preplanning her meals as well.  Her weight is 193 lb (87.5 kg) today and has had a weight gain of 1 pound over a period of 2 weeks since her last visit. She has lost 23 lbs since starting treatment with [place].  Hyperlipidemia Katiria has hyperlipidemia with a LDL greater than 100. She is not currently taking any medications, and declines any today. She has been trying to improve her cholesterol levels with intensive lifestyle modification including a low saturated fat diet, exercise and weight loss. She denies any chest pain, claudication or myalgias.   ALLERGIES: Allergies  Allergen Reactions  . Sulfa Antibiotics Nausea And Vomiting and Other (See Comments)    Causes headache with nausea  . Hydrocodone Itching and Rash  . Zithromax [Azithromycin] Itching and Rash    MEDICATIONS: Current Outpatient Medications on File Prior to Visit  Medication Sig Dispense Refill  . cetirizine (ZYRTEC) 10 MG tablet Take 10 mg by mouth at bedtime.     . clonazePAM (KLONOPIN) 0.5 MG tablet Take 0.5 mg by mouth 2 (two) times daily as needed for anxiety.     Marland Kitchen estradiol (VIVELLE-DOT) 0.075 MG/24HR Place 1 patch onto the skin 2 (two) times a week.    . ferrous sulfate (FEOSOL) 325 (65 FE) MG tablet Take 1 tablet (325 mg total) by mouth daily with breakfast. 30 tablet 0  . metFORMIN (GLUCOPHAGE) 500 MG tablet Take 1 tablet (500 mg total) by mouth every morning. 30 tablet 0  . progesterone (PROMETRIUM) 100 MG capsule Take 100 mg by mouth at bedtime.    . sertraline (ZOLOFT) 100 MG tablet Take 100 mg by mouth at  bedtime.     . simvastatin (ZOCOR) 40 MG tablet Take 40 mg by mouth at bedtime.     . Vitamin D, Ergocalciferol, (DRISDOL) 50000 units CAPS capsule Take 1 capsule (50,000 Units total) by mouth every 14 (fourteen) days. 4 capsule 0   No current facility-administered medications on file prior to visit.     PAST MEDICAL HISTORY: Past Medical History:  Diagnosis Date  . Allergy   . Anemia    hx of  . Anxiety   . Back injury    "ruptured disc-no problems since surgery"  . Back pain   . Constipation   . Depression   . GERD (gastroesophageal reflux disease)    occ  . Hepatitis 2004   hx of hep c-"interferon treatment, does not show up on blood test anymore"  . History of kidney stones   . Hyperlipidemia   . Hypotension    no fainting in years  . Infertility, female   . Lumbar disc disease   . Osteoarthritis   . Vitamin D deficiency     PAST SURGICAL HISTORY: Past Surgical History:  Procedure Laterality Date  . DIAGNOSTIC LAPAROSCOPY  last 2001   for endometriosis, couple of surgeries  . GANGLION CYST EXCISION  age 46  . LUMBAR LAMINECTOMY/DECOMPRESSION MICRODISCECTOMY  11/03/2011   Procedure: LUMBAR LAMINECTOMY/DECOMPRESSION MICRODISCECTOMY;  Surgeon: Hosie Spangle, MD;  Location: [facility]  NEURO ORS;  Service: Neurosurgery;  Laterality: Left;  LEFT Lumbar five sacral one laminotomy and microdiskectomy  . TOTAL HIP ARTHROPLASTY Right 02/03/2015   Procedure: RIGHT TOTAL HIP ARTHROPLASTY ANTERIOR APPROACH;  Surgeon: Paralee Cancel, MD;  Location: [facility] ORS;  Service: Orthopedics;  Laterality: Right;    SOCIAL HISTORY: Social History   Tobacco Use  . Smoking status: Former Smoker    Years: 15.00    Types: Cigarettes    Last attempt to quit: 10/31/2010    Years since quitting: 7.1  . Smokeless tobacco: Never Used  Substance Use Topics  . Alcohol use: Yes    Comment: rare  . Drug use: No    FAMILY HISTORY: Family History  Problem Relation Age of Onset  . Thyroid disease  Mother   . Diabetes Father   . Hyperlipidemia Father   . Obesity Father   . Cancer Maternal Grandmother   . Colon cancer Neg Hx     ROS: Review of Systems  Constitutional: Negative for weight loss.  Cardiovascular: Negative for chest pain and claudication.  Musculoskeletal: Negative for myalgias.    PHYSICAL EXAM: Blood pressure 108/75, pulse 68, temperature 97.8 F (36.6 C), temperature source Oral, height 5\' 7"  (1.702 m), weight 193 lb (87.5 kg), SpO2 99 %. Body mass index is 30.23 kg/m. Physical Exam  Constitutional: She is oriented to person, place, and time. She appears well-developed and well-nourished.  HENT:  Head: Normocephalic.  Eyes: EOM are normal.  Neck: Normal range of motion.  Cardiovascular: Normal rate.  Pulmonary/Chest: Effort normal.  Musculoskeletal: Normal range of motion.  Neurological: She is alert and oriented to person, place, and time.  Skin: Skin is warm and dry.  Psychiatric: She has a normal mood and affect. Her behavior is normal.  Vitals reviewed.   RECENT LABS AND TESTS: BMET    Component Value Date/Time   NA 146 (H) 11/22/2017 0925   K 5.0 11/22/2017 0925   CL 105 11/22/2017 0925   CO2 23 11/22/2017 0925   GLUCOSE 95 11/22/2017 0925   GLUCOSE 103 (H) 09/14/2017 1001   BUN 12 11/22/2017 0925   CREATININE 0.95 11/22/2017 0925   CALCIUM 9.3 11/22/2017 0925   GFRNONAA 69 11/22/2017 0925   GFRAA 79 11/22/2017 0925   Lab Results  Component Value Date   HGBA1C 5.8 (H) 11/22/2017   HGBA1C 5.6 08/09/2017   HGBA1C 5.6 05/08/2017   HGBA1C 5.9 (H) 01/02/2017   Lab Results  Component Value Date   INSULIN 12.1 11/22/2017   INSULIN 14.4 08/09/2017   INSULIN 20.1 05/08/2017   INSULIN 25.0 (H) 01/02/2017   CBC    Component Value Date/Time   WBC 5.7 11/22/2017 0925   WBC 6.1 09/14/2017 1015   RBC 4.34 11/22/2017 0925   RBC 4.26 09/14/2017 1015   HGB 11.9 11/22/2017 0925   HCT 38.3 11/22/2017 0925   PLT 198 09/14/2017 1015   MCV  88 11/22/2017 0925   MCH 27.4 11/22/2017 0925   MCH 27.5 09/14/2017 1015   MCHC 31.1 (L) 11/22/2017 0925   MCHC 31.5 09/14/2017 1015   RDW 15.0 11/22/2017 0925   LYMPHSABS 1.5 11/22/2017 0925   EOSABS 0.1 11/22/2017 0925   BASOSABS 0.0 11/22/2017 0925   Iron/TIBC/Ferritin/ %Sat No results found for: IRON, TIBC, FERRITIN, IRONPCTSAT Lipid Panel     Component Value Date/Time   CHOL 192 11/22/2017 0925   TRIG 117 11/22/2017 0925   HDL 65 11/22/2017 0925   LDLCALC 104 (H) 11/22/2017  0258   Hepatic Function Panel     Component Value Date/Time   PROT 7.4 11/22/2017 0925   ALBUMIN 4.5 11/22/2017 0925   AST 10 11/22/2017 0925   ALT 9 11/22/2017 0925   ALKPHOS 66 11/22/2017 0925   BILITOT 0.3 11/22/2017 0925      Component Value Date/Time   TSH 4.280 01/02/2017 1007    ASSESSMENT AND PLAN: Other hyperlipidemia  Class 1 obesity with serious comorbidity and body mass index (BMI) of 30.0 to 30.9 in adult, unspecified obesity type  PLAN: Hyperlipidemia Kaylei was informed of the American Heart Association Guidelines emphasizing intensive lifestyle modifications as the first line treatment for hyperlipidemia. We discussed many lifestyle modifications today in depth, and Monque will continue to work on decreasing saturated fats such as fatty red meat, butter and many fried foods. She will also increase vegetables and lean protein in her diet and continue to work on exercise and weight loss efforts.  We spent > than 50% of the 15 minute visit on the counseling as documented in the note.  Obesity Annaleigha is currently in the action stage of change. As such, her goal is to continue with weight loss efforts She has agreed to follow the Category 2 plan Azalyn has been instructed to work up to a goal of 150 minutes of combined cardio and strengthening exercise per week for weight loss and overall health benefits. We discussed the following Behavioral Modification Strategies  today: increasing lean protein intake, decrease eating out and work on meal planning and easy cooking plans   Maddy has agreed to follow up with our clinic in 2 weeks. She was informed of the importance of frequent follow up visits to maximize her success with intensive lifestyle modifications for her multiple health conditions.    Today's visit was # 21 out of 22.  Starting weight: 216 lbs Starting date: 01/02/17 Today's weight : 193 lbs Today's date: 12/06/2017 Total lbs lost to date: 23 (Patients must lose 7 lbs in the first 6 months to continue with counseling)   ASK: We discussed the diagnosis of obesity with Yevette Edwards today and Aman agreed to give [place] permission to discuss obesity behavioral modification therapy today.  ASSESS: Navaeh has the diagnosis of obesity and her BMI today is 30.23 Wilna is in the action stage of change   ADVISE: Aneita was educated on the multiple health risks of obesity as well as the benefit of weight loss to improve her health. She was advised of the need for long term treatment and the importance of lifestyle modifications.  AGREE: Multiple dietary modification options and treatment options were discussed and  Larin agreed to the above obesity treatment plan.   Leary Roca, am acting as transcriptionist for [employer], PA-C I, Lacy Duverney [facility], have reviewed this note and agree with its content.

## 2017-12-11 MED FILL — SIMVASTATIN 40 MG TABLET: 40 | 90 days supply | Qty: 90 | Fill #0 | Status: TO

## 2017-12-13 MED FILL — ESTRADIOL 0.075 MG/24HR PTT: 0.075 | 28 days supply | Qty: 8 | Fill #3

## 2017-12-20 ENCOUNTER — Ambulatory Visit (INDEPENDENT_AMBULATORY_CARE_PROVIDER_SITE_OTHER): Payer: 59 | Admitting: Physician Assistant

## 2017-12-26 ENCOUNTER — Ambulatory Visit (INDEPENDENT_AMBULATORY_CARE_PROVIDER_SITE_OTHER): Payer: 59 | Admitting: Physician Assistant

## 2017-12-26 VITALS — BP 104/71 | HR 70 | Temp 98.0°F | Ht 67.0 in | Wt 194.0 lb

## 2017-12-26 DIAGNOSIS — E559 Vitamin D deficiency, unspecified: Secondary | ICD-10-CM | POA: Diagnosis not present

## 2017-12-26 DIAGNOSIS — E669 Obesity, unspecified: Secondary | ICD-10-CM | POA: Diagnosis not present

## 2017-12-26 DIAGNOSIS — R7303 Prediabetes: Secondary | ICD-10-CM

## 2017-12-26 DIAGNOSIS — Z9189 Other specified personal risk factors, not elsewhere classified: Secondary | ICD-10-CM | POA: Diagnosis not present

## 2017-12-26 DIAGNOSIS — Z6830 Body mass index (BMI) 30.0-30.9, adult: Secondary | ICD-10-CM | POA: Diagnosis not present

## 2017-12-26 MED ORDER — METFORMIN HCL 500 MG PO TABS
500.0000 mg | ORAL_TABLET | Freq: Every morning | ORAL | 0 refills | Status: DC
Start: 2017-12-26 — End: 2018-01-17

## 2017-12-26 MED ORDER — VITAMIN D (ERGOCALCIFEROL) 1.25 MG (50000 UNIT) PO CAPS: 50000.0000 [IU] | ORAL_CAPSULE | ORAL | 0 refills | Status: DC

## 2017-12-26 MED FILL — VIT D2 1.25 MG (50,000 UNIT: 1.25 MG | 28 days supply | Qty: 4 | Fill #0

## 2017-12-26 MED FILL — metFORMIN HCL 500 MG TABS: 500 | 30 days supply | Qty: 30 | Fill #0

## 2017-12-26 NOTE — Progress Notes (Signed)
Office: [phone]  /  Fax: [phone]   HPI:   Chief Complaint: OBESITY Whitney Davenport is here to discuss her progress with her obesity treatment plan. She is on the Category 2 plan and is following her eating plan approximately 60 % of the time. She states she is doing the treadmill for 30 minutes 2 times per week. Mccartney makes smarter food choices and controls her portions. She would like more meal planning ideas and recipes.  Her weight is 194 lb (88 kg) today and has gained 1 pound since her last visit. She has lost 22 lbs since starting treatment with [place].  Pre-Diabetes Kleo has a diagnosis of pre-diabetes based on her elevated Hgb A1c and was informed this puts her at greater risk of developing diabetes. She is taking metformin currently and continues to work on diet and exercise to decrease risk of diabetes. She denies polyphagia, nausea, or hypoglycemia.  At risk for diabetes Miyoshi is at higher than average risk for developing diabetes due to her obesity and pre-diabetes. She currently denies polyuria or polydipsia.  Vitamin D Deficiency Autum has a diagnosis of vitamin D deficiency. She is currently taking prescription Vit D and denies nausea, vomiting or muscle weakness.  ALLERGIES: Allergies  Allergen Reactions  . Sulfa Antibiotics Nausea And Vomiting and Other (See Comments)    Causes headache with nausea  . Hydrocodone Itching and Rash  . Zithromax [Azithromycin] Itching and Rash    MEDICATIONS: Current Outpatient Medications on File Prior to Visit  Medication Sig Dispense Refill  . cetirizine (ZYRTEC) 10 MG tablet Take 10 mg by mouth at bedtime.     . clonazePAM (KLONOPIN) 0.5 MG tablet Take 0.5 mg by mouth 2 (two) times daily as needed for anxiety.     Marland Kitchen estradiol (VIVELLE-DOT) 0.075 MG/24HR Place 1 patch onto the skin 2 (two) times a week.    . ferrous sulfate (FEOSOL) 325 (65 FE) MG tablet Take 1 tablet (325 mg total) by mouth daily with  breakfast. 30 tablet 0  . progesterone (PROMETRIUM) 100 MG capsule Take 100 mg by mouth at bedtime.    . sertraline (ZOLOFT) 100 MG tablet Take 100 mg by mouth at bedtime.     . simvastatin (ZOCOR) 40 MG tablet Take 40 mg by mouth at bedtime.      No current facility-administered medications on file prior to visit.     PAST MEDICAL HISTORY: Past Medical History:  Diagnosis Date  . Allergy   . Anemia    hx of  . Anxiety   . Back injury    "ruptured disc-no problems since surgery"  . Back pain   . Constipation   . Depression   . GERD (gastroesophageal reflux disease)    occ  . Hepatitis 2004   hx of hep c-"interferon treatment, does not show up on blood test anymore"  . History of kidney stones   . Hyperlipidemia   . Hypotension    no fainting in years  . Infertility, female   . Lumbar disc disease   . Osteoarthritis   . Vitamin D deficiency     PAST SURGICAL HISTORY: Past Surgical History:  Procedure Laterality Date  . DIAGNOSTIC LAPAROSCOPY  last 2001   for endometriosis, couple of surgeries  . GANGLION CYST EXCISION  age 3  . LUMBAR LAMINECTOMY/DECOMPRESSION MICRODISCECTOMY  11/03/2011   Procedure: LUMBAR LAMINECTOMY/DECOMPRESSION MICRODISCECTOMY;  Surgeon: Hosie Spangle, MD;  Location: [facility] NEURO ORS;  Service: Neurosurgery;  Laterality: Left;  LEFT Lumbar five sacral one laminotomy and microdiskectomy  . TOTAL HIP ARTHROPLASTY Right 02/03/2015   Procedure: RIGHT TOTAL HIP ARTHROPLASTY ANTERIOR APPROACH;  Surgeon: Paralee Cancel, MD;  Location: [facility] ORS;  Service: Orthopedics;  Laterality: Right;    SOCIAL HISTORY: Social History   Tobacco Use  . Smoking status: Former Smoker    Years: 15.00    Types: Cigarettes    Last attempt to quit: 10/31/2010    Years since quitting: 7.1  . Smokeless tobacco: Never Used  Substance Use Topics  . Alcohol use: Yes    Comment: rare  . Drug use: No    FAMILY HISTORY: Family History  Problem Relation Age of Onset  .  Thyroid disease Mother   . Diabetes Father   . Hyperlipidemia Father   . Obesity Father   . Cancer Maternal Grandmother   . Colon cancer Neg Hx     ROS: Review of Systems  Constitutional: Negative for weight loss.  Gastrointestinal: Negative for nausea and vomiting.  Genitourinary: Negative for frequency.  Musculoskeletal:       Negative muscle weakness  Endo/Heme/Allergies: Negative for polydipsia.       Negative polyphagia Negative hypoglycemia    PHYSICAL EXAM: Blood pressure 104/71, pulse 70, temperature 98 F (36.7 C), temperature source Oral, height 5\' 7"  (1.702 m), weight 194 lb (88 kg), SpO2 98 %. Body mass index is 30.38 kg/m. Physical Exam  Constitutional: She is oriented to person, place, and time. She appears well-developed and well-nourished.  Cardiovascular: Normal rate.  Pulmonary/Chest: Effort normal.  Musculoskeletal: Normal range of motion.  Neurological: She is oriented to person, place, and time.  Skin: Skin is warm and dry.  Psychiatric: She has a normal mood and affect. Her behavior is normal.  Vitals reviewed.   RECENT LABS AND TESTS: BMET    Component Value Date/Time   NA 146 (H) 11/22/2017 0925   K 5.0 11/22/2017 0925   CL 105 11/22/2017 0925   CO2 23 11/22/2017 0925   GLUCOSE 95 11/22/2017 0925   GLUCOSE 103 (H) 09/14/2017 1001   BUN 12 11/22/2017 0925   CREATININE 0.95 11/22/2017 0925   CALCIUM 9.3 11/22/2017 0925   GFRNONAA 69 11/22/2017 0925   GFRAA 79 11/22/2017 0925   Lab Results  Component Value Date   HGBA1C 5.8 (H) 11/22/2017   HGBA1C 5.6 08/09/2017   HGBA1C 5.6 05/08/2017   HGBA1C 5.9 (H) 01/02/2017   Lab Results  Component Value Date   INSULIN 12.1 11/22/2017   INSULIN 14.4 08/09/2017   INSULIN 20.1 05/08/2017   INSULIN 25.0 (H) 01/02/2017   CBC    Component Value Date/Time   WBC 5.7 11/22/2017 0925   WBC 6.1 09/14/2017 1015   RBC 4.34 11/22/2017 0925   RBC 4.26 09/14/2017 1015   HGB 11.9 11/22/2017 0925    HCT 38.3 11/22/2017 0925   PLT 198 09/14/2017 1015   MCV 88 11/22/2017 0925   MCH 27.4 11/22/2017 0925   MCH 27.5 09/14/2017 1015   MCHC 31.1 (L) 11/22/2017 0925   MCHC 31.5 09/14/2017 1015   RDW 15.0 11/22/2017 0925   LYMPHSABS 1.5 11/22/2017 0925   EOSABS 0.1 11/22/2017 0925   BASOSABS 0.0 11/22/2017 0925   Iron/TIBC/Ferritin/ %Sat No results found for: IRON, TIBC, FERRITIN, IRONPCTSAT Lipid Panel     Component Value Date/Time   CHOL 192 11/22/2017 0925   TRIG 117 11/22/2017 0925   HDL 65 11/22/2017 0925   LDLCALC 104 (H) 11/22/2017 7425  Hepatic Function Panel     Component Value Date/Time   PROT 7.4 11/22/2017 0925   ALBUMIN 4.5 11/22/2017 0925   AST 10 11/22/2017 0925   ALT 9 11/22/2017 0925   ALKPHOS 66 11/22/2017 0925   BILITOT 0.3 11/22/2017 0925      Component Value Date/Time   TSH 4.280 01/02/2017 1007  Results for CASSIOPEIA, FLORENTINO (MRN [id number]) as of 12/26/2017 16:52  Ref. Range 11/22/2017 09:25  Vitamin D, 25-Hydroxy Latest Ref Range: 30.0 - 100.0 ng/mL 36.3    ASSESSMENT AND PLAN: Prediabetes - Plan: metFORMIN (GLUCOPHAGE) 500 MG tablet  Vitamin D deficiency - Plan: Vitamin D, Ergocalciferol, (DRISDOL) 50000 units CAPS capsule  At risk for diabetes mellitus  Class 1 obesity with serious comorbidity and body mass index (BMI) of 30.0 to 30.9 in adult, unspecified obesity type  PLAN:  Pre-Diabetes Aarvi will continue to work on weight loss, exercise, and decreasing simple carbohydrates in her diet to help decrease the risk of diabetes. We dicussed metformin including benefits and risks. She was informed that eating too many simple carbohydrates or too many calories at one sitting increases the likelihood of GI side effects. Larine agrees to continue taking metformin 500 mg q AM #30 and we will refill for 1 month. Ashten agrees to follow up with our clinic in 3 weeks as directed to monitor her progress.  Diabetes risk counselling Cherrie  was given extended (15 minutes) diabetes prevention counseling today. She is 52 y.o. female and has risk factors for diabetes including obesity and pre-diabetes. We discussed intensive lifestyle modifications today with an emphasis on weight loss as well as increasing exercise and decreasing simple carbohydrates in her diet.  Vitamin D Deficiency Dymin was informed that low vitamin D levels contributes to fatigue and are associated with obesity, breast, and colon cancer. Janeal agrees to continue taking prescription Vit D @50 ,000 IU every week #4 and we will refill for 1 month. She will follow up for routine testing of vitamin D, at least 2-3 times per year. She was informed of the risk of over-replacement of vitamin D and agrees to not increase her dose unless she discusses this with [place] first. Lether agrees to follow up with our clinic in 3 weeks.  Obesity Tashona is currently in the action stage of change. As such, her goal is to continue with weight loss efforts She has agreed to portion control better and make smarter food choices, such as increase vegetables and decrease simple carbohydrates  Dalina has been instructed to work up to a goal of 150 minutes of combined cardio and strengthening exercise per week for weight loss and overall health benefits. We discussed the following Behavioral Modification Strategies today: increasing lean protein intake and work on meal planning and easy cooking plans   Arianna has agreed to follow up with our clinic in 3 weeks. She was informed of the importance of frequent follow up visits to maximize her success with intensive lifestyle modifications for her multiple health conditions.   OBESITY BEHAVIORAL INTERVENTION VISIT  Today's visit was # 22 out of 22.  Starting weight: 216 lbs Starting date: 01/02/17 Today's weight : 194 lbs  Today's date: 12/26/2017 Total lbs lost to date: 31 (Patients must lose 7 lbs in the first 6 months to  continue with counseling)   ASK: We discussed the diagnosis of obesity with Yevette Edwards today and Anely agreed to give [place] permission to discuss obesity behavioral modification therapy today.  ASSESS: Colletta [place]  has the diagnosis of obesity and her BMI today is 30.38 Terasa is in the action stage of change   ADVISE: Wilhelmine was educated on the multiple health risks of obesity as well as the benefit of weight loss to improve her health. She was advised of the need for long term treatment and the importance of lifestyle modifications.  AGREE: Multiple dietary modification options and treatment options were discussed and  Lorraine agreed to the above obesity treatment plan.   Wilhemena Durie, am acting as transcriptionist for Lacy Duverney, PA-C I, Lacy Duverney [facility], have reviewed this note and agree with its content

## 2017-12-27 MED FILL — PROGESTERONE MICRONIZED 100: 100 | 30 days supply | Qty: 30 | Fill #6

## 2018-01-08 MED FILL — ESTRADIOL 0.075 MG/24HR PTT: 0.075 | 28 days supply | Qty: 8 | Fill #4

## 2018-01-17 ENCOUNTER — Ambulatory Visit (INDEPENDENT_AMBULATORY_CARE_PROVIDER_SITE_OTHER): Payer: 59 | Admitting: Physician Assistant

## 2018-01-17 VITALS — BP 105/72 | HR 67 | Temp 97.8°F | Ht 67.0 in | Wt 193.0 lb

## 2018-01-17 DIAGNOSIS — E559 Vitamin D deficiency, unspecified: Secondary | ICD-10-CM

## 2018-01-17 DIAGNOSIS — E669 Obesity, unspecified: Secondary | ICD-10-CM | POA: Diagnosis not present

## 2018-01-17 DIAGNOSIS — R7303 Prediabetes: Secondary | ICD-10-CM | POA: Diagnosis not present

## 2018-01-17 DIAGNOSIS — Z6830 Body mass index (BMI) 30.0-30.9, adult: Secondary | ICD-10-CM

## 2018-01-17 DIAGNOSIS — Z9189 Other specified personal risk factors, not elsewhere classified: Secondary | ICD-10-CM | POA: Diagnosis not present

## 2018-01-17 MED ORDER — METFORMIN HCL 500 MG PO TABS: 500.0000 mg | ORAL_TABLET | Freq: Every morning | ORAL | 0 refills | Status: DC

## 2018-01-17 NOTE — Progress Notes (Signed)
Office: [phone]  /  Fax: [phone]   HPI:   Chief Complaint: OBESITY Whitney Davenport is here to discuss her progress with her obesity treatment plan. She is on the portion control better and make smarter food choices, such as increase vegetables and decrease simple carbohydrates  and is following her eating plan approximately 60 % of the time. She states she is doing the treadmill for 30 minutes 1 times per week. Whitney Davenport has been sick with an upper respiratory infection and has not been eating well. She feels a little better and is motivated to get back on track.  Her weight is 193 lb (87.5 kg) today and has had a weight loss of 1 pound over a period of 3 weeks since her last visit. She has lost 23 lbs since starting treatment with [place].  Pre-Diabetes Whitney Davenport has a diagnosis of pre-diabetes based on her elevated Hgb A1c and was informed this puts her at greater risk of developing diabetes. She is taking metformin currently and continues to work on diet and exercise to decrease risk of diabetes. She denies polyphagia, nausea or hypoglycemia.  At risk for diabetes Whitney Davenport is at higher than average risk for developing diabetes due to her obesity and pre-diabetes. She currently denies polyuria or polydipsia.  Vitamin D deficiency Whitney Davenport has a diagnosis of vitamin D deficiency. She is currently taking prescription Vit D and denies nausea, vomiting or muscle weakness.  ALLERGIES: Allergies  Allergen Reactions  . Sulfa Antibiotics Nausea And Vomiting and Other (See Comments)    Causes headache with nausea  . Hydrocodone Itching and Rash  . Zithromax [Azithromycin] Itching and Rash    MEDICATIONS: Current Outpatient Medications on File Prior to Visit  Medication Sig Dispense Refill  . cetirizine (ZYRTEC) 10 MG tablet Take 10 mg by mouth at bedtime.     . clonazePAM (KLONOPIN) 0.5 MG tablet Take 0.5 mg by mouth 2 (two) times daily as needed for anxiety.     Marland Kitchen estradiol  (VIVELLE-DOT) 0.075 MG/24HR Place 1 patch onto the skin 2 (two) times a week.    . metFORMIN (GLUCOPHAGE) 500 MG tablet Take 1 tablet (500 mg total) by mouth every morning. 30 tablet 0  . progesterone (PROMETRIUM) 100 MG capsule Take 100 mg by mouth at bedtime.    . sertraline (ZOLOFT) 100 MG tablet Take 100 mg by mouth at bedtime.     . simvastatin (ZOCOR) 40 MG tablet Take 40 mg by mouth at bedtime.     . Vitamin D, Ergocalciferol, (DRISDOL) 50000 units CAPS capsule Take 1 capsule (50,000 Units total) by mouth every 7 (seven) days. 4 capsule 0   No current facility-administered medications on file prior to visit.     PAST MEDICAL HISTORY: Past Medical History:  Diagnosis Date  . Allergy   . Anemia    hx of  . Anxiety   . Back injury    "ruptured disc-no problems since surgery"  . Back pain   . Constipation   . Depression   . GERD (gastroesophageal reflux disease)    occ  . Hepatitis 2004   hx of hep c-"interferon treatment, does not show up on blood test anymore"  . History of kidney stones   . Hyperlipidemia   . Hypotension    no fainting in years  . Infertility, female   . Lumbar disc disease   . Osteoarthritis   . Vitamin D deficiency     PAST SURGICAL HISTORY: Past Surgical History:  Procedure  Laterality Date  . DIAGNOSTIC LAPAROSCOPY  last 2001   for endometriosis, couple of surgeries  . GANGLION CYST EXCISION  age 16  . LUMBAR LAMINECTOMY/DECOMPRESSION MICRODISCECTOMY  11/03/2011   Procedure: LUMBAR LAMINECTOMY/DECOMPRESSION MICRODISCECTOMY;  Surgeon: Hosie Spangle, MD;  Location: [place] [facility];  Service: Neurosurgery;  Laterality: Left;  LEFT Lumbar five sacral one laminotomy and microdiskectomy  . TOTAL HIP ARTHROPLASTY Right 02/03/2015   Procedure: RIGHT TOTAL HIP ARTHROPLASTY ANTERIOR APPROACH;  Surgeon: Paralee Cancel, MD;  Location: [facility];  Service: Orthopedics;  Laterality: Right;    SOCIAL HISTORY: Social History   Tobacco Use  . Smoking status:  Former Smoker    Years: 15.00    Types: Cigarettes    Last attempt to quit: 10/31/2010    Years since quitting: 7.2  . Smokeless tobacco: Never Used  Substance Use Topics  . Alcohol use: Yes    Comment: rare  . Drug use: No    FAMILY HISTORY: Family History  Problem Relation Age of Onset  . Thyroid disease Mother   . Diabetes Father   . Hyperlipidemia Father   . Obesity Father   . Cancer Maternal Grandmother   . Colon cancer Neg Hx     ROS: Review of Systems  Constitutional: Positive for weight loss.  Gastrointestinal: Negative for nausea and vomiting.  Genitourinary: Negative for frequency.  Musculoskeletal:       Negative muscle weakness  Endo/Heme/Allergies: Negative for polydipsia.       Negative polyphagia Negative hypiglycemia    PHYSICAL EXAM: Blood pressure 105/72, pulse 67, temperature 97.8 F (36.6 C), temperature source Oral, height 5\' 7"  (1.702 m), weight 193 lb (87.5 kg), SpO2 97 %. Body mass index is 30.23 kg/m. Physical Exam  Constitutional: She is oriented to person, place, and time. She appears well-developed and well-nourished.  Cardiovascular: Normal rate.  Pulmonary/Chest: Effort normal.  Musculoskeletal: Normal range of motion.  Neurological: She is oriented to person, place, and time.  Skin: Skin is warm and dry.  Psychiatric: She has a normal mood and affect. Her behavior is normal.  Vitals reviewed.   RECENT LABS AND TESTS: BMET    Component Value Date/Time   NA 146 (H) 11/22/2017 0925   K 5.0 11/22/2017 0925   CL 105 11/22/2017 0925   CO2 23 11/22/2017 0925   GLUCOSE 95 11/22/2017 0925   GLUCOSE 103 (H) 09/14/2017 1001   BUN 12 11/22/2017 0925   CREATININE 0.95 11/22/2017 0925   CALCIUM 9.3 11/22/2017 0925   GFRNONAA 69 11/22/2017 0925   GFRAA 79 11/22/2017 0925   Lab Results  Component Value Date   HGBA1C 5.8 (H) 11/22/2017   HGBA1C 5.6 08/09/2017   HGBA1C 5.6 05/08/2017   HGBA1C 5.9 (H) 01/02/2017   Lab Results    Component Value Date   INSULIN 12.1 11/22/2017   INSULIN 14.4 08/09/2017   INSULIN 20.1 05/08/2017   INSULIN 25.0 (H) 01/02/2017   CBC    Component Value Date/Time   WBC 5.7 11/22/2017 0925   WBC 6.1 09/14/2017 1015   RBC 4.34 11/22/2017 0925   RBC 4.26 09/14/2017 1015   HGB 11.9 11/22/2017 0925   HCT 38.3 11/22/2017 0925   PLT 198 09/14/2017 1015   MCV 88 11/22/2017 0925   MCH 27.4 11/22/2017 0925   MCH 27.5 09/14/2017 1015   MCHC 31.1 (L) 11/22/2017 0925   MCHC 31.5 09/14/2017 1015   RDW 15.0 11/22/2017 0925   LYMPHSABS 1.5 11/22/2017 0925   EOSABS  0.1 11/22/2017 0925   BASOSABS 0.0 11/22/2017 0925   Iron/TIBC/Ferritin/ %Sat No results found for: IRON, TIBC, FERRITIN, IRONPCTSAT Lipid Panel     Component Value Date/Time   CHOL 192 11/22/2017 0925   TRIG 117 11/22/2017 0925   HDL 65 11/22/2017 0925   LDLCALC 104 (H) 11/22/2017 0925   Hepatic Function Panel     Component Value Date/Time   PROT 7.4 11/22/2017 0925   ALBUMIN 4.5 11/22/2017 0925   AST 10 11/22/2017 0925   ALT 9 11/22/2017 0925   ALKPHOS 66 11/22/2017 0925   BILITOT 0.3 11/22/2017 0925      Component Value Date/Time   TSH 4.280 01/02/2017 1007  Results for LAKYIA, BEHE (MRN [id number]) as of 01/17/2018 09:09  Ref. Range 11/22/2017 09:25  Vitamin D, 25-Hydroxy Latest Ref Range: 30.0 - 100.0 ng/mL 36.3    ASSESSMENT AND PLAN: Prediabetes - Plan: metFORMIN (GLUCOPHAGE) 500 MG tablet  Vitamin D deficiency  At risk for diabetes mellitus  Class 1 obesity with serious comorbidity and body mass index (BMI) of 30.0 to 30.9 in adult, unspecified obesity type  PLAN:  Pre-Diabetes Whitney Davenport will continue to work on weight loss, exercise, and decreasing simple carbohydrates in her diet to help decrease the risk of diabetes. We dicussed metformin including benefits and risks. She was informed that eating too many simple carbohydrates or too many calories at one sitting increases the likelihood of GI  side effects. Whitney Davenport agrees to continue taking metformin 500 mg q AM #30 and we will refill for 1 month. Whitney Davenport agrees to follow up with our clinic in 2 weeks as directed to monitor her progress.  Diabetes risk counselling Whitney Davenport was given extended (15 minutes) diabetes prevention counseling today. She is 52 y.o. female and has risk factors for diabetes including obesity and pre-diabetes. We discussed intensive lifestyle modifications today with an emphasis on weight loss as well as increasing exercise and decreasing simple carbohydrates in her diet.  Vitamin D Deficiency Whitney Davenport was informed that low vitamin D levels contributes to fatigue and are associated with obesity, breast, and colon cancer. Whitney Davenport agrees to continue taking prescription Vit D @50 ,000 IU every week and will follow up for routine testing of vitamin D, at least 2-3 times per year. She was informed of the risk of over-replacement of vitamin D and agrees to not increase her dose unless she discusses this with [place] first. Whitney Davenport agrees to follow up with our clinic in 2 weeks.  Obesity Whitney Davenport is currently in the action stage of change. As such, her goal is to continue with weight loss efforts She has agreed to portion control better and make smarter food choices, such as increase vegetables and decrease simple carbohydrates  Whitney Davenport has been instructed to work up to a goal of 150 minutes of combined cardio and strengthening exercise per week for weight loss and overall health benefits. We discussed the following Behavioral Modification Strategies today: increasing lean protein intake and work on meal planning and easy cooking plans   Whitney Davenport has agreed to follow up with our clinic in 2 weeks. She was informed of the importance of frequent follow up visits to maximize her success with intensive lifestyle modifications for her multiple health conditions.   Wilhemena Durie, am acting as transcriptionist for  Lacy Duverney, PA-C I, Lacy Duverney [facility], have reviewed this note and agree with its content

## 2018-01-19 MED FILL — metFORMIN HCL 500 MG TABS: 500 | 30 days supply | Qty: 30 | Fill #0

## 2018-01-23 ENCOUNTER — Other Ambulatory Visit (INDEPENDENT_AMBULATORY_CARE_PROVIDER_SITE_OTHER): Payer: Self-pay | Admitting: Physician Assistant

## 2018-01-23 DIAGNOSIS — E559 Vitamin D deficiency, unspecified: Secondary | ICD-10-CM

## 2018-01-29 MED FILL — PROGESTERONE MICRONIZED 100: 100 | 30 days supply | Qty: 30 | Fill #7 | Status: TO

## 2018-02-01 ENCOUNTER — Ambulatory Visit (INDEPENDENT_AMBULATORY_CARE_PROVIDER_SITE_OTHER): Payer: 59 | Admitting: Physician Assistant

## 2018-02-01 VITALS — BP 108/73 | HR 74 | Temp 97.7°F | Ht 67.0 in | Wt 192.0 lb

## 2018-02-01 DIAGNOSIS — Z9189 Other specified personal risk factors, not elsewhere classified: Secondary | ICD-10-CM | POA: Diagnosis not present

## 2018-02-01 DIAGNOSIS — E669 Obesity, unspecified: Secondary | ICD-10-CM | POA: Diagnosis not present

## 2018-02-01 DIAGNOSIS — E559 Vitamin D deficiency, unspecified: Secondary | ICD-10-CM | POA: Diagnosis not present

## 2018-02-01 DIAGNOSIS — Z6830 Body mass index (BMI) 30.0-30.9, adult: Secondary | ICD-10-CM

## 2018-02-01 DIAGNOSIS — R7303 Prediabetes: Secondary | ICD-10-CM

## 2018-02-01 MED ORDER — VITAMIN D (ERGOCALCIFEROL) 1.25 MG (50000 UNIT) PO CAPS: 50000.0000 [IU] | ORAL_CAPSULE | ORAL | 0 refills | Status: DC

## 2018-02-01 MED FILL — VIT D2 1.25 MG (50,000 UNIT: 1.25 MG | 28 days supply | Qty: 4 | Fill #0

## 2018-02-01 NOTE — Progress Notes (Signed)
Office: [phone]  /  Fax: [phone]   HPI:   Chief Complaint: OBESITY Whitney Davenport is here to discuss her progress with her obesity treatment plan. She is on the portion control better and make smarter food choices, such as increase vegetables and decrease simple carbohydrates  and is following her eating plan approximately 70 % of the time. She states she is walking for 20 minutes 1 times per week. Whitney Davenport continues to well with weight loss. She is incorporating low carbohydrates and high protein recipes.  Her weight is 192 lb (87.1 kg) today and has had a weight loss of 1 pounds over a period of 2 weeks since her last visit. She has lost 24 lbs since starting treatment with [place].  Vitamin D Deficiency Whitney Davenport has a diagnosis of vitamin D deficiency. She is currently taking prescription Vit D and denies nausea, vomiting or muscle weakness.  At risk for osteopenia and osteoporosis Whitney Davenport is at higher risk of osteopenia and osteoporosis due to vitamin D deficiency.   Pre-Diabetes Whitney Davenport has a diagnosis of pre-diabetes based on her elevated Hgb A1c and was informed this puts her at greater risk of developing diabetes. She is taking metformin currently and continues to work on diet and exercise to decrease risk of diabetes. She denies polyphagia, nausea or hypoglycemia.  ALLERGIES: Allergies  Allergen Reactions  . Sulfa Antibiotics Nausea And Vomiting and Other (See Comments)    Causes headache with nausea  . Hydrocodone Itching and Rash  . Zithromax [Azithromycin] Itching and Rash    MEDICATIONS: Current Outpatient Medications on File Prior to Visit  Medication Sig Dispense Refill  . cetirizine (ZYRTEC) 10 MG tablet Take 10 mg by mouth at bedtime.     . clonazePAM (KLONOPIN) 0.5 MG tablet Take 0.5 mg by mouth 2 (two) times daily as needed for anxiety.     Marland Kitchen estradiol (VIVELLE-DOT) 0.075 MG/24HR Place 1 patch onto the skin 2 (two) times a week.    . metFORMIN  (GLUCOPHAGE) 500 MG tablet Take 1 tablet (500 mg total) by mouth every morning. 30 tablet 0  . progesterone (PROMETRIUM) 100 MG capsule Take 100 mg by mouth at bedtime.    . sertraline (ZOLOFT) 100 MG tablet Take 100 mg by mouth at bedtime.     . simvastatin (ZOCOR) 40 MG tablet Take 40 mg by mouth at bedtime.      No current facility-administered medications on file prior to visit.     PAST MEDICAL HISTORY: Past Medical History:  Diagnosis Date  . Allergy   . Anemia    hx of  . Anxiety   . Back injury    "ruptured disc-no problems since surgery"  . Back pain   . Constipation   . Depression   . GERD (gastroesophageal reflux disease)    occ  . Hepatitis 2004   hx of hep c-"interferon treatment, does not show up on blood test anymore"  . History of kidney stones   . Hyperlipidemia   . Hypotension    no fainting in years  . Infertility, female   . Lumbar disc disease   . Osteoarthritis   . Vitamin D deficiency     PAST SURGICAL HISTORY: Past Surgical History:  Procedure Laterality Date  . DIAGNOSTIC LAPAROSCOPY  last 2001   for endometriosis, couple of surgeries  . GANGLION CYST EXCISION  age 38  . LUMBAR LAMINECTOMY/DECOMPRESSION MICRODISCECTOMY  11/03/2011   Procedure: LUMBAR LAMINECTOMY/DECOMPRESSION MICRODISCECTOMY;  Surgeon: Hosie Spangle, MD;  Location: [place] [facility];  Service: Neurosurgery;  Laterality: Left;  LEFT Lumbar five sacral one laminotomy and microdiskectomy  . TOTAL HIP ARTHROPLASTY Right 02/03/2015   Procedure: RIGHT TOTAL HIP ARTHROPLASTY ANTERIOR APPROACH;  Surgeon: Paralee Cancel, MD;  Location: [facility];  Service: Orthopedics;  Laterality: Right;    SOCIAL HISTORY: Social History   Tobacco Use  . Smoking status: Former Smoker    Years: 15.00    Types: Cigarettes    Last attempt to quit: 10/31/2010    Years since quitting: 7.2  . Smokeless tobacco: Never Used  Substance Use Topics  . Alcohol use: Yes    Comment: rare  . Drug use: No     FAMILY HISTORY: Family History  Problem Relation Age of Onset  . Thyroid disease Mother   . Diabetes Father   . Hyperlipidemia Father   . Obesity Father   . Cancer Maternal Grandmother   . Colon cancer Neg Hx     ROS: Review of Systems  Constitutional: Positive for weight loss.  Gastrointestinal: Negative for nausea and vomiting.  Musculoskeletal:       Negative muscle weakness  Endo/Heme/Allergies:       Negative polyphagia Negative hypoglycemia    PHYSICAL EXAM: Blood pressure 108/73, pulse 74, temperature 97.7 F (36.5 C), height 5\' 7"  (1.702 m), weight 192 lb (87.1 kg), SpO2 99 %. Body mass index is 30.07 kg/m. Physical Exam  Constitutional: She is oriented to person, place, and time. She appears well-developed and well-nourished.  Cardiovascular: Normal rate.  Pulmonary/Chest: Effort normal.  Musculoskeletal: Normal range of motion.  Neurological: She is oriented to person, place, and time.  Skin: Skin is warm and dry.  Psychiatric: She has a normal mood and affect. Her behavior is normal.  Vitals reviewed.   RECENT LABS AND TESTS: BMET    Component Value Date/Time   NA 146 (H) 11/22/2017 0925   K 5.0 11/22/2017 0925   CL 105 11/22/2017 0925   CO2 23 11/22/2017 0925   GLUCOSE 95 11/22/2017 0925   GLUCOSE 103 (H) 09/14/2017 1001   BUN 12 11/22/2017 0925   CREATININE 0.95 11/22/2017 0925   CALCIUM 9.3 11/22/2017 0925   GFRNONAA 69 11/22/2017 0925   GFRAA 79 11/22/2017 0925   Lab Results  Component Value Date   HGBA1C 5.8 (H) 11/22/2017   HGBA1C 5.6 08/09/2017   HGBA1C 5.6 05/08/2017   HGBA1C 5.9 (H) 01/02/2017   Lab Results  Component Value Date   INSULIN 12.1 11/22/2017   INSULIN 14.4 08/09/2017   INSULIN 20.1 05/08/2017   INSULIN 25.0 (H) 01/02/2017   CBC    Component Value Date/Time   WBC 5.7 11/22/2017 0925   WBC 6.1 09/14/2017 1015   RBC 4.34 11/22/2017 0925   RBC 4.26 09/14/2017 1015   HGB 11.9 11/22/2017 0925   HCT 38.3  11/22/2017 0925   PLT 198 09/14/2017 1015   MCV 88 11/22/2017 0925   MCH 27.4 11/22/2017 0925   MCH 27.5 09/14/2017 1015   MCHC 31.1 (L) 11/22/2017 0925   MCHC 31.5 09/14/2017 1015   RDW 15.0 11/22/2017 0925   LYMPHSABS 1.5 11/22/2017 0925   EOSABS 0.1 11/22/2017 0925   BASOSABS 0.0 11/22/2017 0925   Iron/TIBC/Ferritin/ %Sat No results found for: IRON, TIBC, FERRITIN, IRONPCTSAT Lipid Panel     Component Value Date/Time   CHOL 192 11/22/2017 0925   TRIG 117 11/22/2017 0925   HDL 65 11/22/2017 0925   LDLCALC 104 (H) 11/22/2017 1660  Hepatic Function Panel     Component Value Date/Time   PROT 7.4 11/22/2017 0925   ALBUMIN 4.5 11/22/2017 0925   AST 10 11/22/2017 0925   ALT 9 11/22/2017 0925   ALKPHOS 66 11/22/2017 0925   BILITOT 0.3 11/22/2017 0925      Component Value Date/Time   TSH 4.280 01/02/2017 1007  Results for HELLEN, SHANLEY (MRN [id number]) as of 02/01/2018 14:58  Ref. Range 11/22/2017 09:25  Vitamin D, 25-Hydroxy Latest Ref Range: 30.0 - 100.0 ng/mL 36.3    ASSESSMENT AND PLAN: Vitamin D deficiency - Plan: Vitamin D, Ergocalciferol, (DRISDOL) 50000 units CAPS capsule  Prediabetes  At risk for osteoporosis  Class 1 obesity with serious comorbidity and body mass index (BMI) of 30.0 to 30.9 in adult, unspecified obesity type  PLAN:  Vitamin D Deficiency Whitney Davenport was informed that low vitamin D levels contributes to fatigue and are associated with obesity, breast, and colon cancer. Whitney Davenport agrees to continue taking prescription Vit D @50 ,000 IU every week #4 and we will refill for 1 month. She will follow up for routine testing of vitamin D, at least 2-3 times per year. She was informed of the risk of over-replacement of vitamin D and agrees to not increase her dose unless she discusses this with [place] first. Whitney Davenport agrees to follow up with our clinic in 2 weeks.  At risk for osteopenia and osteoporosis Whitney Davenport is at risk for osteopenia and osteoporsis  due to her vitamin D deficiency. She was encouraged to take her vitamin D and follow her higher calcium diet and increase strengthening exercise to help strengthen her bones and decrease her risk of osteopenia and osteoporosis.  Pre-Diabetes Whitney Davenport will continue to work on weight loss, exercise, and decreasing simple carbohydrates in her diet to help decrease the risk of diabetes. We dicussed metformin including benefits and risks. She was informed that eating too many simple carbohydrates or too many calories at one sitting increases the likelihood of GI side effects. Whitney Davenport agrees to continue taking metformin and Whitney Davenport agreed to follow up with our clinic in 2 weeks as directed to monitor her progress.  Obesity Whitney Davenport is currently in the action stage of change. As such, her goal is to continue with weight loss efforts She has agreed to follow the Category 2 plan Whitney Davenport has been instructed to work up to a goal of 150 minutes of combined cardio and strengthening exercise per week for weight loss and overall health benefits. We discussed the following Behavioral Modification Strategies today: increasing lean protein intake and work on meal planning and easy cooking plans   Illyana has agreed to follow up with our clinic in 2 weeks. She was informed of the importance of frequent follow up visits to maximize her success with intensive lifestyle modifications for her multiple health conditions.   Wilhemena Durie, am acting as transcriptionist for Lacy Duverney, PA-C I, Lacy Duverney [facility], have reviewed this note and agree with its content

## 2018-02-07 MED FILL — ESTRADIOL 0.075 MG/24HR PTT: 0.075 | 28 days supply | Qty: 8 | Fill #5 | Status: TO

## 2018-02-19 ENCOUNTER — Ambulatory Visit (INDEPENDENT_AMBULATORY_CARE_PROVIDER_SITE_OTHER): Payer: 59 | Admitting: Physician Assistant

## 2018-02-22 ENCOUNTER — Ambulatory Visit (INDEPENDENT_AMBULATORY_CARE_PROVIDER_SITE_OTHER): Payer: 59 | Admitting: Family Medicine

## 2018-02-22 VITALS — BP 124/78 | HR 79 | Temp 98.0°F | Ht 67.0 in | Wt 192.0 lb

## 2018-02-22 DIAGNOSIS — Z6830 Body mass index (BMI) 30.0-30.9, adult: Secondary | ICD-10-CM

## 2018-02-22 DIAGNOSIS — E669 Obesity, unspecified: Secondary | ICD-10-CM

## 2018-02-22 DIAGNOSIS — R7303 Prediabetes: Secondary | ICD-10-CM | POA: Diagnosis not present

## 2018-02-22 DIAGNOSIS — E559 Vitamin D deficiency, unspecified: Secondary | ICD-10-CM

## 2018-02-26 NOTE — Progress Notes (Signed)
Office: [phone]  /  Fax: [phone]   HPI:   Chief Complaint: OBESITY Whitney Davenport is here to discuss her progress with her obesity treatment plan. She is on the Category 2 plan and is following her eating plan approximately 60 % of the time. She states she is walking for 15 minutes 2 times per week. Whitney Davenport took a few shortcuts in meals, didn't eat all protein or fruit at lunch. Hasn't taken in much water either.  Her weight is 192 lb (87.1 kg) today and has not lost weight since her last visit. She has lost 24 lbs since starting treatment with [place].  Vitamin D Deficiency Whitney Davenport has a diagnosis of vitamin D deficiency. She is currently taking prescription Vit D and denies nausea, vomiting or muscle weakness.  Pre-Diabetes Whitney Davenport has a diagnosis of pre-diabetes based on her elevated Hgb A1c and was informed this puts her at greater risk of developing diabetes. She is taking metformin currently and continues to work on diet and exercise to decrease risk of diabetes. She notes occasional carbohydrate cravings. She denies nausea or hypoglycemia.  ALLERGIES: Allergies  Allergen Reactions  . Sulfa Antibiotics Nausea And Vomiting and Other (See Comments)    Causes headache with nausea  . Hydrocodone Itching and Rash  . Zithromax [Azithromycin] Itching and Rash    MEDICATIONS: Current Outpatient Medications on File Prior to Visit  Medication Sig Dispense Refill  . cetirizine (ZYRTEC) 10 MG tablet Take 10 mg by mouth at bedtime.     . clonazePAM (KLONOPIN) 0.5 MG tablet Take 0.5 mg by mouth 2 (two) times daily as needed for anxiety.     Marland Kitchen estradiol (VIVELLE-DOT) 0.075 MG/24HR Place 1 patch onto the skin 2 (two) times a week.    . metFORMIN (GLUCOPHAGE) 500 MG tablet Take 1 tablet (500 mg total) by mouth every morning. 30 tablet 0  . progesterone (PROMETRIUM) 100 MG capsule Take 100 mg by mouth at bedtime.    . sertraline (ZOLOFT) 100 MG tablet Take 100 mg by mouth at bedtime.      . simvastatin (ZOCOR) 40 MG tablet Take 40 mg by mouth at bedtime.     . Vitamin D, Ergocalciferol, (DRISDOL) 50000 units CAPS capsule Take 1 capsule (50,000 Units total) by mouth every 7 (seven) days. 4 capsule 0   No current facility-administered medications on file prior to visit.     PAST MEDICAL HISTORY: Past Medical History:  Diagnosis Date  . Allergy   . Anemia    hx of  . Anxiety   . Back injury    "ruptured disc-no problems since surgery"  . Back pain   . Constipation   . Depression   . GERD (gastroesophageal reflux disease)    occ  . Hepatitis 2004   hx of hep c-"interferon treatment, does not show up on blood test anymore"  . History of kidney stones   . Hyperlipidemia   . Hypotension    no fainting in years  . Infertility, female   . Lumbar disc disease   . Osteoarthritis   . Vitamin D deficiency     PAST SURGICAL HISTORY: Past Surgical History:  Procedure Laterality Date  . DIAGNOSTIC LAPAROSCOPY  last 2001   for endometriosis, couple of surgeries  . GANGLION CYST EXCISION  age 31  . LUMBAR LAMINECTOMY/DECOMPRESSION MICRODISCECTOMY  11/03/2011   Procedure: LUMBAR LAMINECTOMY/DECOMPRESSION MICRODISCECTOMY;  Surgeon: Hosie Spangle, MD;  Location: [facility];  Service: Neurosurgery;  Laterality: Left;  LEFT  Lumbar five sacral one laminotomy and microdiskectomy  . TOTAL HIP ARTHROPLASTY Right 02/03/2015   Procedure: RIGHT TOTAL HIP ARTHROPLASTY ANTERIOR APPROACH;  Surgeon: Paralee Cancel, MD;  Location: [facility];  Service: Orthopedics;  Laterality: Right;    SOCIAL HISTORY: Social History   Tobacco Use  . Smoking status: Former Smoker    Years: 15.00    Types: Cigarettes    Last attempt to quit: 10/31/2010    Years since quitting: 7.3  . Smokeless tobacco: Never Used  Substance Use Topics  . Alcohol use: Yes    Comment: rare  . Drug use: No    FAMILY HISTORY: Family History  Problem Relation Age of Onset  . Thyroid disease Mother   . Diabetes  Father   . Hyperlipidemia Father   . Obesity Father   . Cancer Maternal Grandmother   . Colon cancer Neg Hx     ROS: Review of Systems  Constitutional: Negative for weight loss.  Gastrointestinal: Negative for nausea and vomiting.  Musculoskeletal:       Negative muscle weakness  Endo/Heme/Allergies:       Negative hypoglycemia    PHYSICAL EXAM: Blood pressure 124/78, pulse 79, temperature 98 F (36.7 C), temperature source Oral, height 5\' 7"  (1.702 m), weight 192 lb (87.1 kg), SpO2 98 %. Body mass index is 30.07 kg/m. Physical Exam  Constitutional: She is oriented to person, place, and time. She appears well-developed and well-nourished.  Cardiovascular: Normal rate.  Pulmonary/Chest: Effort normal.  Musculoskeletal: Normal range of motion.  Neurological: She is oriented to person, place, and time.  Skin: Skin is warm and dry.  Psychiatric: She has a normal mood and affect. Her behavior is normal.  Vitals reviewed.   RECENT LABS AND TESTS: BMET    Component Value Date/Time   NA 146 (H) 11/22/2017 0925   K 5.0 11/22/2017 0925   CL 105 11/22/2017 0925   CO2 23 11/22/2017 0925   GLUCOSE 95 11/22/2017 0925   GLUCOSE 103 (H) 09/14/2017 1001   BUN 12 11/22/2017 0925   CREATININE 0.95 11/22/2017 0925   CALCIUM 9.3 11/22/2017 0925   GFRNONAA 69 11/22/2017 0925   GFRAA 79 11/22/2017 0925   Lab Results  Component Value Date   HGBA1C 5.8 (H) 11/22/2017   HGBA1C 5.6 08/09/2017   HGBA1C 5.6 05/08/2017   HGBA1C 5.9 (H) 01/02/2017   Lab Results  Component Value Date   INSULIN 12.1 11/22/2017   INSULIN 14.4 08/09/2017   INSULIN 20.1 05/08/2017   INSULIN 25.0 (H) 01/02/2017   CBC    Component Value Date/Time   WBC 5.7 11/22/2017 0925   WBC 6.1 09/14/2017 1015   RBC 4.34 11/22/2017 0925   RBC 4.26 09/14/2017 1015   HGB 11.9 11/22/2017 0925   HCT 38.3 11/22/2017 0925   PLT 198 09/14/2017 1015   MCV 88 11/22/2017 0925   MCH 27.4 11/22/2017 0925   MCH 27.5  09/14/2017 1015   MCHC 31.1 (L) 11/22/2017 0925   MCHC 31.5 09/14/2017 1015   RDW 15.0 11/22/2017 0925   LYMPHSABS 1.5 11/22/2017 0925   EOSABS 0.1 11/22/2017 0925   BASOSABS 0.0 11/22/2017 0925   Iron/TIBC/Ferritin/ %Sat No results found for: IRON, TIBC, FERRITIN, IRONPCTSAT Lipid Panel     Component Value Date/Time   CHOL 192 11/22/2017 0925   TRIG 117 11/22/2017 0925   HDL 65 11/22/2017 0925   LDLCALC 104 (H) 11/22/2017 0925   Hepatic Function Panel     Component Value Date/Time  PROT 7.4 11/22/2017 0925   ALBUMIN 4.5 11/22/2017 0925   AST 10 11/22/2017 0925   ALT 9 11/22/2017 0925   ALKPHOS 66 11/22/2017 0925   BILITOT 0.3 11/22/2017 0925      Component Value Date/Time   TSH 4.280 01/02/2017 1007  Results for KADASIA, KASSING (MRN [id number]) as of 02/26/2018 11:19  Ref. Range 11/22/2017 09:25  Vitamin D, 25-Hydroxy Latest Ref Range: 30.0 - 100.0 ng/mL 36.3    ASSESSMENT AND PLAN: Vitamin D deficiency  Prediabetes  Class 1 obesity with serious comorbidity and body mass index (BMI) of 30.0 to 30.9 in adult, unspecified obesity type  PLAN:  Vitamin D Deficiency Whitney Davenport was informed that low vitamin D levels contributes to fatigue and are associated with obesity, breast, and colon cancer. Whitney Davenport agrees to continue taking prescription Vit D @50 ,000 IU every week, no refill needed. She will follow up for routine testing of vitamin D, at least 2-3 times per year. She was informed of the risk of over-replacement of vitamin D and agrees to not increase her dose unless she discusses this with [place] first. We will recheck labs at next visit and Whitney Davenport agrees to follow up with our clinic in 2 weeks.  Pre-Diabetes Whitney Davenport will continue to work on weight loss, exercise, and decreasing simple carbohydrates in her diet to help decrease the risk of diabetes. We dicussed metformin including benefits and risks. She was informed that eating too many simple carbohydrates or too  many calories at one sitting increases the likelihood of GI side effects. Whitney Davenport agrees to continue taking metformin and we will recheck labs at next visit. Whitney Davenport agrees to follow up with our clinic in 2 weeks as directed to monitor her progress.  We spent > than 50% of the 15 minute visit on the counseling as documented in the note.  Obesity Whitney Davenport is currently in the action stage of change. As such, her goal is to continue with weight loss efforts She has agreed to follow the Category 2 plan Whitney Davenport has been instructed to work up to a goal of 150 minutes of combined cardio and strengthening exercise per week for weight loss and overall health benefits. We discussed the following Behavioral Modification Strategies today: increasing lean protein intake, work on meal planning and easy cooking plans, increase H20 intake, keeping healthy foods in the home, better snacking choices, and planning for success   Whitney Davenport has agreed to follow up with our clinic in 2 weeks. She was informed of the importance of frequent follow up visits to maximize her success with intensive lifestyle modifications for her multiple health conditions.   I, Trixie Dredge, am acting as transcriptionist for Ilene Qua, MD  I have reviewed the above documentation for accuracy and completeness, and I agree with the above. - Ilene Qua, MD

## 2018-02-27 MED FILL — SERTRALINE HCL 100 MG TAB: 100 | 90 days supply | Qty: 90 | Fill #0

## 2018-02-27 MED FILL — PROGESTERONE 100 MG CAPSULE: 100 | 30 days supply | Qty: 30 | Fill #0 | Status: TO

## 2018-03-05 MED FILL — ESTRADIOL 0.075 MG/24HR PTT: 0.075 | 28 days supply | Qty: 8 | Fill #0

## 2018-03-05 MED FILL — clonazePAM 0.5 MG TABS: 0.5 | 15 days supply | Qty: 15 | Fill #0

## 2018-03-14 ENCOUNTER — Ambulatory Visit (INDEPENDENT_AMBULATORY_CARE_PROVIDER_SITE_OTHER): Payer: 59 | Admitting: Family Medicine

## 2018-03-15 MED FILL — SIMVASTATIN 40 MG TABLET: 40 | 90 days supply | Qty: 90 | Fill #0

## 2018-04-02 ENCOUNTER — Ambulatory Visit (INDEPENDENT_AMBULATORY_CARE_PROVIDER_SITE_OTHER): Payer: 59 | Admitting: Physician Assistant

## 2018-04-02 VITALS — BP 114/79 | HR 68 | Temp 97.9°F | Ht 67.0 in | Wt 191.0 lb

## 2018-04-02 DIAGNOSIS — Z6830 Body mass index (BMI) 30.0-30.9, adult: Secondary | ICD-10-CM

## 2018-04-02 DIAGNOSIS — Z9189 Other specified personal risk factors, not elsewhere classified: Secondary | ICD-10-CM

## 2018-04-02 DIAGNOSIS — E7849 Other hyperlipidemia: Secondary | ICD-10-CM

## 2018-04-02 DIAGNOSIS — E559 Vitamin D deficiency, unspecified: Secondary | ICD-10-CM

## 2018-04-02 DIAGNOSIS — E669 Obesity, unspecified: Secondary | ICD-10-CM | POA: Diagnosis not present

## 2018-04-02 DIAGNOSIS — R7303 Prediabetes: Secondary | ICD-10-CM | POA: Diagnosis not present

## 2018-04-02 MED ORDER — METFORMIN HCL 500 MG PO TABS: 500.0000 mg | ORAL_TABLET | Freq: Every morning | ORAL | 0 refills | Status: DC

## 2018-04-02 MED ORDER — VITAMIN D (ERGOCALCIFEROL) 1.25 MG (50000 UNIT) PO CAPS: 50000.0000 [IU] | ORAL_CAPSULE | ORAL | 0 refills | Status: DC

## 2018-04-02 MED FILL — metFORMIN HCL 500 MG TABS: 500 | 30 days supply | Qty: 30 | Fill #0

## 2018-04-02 MED FILL — VIT D2 1.25 MG (50,000 UNIT: 1.25 MG | 28 days supply | Qty: 4 | Fill #0

## 2018-04-02 MED FILL — PROGESTERONE 100 MG CAPSULE: 100 | 30 days supply | Qty: 30 | Fill #0 | Status: TO

## 2018-04-02 NOTE — Progress Notes (Signed)
Office: [phone]  /  Fax: [phone]   HPI:   Chief Complaint: OBESITY Whitney Davenport is here to discuss her progress with her obesity treatment plan. She is on the Category 2 plan and is following her eating plan approximately 50 % of the time. She states she is walking for 20 minutes 5 times per week. Whitney Davenport continues to do well with weight loss. She is not following a structured meal plan but is making mindful food choices and controls her portions.  Her weight is 191 lb (86.6 kg) today and has had a weight loss of 1 pounds over a period of 5 to 6 weeks since her last visit. She has lost 25 lbs since starting treatment with [place].  Pre-Diabetes Whitney Davenport has a diagnosis of pre-diabetes based on her elevated Hgb A1c and was informed this puts her at greater risk of developing diabetes. She is taking metformin currently and continues to work on diet and exercise to decrease risk of diabetes. She denies polyphagia, nausea, or hypoglycemia.  At risk for diabetes Whitney Davenport is at higher than average risk for developing diabetes due to her obesity and pre-diabetes. She currently denies polyuria or polydipsia.  Hyperlipidemia Whitney Davenport has hyperlipidemia and has been trying to improve her cholesterol levels with intensive lifestyle modification including a low saturated fat diet, exercise and weight loss. She denies any chest pain, claudication or myalgias. She is not on medications and declines.  Vitamin D Deficiency Whitney Davenport has a diagnosis of vitamin D deficiency. She is currently taking prescription Vit D and denies nausea, vomiting or muscle weakness.  ALLERGIES: Allergies  Allergen Reactions  . Sulfa Antibiotics Nausea And Vomiting and Other (See Comments)    Causes headache with nausea  . Hydrocodone Itching and Rash  . Zithromax [Azithromycin] Itching and Rash    MEDICATIONS: Current Outpatient Medications on File Prior to Visit  Medication Sig Dispense Refill  . cetirizine  (ZYRTEC) 10 MG tablet Take 10 mg by mouth at bedtime.     . clonazePAM (KLONOPIN) 0.5 MG tablet Take 0.5 mg by mouth 2 (two) times daily as needed for anxiety.     Marland Kitchen estradiol (VIVELLE-DOT) 0.075 MG/24HR Place 1 patch onto the skin 2 (two) times a week.    . progesterone (PROMETRIUM) 100 MG capsule Take 100 mg by mouth at bedtime.    . sertraline (ZOLOFT) 100 MG tablet Take 100 mg by mouth at bedtime.     . simvastatin (ZOCOR) 40 MG tablet Take 40 mg by mouth at bedtime.      No current facility-administered medications on file prior to visit.     PAST MEDICAL HISTORY: Past Medical History:  Diagnosis Date  . Allergy   . Anemia    hx of  . Anxiety   . Back injury    "ruptured disc-no problems since surgery"  . Back pain   . Constipation   . Depression   . GERD (gastroesophageal reflux disease)    occ  . Hepatitis 2004   hx of hep c-"interferon treatment, does not show up on blood test anymore"  . History of kidney stones   . Hyperlipidemia   . Hypotension    no fainting in years  . Infertility, female   . Lumbar disc disease   . Osteoarthritis   . Vitamin D deficiency     PAST SURGICAL HISTORY: Past Surgical History:  Procedure Laterality Date  . DIAGNOSTIC LAPAROSCOPY  last 2001   for endometriosis, couple of surgeries  .  GANGLION CYST EXCISION  age 10  . LUMBAR LAMINECTOMY/DECOMPRESSION MICRODISCECTOMY  11/03/2011   Procedure: LUMBAR LAMINECTOMY/DECOMPRESSION MICRODISCECTOMY;  Surgeon: Hosie Spangle, MD;  Location: [facility];  Service: Neurosurgery;  Laterality: Left;  LEFT Lumbar five sacral one laminotomy and microdiskectomy  . TOTAL HIP ARTHROPLASTY Right 02/03/2015   Procedure: RIGHT TOTAL HIP ARTHROPLASTY ANTERIOR APPROACH;  Surgeon: Paralee Cancel, MD;  Location: [facility];  Service: Orthopedics;  Laterality: Right;    SOCIAL HISTORY: Social History   Tobacco Use  . Smoking status: Former Smoker    Years: 15.00    Types: Cigarettes    Last attempt to  quit: 10/31/2010    Years since quitting: 7.4  . Smokeless tobacco: Never Used  Substance Use Topics  . Alcohol use: Yes    Comment: rare  . Drug use: No    FAMILY HISTORY: Family History  Problem Relation Age of Onset  . Thyroid disease Mother   . Diabetes Father   . Hyperlipidemia Father   . Obesity Father   . Cancer Maternal Grandmother   . Colon cancer Neg Hx     ROS: Review of Systems  Constitutional: Positive for weight loss.  Cardiovascular: Negative for chest pain and claudication.  Gastrointestinal: Negative for nausea and vomiting.  Genitourinary: Negative for frequency.  Musculoskeletal: Negative for myalgias.       Negative muscle weakness  Endo/Heme/Allergies: Negative for polydipsia.       Negative polyphagia Negative hypoglycemia    PHYSICAL EXAM: Blood pressure 114/79, pulse 68, temperature 97.9 F (36.6 C), temperature source Oral, height 5\' 7"  (1.702 m), weight 191 lb (86.6 kg), SpO2 99 %. Body mass index is 29.91 kg/m. Physical Exam  Constitutional: She is oriented to person, place, and time. She appears well-developed and well-nourished.  Cardiovascular: Normal rate.  Pulmonary/Chest: Effort normal.  Musculoskeletal: Normal range of motion.  Neurological: She is oriented to person, place, and time.  Skin: Skin is warm and dry.  Psychiatric: She has a normal mood and affect. Her behavior is normal.  Vitals reviewed.   RECENT LABS AND TESTS: BMET    Component Value Date/Time   NA 146 (H) 11/22/2017 0925   K 5.0 11/22/2017 0925   CL 105 11/22/2017 0925   CO2 23 11/22/2017 0925   GLUCOSE 95 11/22/2017 0925   GLUCOSE 103 (H) 09/14/2017 1001   BUN 12 11/22/2017 0925   CREATININE 0.95 11/22/2017 0925   CALCIUM 9.3 11/22/2017 0925   GFRNONAA 69 11/22/2017 0925   GFRAA 79 11/22/2017 0925   Lab Results  Component Value Date   HGBA1C 5.8 (H) 11/22/2017   HGBA1C 5.6 08/09/2017   HGBA1C 5.6 05/08/2017   HGBA1C 5.9 (H) 01/02/2017   Lab  Results  Component Value Date   INSULIN 12.1 11/22/2017   INSULIN 14.4 08/09/2017   INSULIN 20.1 05/08/2017   INSULIN 25.0 (H) 01/02/2017   CBC    Component Value Date/Time   WBC 5.7 11/22/2017 0925   WBC 6.1 09/14/2017 1015   RBC 4.34 11/22/2017 0925   RBC 4.26 09/14/2017 1015   HGB 11.9 11/22/2017 0925   HCT 38.3 11/22/2017 0925   PLT 198 09/14/2017 1015   MCV 88 11/22/2017 0925   MCH 27.4 11/22/2017 0925   MCH 27.5 09/14/2017 1015   MCHC 31.1 (L) 11/22/2017 0925   MCHC 31.5 09/14/2017 1015   RDW 15.0 11/22/2017 0925   LYMPHSABS 1.5 11/22/2017 0925   EOSABS 0.1 11/22/2017 0925   BASOSABS 0.0 11/22/2017  0925   Iron/TIBC/Ferritin/ %Sat No results found for: IRON, TIBC, FERRITIN, IRONPCTSAT Lipid Panel     Component Value Date/Time   CHOL 192 11/22/2017 0925   TRIG 117 11/22/2017 0925   HDL 65 11/22/2017 0925   LDLCALC 104 (H) 11/22/2017 0925   Hepatic Function Panel     Component Value Date/Time   PROT 7.4 11/22/2017 0925   ALBUMIN 4.5 11/22/2017 0925   AST 10 11/22/2017 0925   ALT 9 11/22/2017 0925   ALKPHOS 66 11/22/2017 0925   BILITOT 0.3 11/22/2017 0925      Component Value Date/Time   TSH 4.280 01/02/2017 1007  Results for IRASEMA, CHALK (MRN [id number]) as of 04/02/2018 16:42  Ref. Range 11/22/2017 09:25  Vitamin D, 25-Hydroxy Latest Ref Range: 30.0 - 100.0 ng/mL 36.3    ASSESSMENT AND PLAN: Prediabetes - Plan: Comprehensive metabolic panel, Hemoglobin A1c, Insulin, random, metFORMIN (GLUCOPHAGE) 500 MG tablet  Other hyperlipidemia - Plan: Lipid Panel With LDL/HDL Ratio  Vitamin D deficiency - Plan: VITAMIN D 25 Hydroxy (Vit-D Deficiency, Fractures), Vitamin D, Ergocalciferol, (DRISDOL) 50000 units CAPS capsule  At risk for diabetes mellitus  Class 1 obesity with serious comorbidity and body mass index (BMI) of 30.0 to 30.9 in adult, unspecified obesity type - Starting BMI greater then 30  PLAN:  Pre-Diabetes Whitney Davenport will continue to work  on weight loss, exercise, and decreasing simple carbohydrates in her diet to help decrease the risk of diabetes. We dicussed metformin including benefits and risks. She was informed that eating too many simple carbohydrates or too many calories at one sitting increases the likelihood of GI side effects. Donzella agrees to continue taking metformin 500 mg q AM #30 and we will refill for 1 month. We will check labs and Whitney Davenport agrees to follow up with our clinic in 3 weeks as directed to monitor her progress.  Diabetes risk counselling Whitney Davenport was given extended (15 minutes) diabetes prevention counseling today. She is 52 y.o. female and has risk factors for diabetes including obesity and pre-diabetes. We discussed intensive lifestyle modifications today with an emphasis on weight loss as well as increasing exercise and decreasing simple carbohydrates in her diet.  Hyperlipidemia Whitney Davenport was informed of the American Heart Association Guidelines emphasizing intensive lifestyle modifications as the first line treatment for hyperlipidemia. We discussed many lifestyle modifications today in depth, and Whitney Davenport will continue to work on decreasing saturated fats such as fatty red meat, butter and many fried foods. She will also increase vegetables and lean protein in her diet and continue to work on diet, exercise, and weight loss efforts. We will check labs and Whitney Davenport agrees to follow up with our clinic in 3 weeks.  Vitamin D Deficiency Whitney Davenport was informed that low vitamin D levels contributes to fatigue and are associated with obesity, breast, and colon cancer. Whitney Davenport agrees to continue taking prescription Vit D @50 ,000 IU every week #4 and we will refill for 1 month. She will follow up for routine testing of vitamin D, at least 2-3 times per year. She was informed of the risk of over-replacement of vitamin D and agrees to not increase her dose unless she discusses this with [place] first. We will  check labs and Whitney Davenport agrees to follow up with our clinic in 3 weeks.  Obesity Whitney Davenport is currently in the action stage of change. As such, her goal is to continue with weight loss efforts She has agreed to portion control better and make smarter food choices, such as  increase vegetables and decrease simple carbohydrates  Whitney Davenport has been instructed to work up to a goal of 150 minutes of combined cardio and strengthening exercise per week for weight loss and overall health benefits. We discussed the following Behavioral Modification Strategies today: increasing lean protein intake and work on meal planning and easy cooking plans   Whitney Davenport has agreed to follow up with our clinic in 3 weeks. She was informed of the importance of frequent follow up visits to maximize her success with intensive lifestyle modifications for her multiple health conditions.   Whitney Davenport, am acting as transcriptionist for Lacy Duverney, PA-C I, Lacy Duverney [facility], have reviewed this note and agree with its content

## 2018-04-03 LAB — COMPREHENSIVE METABOLIC PANEL
A/G RATIO: 1.6 (ref 1.2–2.2)
ALBUMIN: 4.4 g/dL (ref 3.5–5.5)
ALK PHOS: 80 IU/L (ref 39–117)
ALT: 10 IU/L (ref 0–32)
AST: 11 IU/L (ref 0–40)
BUN / CREAT RATIO: 12 (ref 9–23)
BUN: 12 mg/dL (ref 6–24)
CO2: 26 mmol/L (ref 20–29)
CREATININE: 0.97 mg/dL (ref 0.57–1.00)
Calcium: 9.7 mg/dL (ref 8.7–10.2)
Chloride: 103 mmol/L (ref 96–106)
GFR calc Af Amer: 77 mL/min/{1.73_m2} (ref >59)
GFR calc non Af Amer: 67 mL/min/{1.73_m2} (ref >59)
GLOBULIN, TOTAL: 2.8 g/dL (ref 1.5–4.5)
Glucose: 99 mg/dL (ref 65–99)
POTASSIUM: 5.1 mmol/L (ref 3.5–5.2)
SODIUM: 143 mmol/L (ref 134–144)
Total Protein: 7.2 g/dL (ref 6.0–8.5)

## 2018-04-03 LAB — INSULIN, RANDOM: INSULIN: 15.5 u[IU]/mL (ref 2.6–24.9)

## 2018-04-03 LAB — LIPID PANEL WITH LDL/HDL RATIO
Cholesterol, Total: 161 mg/dL (ref 100–199)
HDL: 65 mg/dL (ref >39)
LDL CALC: 78 mg/dL (ref 0–99)
LDL/HDL RATIO: 1.2 ratio (ref 0.0–3.2)
TRIGLYCERIDES: 88 mg/dL (ref 0–149)
VLDL Cholesterol Cal: 18 mg/dL (ref 5–40)

## 2018-04-03 LAB — HEMOGLOBIN A1C
Est. average glucose Bld gHb Est-mCnc: 120 mg/dL
HEMOGLOBIN A1C: 5.8 % — AB (ref 4.8–5.6)

## 2018-04-03 LAB — VITAMIN D 25 HYDROXY (VIT D DEFICIENCY, FRACTURES): Vit D, 25-Hydroxy: 44.8 ng/mL (ref 30.0–100.0)

## 2018-04-05 MED FILL — ESTRADIOL 0.075 MG/24HR PTT: 0.075 | 28 days supply | Qty: 8 | Fill #1

## 2018-04-06 DIAGNOSIS — F4322 Adjustment disorder with anxiety: Secondary | ICD-10-CM | POA: Diagnosis not present

## 2018-04-12 DIAGNOSIS — F419 Anxiety disorder, unspecified: Secondary | ICD-10-CM | POA: Diagnosis not present

## 2018-04-12 DIAGNOSIS — R002 Palpitations: Secondary | ICD-10-CM | POA: Diagnosis not present

## 2018-04-12 DIAGNOSIS — F4322 Adjustment disorder with anxiety: Secondary | ICD-10-CM | POA: Diagnosis not present

## 2018-04-13 MED FILL — clonazePAM 0.5 MG TABS: 0.5 | 30 days supply | Qty: 30 | Fill #0

## 2018-04-23 ENCOUNTER — Ambulatory Visit (INDEPENDENT_AMBULATORY_CARE_PROVIDER_SITE_OTHER): Payer: 59 | Admitting: Physician Assistant

## 2018-04-23 VITALS — BP 106/70 | HR 72 | Temp 98.2°F | Ht 67.0 in | Wt 193.0 lb

## 2018-04-23 DIAGNOSIS — Z9189 Other specified personal risk factors, not elsewhere classified: Secondary | ICD-10-CM | POA: Diagnosis not present

## 2018-04-23 DIAGNOSIS — Z6830 Body mass index (BMI) 30.0-30.9, adult: Secondary | ICD-10-CM | POA: Diagnosis not present

## 2018-04-23 DIAGNOSIS — E669 Obesity, unspecified: Secondary | ICD-10-CM

## 2018-04-23 DIAGNOSIS — R7303 Prediabetes: Secondary | ICD-10-CM

## 2018-04-23 DIAGNOSIS — E559 Vitamin D deficiency, unspecified: Secondary | ICD-10-CM

## 2018-04-23 DIAGNOSIS — E66811 Obesity, class 1: Secondary | ICD-10-CM

## 2018-04-23 MED ORDER — METFORMIN HCL 500 MG PO TABS: 500.0000 mg | ORAL_TABLET | Freq: Every morning | ORAL | 0 refills | Status: DC

## 2018-04-23 MED ORDER — VITAMIN D (ERGOCALCIFEROL) 1.25 MG (50000 UNIT) PO CAPS: 50000.0000 [IU] | ORAL_CAPSULE | ORAL | 0 refills | Status: DC

## 2018-04-23 NOTE — Progress Notes (Signed)
Office: [phone]  /  Fax: [phone]   HPI:   Chief Complaint: OBESITY Whitney Davenport is here to discuss her progress with her obesity treatment plan. She is on the portion control better and make smarter food choices, such as increase vegetables and decrease simple carbohydrates and is following her eating plan approximately 50 % of the time. She states she is walking for 20 minutes 3 times per week. Whitney Davenport was on vacation and has not not been as mindful of her eating. She is motivated to get back on track and continue with weight loss.  Her weight is 193 lb (87.5 kg) today and has gained 2 pounds since her last visit. She has lost 33 lbs since starting treatment with [place].  Pre-Diabetes Whitney Davenport has a diagnosis of pre-diabetes based on her elevated Hgb A1c and was informed this puts her at greater risk of developing diabetes. She is taking metformin currently and continues to work on diet and exercise to decrease risk of diabetes. She denies polyphagia, nausea, or hypoglycemia.  At risk for diabetes Whitney Davenport is at higher than average risk for developing diabetes due to her obesity and pre-diabetes. She currently denies polyuria or polydipsia.  Vitamin D Deficiency Whitney Davenport has a diagnosis of vitamin D deficiency. She is currently taking prescription Vit D and denies nausea, vomiting or muscle weakness.  ALLERGIES: Allergies  Allergen Reactions  . Sulfa Antibiotics Nausea And Vomiting and Other (See Comments)    Causes headache with nausea  . Hydrocodone Itching and Rash  . Zithromax [Azithromycin] Itching and Rash    MEDICATIONS: Current Outpatient Medications on File Prior to Visit  Medication Sig Dispense Refill  . cetirizine (ZYRTEC) 10 MG tablet Take 10 mg by mouth at bedtime.     . clonazePAM (KLONOPIN) 0.5 MG tablet Take 0.5 mg by mouth 2 (two) times daily as needed for anxiety.     Marland Kitchen estradiol (VIVELLE-DOT) 0.075 MG/24HR Place 1 patch onto the skin 2 (two) times a  week.    . metFORMIN (GLUCOPHAGE) 500 MG tablet Take 1 tablet (500 mg total) by mouth every morning. 30 tablet 0  . progesterone (PROMETRIUM) 100 MG capsule Take 100 mg by mouth at bedtime.    . sertraline (ZOLOFT) 100 MG tablet Take 100 mg by mouth at bedtime.     . simvastatin (ZOCOR) 40 MG tablet Take 40 mg by mouth at bedtime.     . Vitamin D, Ergocalciferol, (DRISDOL) 50000 units CAPS capsule Take 1 capsule (50,000 Units total) by mouth every 7 (seven) days. 4 capsule 0   No current facility-administered medications on file prior to visit.     PAST MEDICAL HISTORY: Past Medical History:  Diagnosis Date  . Allergy   . Anemia    hx of  . Anxiety   . Back injury    "ruptured disc-no problems since surgery"  . Back pain   . Constipation   . Depression   . GERD (gastroesophageal reflux disease)    occ  . Hepatitis 2004   hx of hep c-"interferon treatment, does not show up on blood test anymore"  . History of kidney stones   . Hyperlipidemia   . Hypotension    no fainting in years  . Infertility, female   . Lumbar disc disease   . Osteoarthritis   . Vitamin D deficiency     PAST SURGICAL HISTORY: Past Surgical History:  Procedure Laterality Date  . DIAGNOSTIC LAPAROSCOPY  last 2001   for endometriosis, couple  of surgeries  . GANGLION CYST EXCISION  age 63  . LUMBAR LAMINECTOMY/DECOMPRESSION MICRODISCECTOMY  11/03/2011   Procedure: LUMBAR LAMINECTOMY/DECOMPRESSION MICRODISCECTOMY;  Surgeon: Hosie Spangle, MD;  Location: [place];  Service: Neurosurgery;  Laterality: Left;  LEFT Lumbar five sacral one laminotomy and microdiskectomy  . TOTAL HIP ARTHROPLASTY Right 02/03/2015   Procedure: RIGHT TOTAL HIP ARTHROPLASTY ANTERIOR APPROACH;  Surgeon: Paralee Cancel, MD;  Location: [facility];  Service: Orthopedics;  Laterality: Right;    SOCIAL HISTORY: Social History   Tobacco Use  . Smoking status: Former Smoker    Years: 15.00    Types: Cigarettes    Last attempt to  quit: 10/31/2010    Years since quitting: 7.4  . Smokeless tobacco: Never Used  Substance Use Topics  . Alcohol use: Yes    Comment: rare  . Drug use: No    FAMILY HISTORY: Family History  Problem Relation Age of Onset  . Thyroid disease Mother   . Diabetes Father   . Hyperlipidemia Father   . Obesity Father   . Cancer Maternal Grandmother   . Colon cancer Neg Hx     ROS: Review of Systems  Constitutional: Negative for weight loss.  Gastrointestinal: Negative for nausea and vomiting.  Genitourinary: Negative for frequency.  Musculoskeletal:       Negative muscle weakness  Endo/Heme/Allergies: Negative for polydipsia.       Negative polyphagia Negative hypoglycemia    PHYSICAL EXAM: Blood pressure 106/70, pulse 72, temperature 98.2 F (36.8 C), temperature source Oral, height 5\' 7"  (1.702 m), weight 193 lb (87.5 kg), SpO2 98 %. Body mass index is 30.23 kg/m. Physical Exam  Constitutional: She is oriented to person, place, and time. She appears well-developed and well-nourished.  Cardiovascular: Normal rate.  Pulmonary/Chest: Effort normal.  Musculoskeletal: Normal range of motion.  Neurological: She is oriented to person, place, and time.  Skin: Skin is warm and dry.  Psychiatric: She has a normal mood and affect. Her behavior is normal.  Vitals reviewed.   RECENT LABS AND TESTS: BMET    Component Value Date/Time   NA 143 04/02/2018 0927   K 5.1 04/02/2018 0927   CL 103 04/02/2018 0927   CO2 26 04/02/2018 0927   GLUCOSE 99 04/02/2018 0927   GLUCOSE 103 (H) 09/14/2017 1001   BUN 12 04/02/2018 0927   CREATININE 0.97 04/02/2018 0927   CALCIUM 9.7 04/02/2018 0927   GFRNONAA 67 04/02/2018 0927   GFRAA 77 04/02/2018 0927   Lab Results  Component Value Date   HGBA1C 5.8 (H) 04/02/2018   HGBA1C 5.8 (H) 11/22/2017   HGBA1C 5.6 08/09/2017   HGBA1C 5.6 05/08/2017   HGBA1C 5.9 (H) 01/02/2017   Lab Results  Component Value Date   INSULIN 15.5 04/02/2018    INSULIN 12.1 11/22/2017   INSULIN 14.4 08/09/2017   INSULIN 20.1 05/08/2017   INSULIN 25.0 (H) 01/02/2017   CBC    Component Value Date/Time   WBC 5.7 11/22/2017 0925   WBC 6.1 09/14/2017 1015   RBC 4.34 11/22/2017 0925   RBC 4.26 09/14/2017 1015   HGB 11.9 11/22/2017 0925   HCT 38.3 11/22/2017 0925   PLT 198 09/14/2017 1015   MCV 88 11/22/2017 0925   MCH 27.4 11/22/2017 0925   MCH 27.5 09/14/2017 1015   MCHC 31.1 (L) 11/22/2017 0925   MCHC 31.5 09/14/2017 1015   RDW 15.0 11/22/2017 0925   LYMPHSABS 1.5 11/22/2017 0925   EOSABS 0.1 11/22/2017 0925  BASOSABS 0.0 11/22/2017 0925   Iron/TIBC/Ferritin/ %Sat No results found for: IRON, TIBC, FERRITIN, IRONPCTSAT Lipid Panel     Component Value Date/Time   CHOL 161 04/02/2018 0927   TRIG 88 04/02/2018 0927   HDL 65 04/02/2018 0927   LDLCALC 78 04/02/2018 0927   Hepatic Function Panel     Component Value Date/Time   PROT 7.2 04/02/2018 0927   ALBUMIN 4.4 04/02/2018 0927   AST 11 04/02/2018 0927   ALT 10 04/02/2018 0927   ALKPHOS 80 04/02/2018 0927   BILITOT <0.2 04/02/2018 0927      Component Value Date/Time   TSH 4.280 01/02/2017 1007  Results for SHANELE, NISSAN (MRN [id number]) as of 04/23/2018 11:19  Ref. Range 04/02/2018 09:27  Vitamin D, 25-Hydroxy Latest Ref Range: 30.0 - 100.0 ng/mL 44.8    ASSESSMENT AND PLAN: Prediabetes - Plan: metFORMIN (GLUCOPHAGE) 500 MG tablet  Vitamin D deficiency - Plan: Vitamin D, Ergocalciferol, (DRISDOL) 50000 units CAPS capsule  At risk for diabetes mellitus  Class 1 obesity with serious comorbidity and body mass index (BMI) of 30.0 to 30.9 in adult, unspecified obesity type  PLAN:  Pre-Diabetes Whitney Davenport will continue to work on weight loss, exercise, and decreasing simple carbohydrates in her diet to help decrease the risk of diabetes. We dicussed metformin including benefits and risks. She was informed that eating too many simple carbohydrates or too many calories at  one sitting increases the likelihood of GI side effects. Whitney Davenport agrees to continue taking metformin 500 mg q AM #30 and we will refill for 1 month. Whitney Davenport agrees to follow up with our clinic in 2 to 3 weeks as directed to monitor her progress.  Diabetes risk counselling Whitney Davenport was given extended (15 minutes) diabetes prevention counseling today. She is 52 y.o. female and has risk factors for diabetes including obesity and pre-diabetes. We discussed intensive lifestyle modifications today with an emphasis on weight loss as well as increasing exercise and decreasing simple carbohydrates in her diet.  Vitamin D Deficiency Whitney Davenport was informed that low vitamin D levels contributes to fatigue and are associated with obesity, breast, and colon cancer. Whitney Davenport agrees to continue taking prescription Vit D @50 ,000 IU every week #4 and we will refill for 1 month. She will follow up for routine testing of vitamin D, at least 2-3 times per year. She was informed of the risk of over-replacement of vitamin D and agrees to not increase her dose unless she discusses this with [place] first. Whitney Davenport agrees to follow up with our clinic in 2 to 3 weeks.  Obesity Whitney Davenport is currently in the action stage of change. As such, her goal is to get back to weightloss efforts  She has agreed to follow the Category 2 plan Whitney Davenport has been instructed to work up to a goal of 150 minutes of combined cardio and strengthening exercise per week for weight loss and overall health benefits. We discussed the following Behavioral Modification Strategies today: celebration eating strategies    Whitney Davenport has agreed to follow up with our clinic in 2 to 3 weeks. She was informed of the importance of frequent follow up visits to maximize her success with intensive lifestyle modifications for her multiple health conditions.   Whitney Davenport, am acting as transcriptionist for Lacy Duverney, PA-C I, Lacy Duverney [facility], have  reviewed this note and agree with its content

## 2018-04-27 DIAGNOSIS — F329 Major depressive disorder, single episode, unspecified: Secondary | ICD-10-CM | POA: Diagnosis not present

## 2018-04-27 DIAGNOSIS — F4322 Adjustment disorder with anxiety: Secondary | ICD-10-CM | POA: Diagnosis not present

## 2018-05-01 MED FILL — PROGESTERONE 100 MG CAPSULE: 100 | 30 days supply | Qty: 30 | Fill #0

## 2018-05-01 MED FILL — ESTRADIOL 0.075 MG/24HR PTT: 0.075 | 28 days supply | Qty: 8 | Fill #2

## 2018-05-02 MED FILL — VIT D2 1.25 MG (50,000 UNIT: 1.25 MG | 28 days supply | Qty: 4 | Fill #0

## 2018-05-14 ENCOUNTER — Ambulatory Visit (INDEPENDENT_AMBULATORY_CARE_PROVIDER_SITE_OTHER): Payer: 59 | Admitting: Family Medicine

## 2018-05-14 VITALS — BP 111/76 | HR 68 | Temp 97.8°F | Ht 67.0 in | Wt 191.0 lb

## 2018-05-14 DIAGNOSIS — Z6830 Body mass index (BMI) 30.0-30.9, adult: Secondary | ICD-10-CM

## 2018-05-14 DIAGNOSIS — E669 Obesity, unspecified: Secondary | ICD-10-CM | POA: Diagnosis not present

## 2018-05-14 DIAGNOSIS — E559 Vitamin D deficiency, unspecified: Secondary | ICD-10-CM | POA: Diagnosis not present

## 2018-05-14 DIAGNOSIS — R7303 Prediabetes: Secondary | ICD-10-CM | POA: Diagnosis not present

## 2018-05-14 DIAGNOSIS — E66811 Obesity, class 1: Secondary | ICD-10-CM

## 2018-05-15 MED FILL — SERTRALINE HCL 100 MG TAB: 100 | 90 days supply | Qty: 135 | Fill #0

## 2018-05-15 MED FILL — metFORMIN HCL 500 MG TABS: 500 | 30 days supply | Qty: 30 | Fill #0

## 2018-05-15 NOTE — Progress Notes (Signed)
Office: [phone]  /  Fax: [phone]   HPI:   Chief Complaint: OBESITY Whitney Davenport is here to discuss her progress with her obesity treatment plan. She is on the Category 2 plan and is following her eating plan approximately 40 % of the time. She states she is walking 20 minutes 2 times per week. Whitney Davenport has been trying to make better choices in terms of intake, but she is not following the plan specifically. Her weight is 191 lb (86.6 kg) today and has had a weight loss of 2 pounds over a period of 3 weeks since her last visit. She has lost 25 lbs since starting treatment with [place].  Pre-Diabetes Whitney Davenport has a diagnosis of prediabetes based on her elevated Hgb A1c and was informed this puts her at greater risk of developing diabetes. She  Has no GI side effects on metformin. Whitney Davenport continues to work on diet and exercise to decrease risk of diabetes. She admits to occasional carb cravings and denies nausea or hypoglycemia.  Vitamin D deficiency Whitney Davenport has a diagnosis of vitamin D deficiency. She is currently taking vit D and admits fatigue, but denies nausea, vomiting or muscle weakness.  ALLERGIES: Allergies  Allergen Reactions  . Sulfa Antibiotics Nausea And Vomiting and Other (See Comments)    Causes headache with nausea  . Hydrocodone Itching and Rash  . Zithromax [Azithromycin] Itching and Rash    MEDICATIONS: Current Outpatient Medications on File Prior to Visit  Medication Sig Dispense Refill  . cetirizine (ZYRTEC) 10 MG tablet Take 10 mg by mouth at bedtime.     . clonazePAM (KLONOPIN) 0.5 MG tablet Take 0.5 mg by mouth 2 (two) times daily as needed for anxiety.     Marland Kitchen estradiol (VIVELLE-DOT) 0.075 MG/24HR Place 1 patch onto the skin 2 (two) times a week.    . metFORMIN (GLUCOPHAGE) 500 MG tablet Take 1 tablet (500 mg total) by mouth every morning. 30 tablet 0  . progesterone (PROMETRIUM) 100 MG capsule Take 100 mg by mouth at bedtime.    . sertraline (ZOLOFT)  100 MG tablet Take 100 mg by mouth at bedtime.     . simvastatin (ZOCOR) 40 MG tablet Take 40 mg by mouth at bedtime.     . Vitamin D, Ergocalciferol, (DRISDOL) 50000 units CAPS capsule Take 1 capsule (50,000 Units total) by mouth every 7 (seven) days. 4 capsule 0   No current facility-administered medications on file prior to visit.     PAST MEDICAL HISTORY: Past Medical History:  Diagnosis Date  . Allergy   . Anemia    hx of  . Anxiety   . Back injury    "ruptured disc-no problems since surgery"  . Back pain   . Constipation   . Depression   . GERD (gastroesophageal reflux disease)    occ  . Hepatitis 2004   hx of hep c-"interferon treatment, does not show up on blood test anymore"  . History of kidney stones   . Hyperlipidemia   . Hypotension    no fainting in years  . Infertility, female   . Lumbar disc disease   . Osteoarthritis   . Vitamin D deficiency     PAST SURGICAL HISTORY: Past Surgical History:  Procedure Laterality Date  . DIAGNOSTIC LAPAROSCOPY  last 2001   for endometriosis, couple of surgeries  . GANGLION CYST EXCISION  age 66  . LUMBAR LAMINECTOMY/DECOMPRESSION MICRODISCECTOMY  11/03/2011   Procedure: LUMBAR LAMINECTOMY/DECOMPRESSION MICRODISCECTOMY;  Surgeon: Hosie Spangle,  MD;  Location: [place] [facility];  Service: Neurosurgery;  Laterality: Left;  LEFT Lumbar five sacral one laminotomy and microdiskectomy  . TOTAL HIP ARTHROPLASTY Right 02/03/2015   Procedure: RIGHT TOTAL HIP ARTHROPLASTY ANTERIOR APPROACH;  Surgeon: Paralee Cancel, MD;  Location: [facility];  Service: Orthopedics;  Laterality: Right;    SOCIAL HISTORY: Social History   Tobacco Use  . Smoking status: Former Smoker    Years: 15.00    Types: Cigarettes    Last attempt to quit: 10/31/2010    Years since quitting: 7.5  . Smokeless tobacco: Never Used  Substance Use Topics  . Alcohol use: Yes    Comment: rare  . Drug use: No    FAMILY HISTORY: Family History  Problem Relation Age  of Onset  . Thyroid disease Mother   . Diabetes Father   . Hyperlipidemia Father   . Obesity Father   . Cancer Maternal Grandmother   . Colon cancer Neg Hx     ROS: Review of Systems  Constitutional: Positive for malaise/fatigue and weight loss.  Gastrointestinal: Negative for nausea and vomiting.  Musculoskeletal:       Negative for muscle weakness  Endo/Heme/Allergies:       Positive for carb cravings Negative for hypoglycemia    PHYSICAL EXAM: Blood pressure 111/76, pulse 68, temperature 97.8 F (36.6 C), temperature source Oral, height 5\' 7"  (1.702 m), weight 191 lb (86.6 kg), SpO2 98 %. Body mass index is 29.91 kg/m. Physical Exam  Constitutional: She is oriented to person, place, and time. She appears well-developed and well-nourished.  Cardiovascular: Normal rate.  Pulmonary/Chest: Effort normal.  Musculoskeletal: Normal range of motion.  Neurological: She is oriented to person, place, and time.  Skin: Skin is warm and dry.  Psychiatric: She has a normal mood and affect. Her behavior is normal.  Vitals reviewed.   RECENT LABS AND TESTS: BMET    Component Value Date/Time   NA 143 04/02/2018 0927   K 5.1 04/02/2018 0927   CL 103 04/02/2018 0927   CO2 26 04/02/2018 0927   GLUCOSE 99 04/02/2018 0927   GLUCOSE 103 (H) 09/14/2017 1001   BUN 12 04/02/2018 0927   CREATININE 0.97 04/02/2018 0927   CALCIUM 9.7 04/02/2018 0927   GFRNONAA 67 04/02/2018 0927   GFRAA 77 04/02/2018 0927   Lab Results  Component Value Date   HGBA1C 5.8 (H) 04/02/2018   HGBA1C 5.8 (H) 11/22/2017   HGBA1C 5.6 08/09/2017   HGBA1C 5.6 05/08/2017   HGBA1C 5.9 (H) 01/02/2017   Lab Results  Component Value Date   INSULIN 15.5 04/02/2018   INSULIN 12.1 11/22/2017   INSULIN 14.4 08/09/2017   INSULIN 20.1 05/08/2017   INSULIN 25.0 (H) 01/02/2017   CBC    Component Value Date/Time   WBC 5.7 11/22/2017 0925   WBC 6.1 09/14/2017 1015   RBC 4.34 11/22/2017 0925   RBC 4.26 09/14/2017  1015   HGB 11.9 11/22/2017 0925   HCT 38.3 11/22/2017 0925   PLT 198 09/14/2017 1015   MCV 88 11/22/2017 0925   MCH 27.4 11/22/2017 0925   MCH 27.5 09/14/2017 1015   MCHC 31.1 (L) 11/22/2017 0925   MCHC 31.5 09/14/2017 1015   RDW 15.0 11/22/2017 0925   LYMPHSABS 1.5 11/22/2017 0925   EOSABS 0.1 11/22/2017 0925   BASOSABS 0.0 11/22/2017 0925   Iron/TIBC/Ferritin/ %Sat No results found for: IRON, TIBC, FERRITIN, IRONPCTSAT Lipid Panel     Component Value Date/Time   CHOL 161 04/02/2018  0927   TRIG 88 04/02/2018 0927   HDL 65 04/02/2018 0927   LDLCALC 78 04/02/2018 0927   Hepatic Function Panel     Component Value Date/Time   PROT 7.2 04/02/2018 0927   ALBUMIN 4.4 04/02/2018 0927   AST 11 04/02/2018 0927   ALT 10 04/02/2018 0927   ALKPHOS 80 04/02/2018 0927   BILITOT <0.2 04/02/2018 0927      Component Value Date/Time   TSH 4.280 01/02/2017 1007   Results for JANIQUA, FRISCIA (MRN [id number]) as of 05/15/2018 08:26  Ref. Range 04/02/2018 09:27  Vitamin D, 25-Hydroxy Latest Ref Range: 30.0 - 100.0 ng/mL 44.8   ASSESSMENT AND PLAN: Prediabetes  Vitamin D deficiency  Class 1 obesity with serious comorbidity and body mass index (BMI) of 30.0 to 30.9 in adult, unspecified obesity type  PLAN:  Pre-Diabetes Saphronia will continue to work on weight loss, exercise, and decreasing simple carbohydrates in her diet to help decrease the risk of diabetes. We dicussed metformin including benefits and risks. She was informed that eating too many simple carbohydrates or too many calories at one sitting increases the likelihood of GI side effects. Whitney Davenport will continue  metformin for now and a prescription was not written today. Whitney Davenport agreed to follow up with [place] as directed to monitor her progress.  Vitamin D Deficiency Whitney Davenport was informed that low vitamin D levels contributes to fatigue and are associated with obesity, breast, and colon cancer. She agrees to continue to  take prescription Vit D @50 ,000 IU every week and will follow up for routine testing of vitamin D, at least 2-3 times per year. She was informed of the risk of over-replacement of vitamin D and agrees to not increase her dose unless she discusses this with [place] first.  We spent > than 50% of the 15 minute visit on the counseling as documented in the note.  Obesity Whitney Davenport is currently in the action stage of change. As such, her goal is to continue with weight loss efforts She has agreed to portion control better and make smarter food choices, such as increase vegetables and decrease simple carbohydrates and follow the Category 2 plan Dilara has been instructed to work up to a goal of 150 minutes of combined cardio and strengthening exercise per week or resistance band training 2 to 3 times per week for 5 to 7 minutes for weight loss and overall health benefits. We discussed the following Behavioral Modification Strategies today: planning for success, increasing lean protein intake, increasing vegetables and work on meal planning and easy cooking plans  Whitney Davenport has agreed to follow up with our clinic in 2 weeks. She was informed of the importance of frequent follow up visits to maximize her success with intensive lifestyle modifications for her multiple health conditions.  I, Doreene Nest, am acting as transcriptionist for Eber Jones, MD  I have reviewed the above documentation for accuracy and completeness, and I agree with the above. - Ilene Qua, MD

## 2018-05-30 MED FILL — PROGESTERONE 100 MG CAPSULE: 100 | 30 days supply | Qty: 30 | Fill #1

## 2018-05-31 MED FILL — ESTRADIOL 0.075 MG/24HR PTT: 0.075 | 28 days supply | Qty: 8 | Fill #0

## 2018-06-06 ENCOUNTER — Encounter (INDEPENDENT_AMBULATORY_CARE_PROVIDER_SITE_OTHER): Payer: Self-pay | Admitting: Family Medicine

## 2018-06-06 ENCOUNTER — Ambulatory Visit (INDEPENDENT_AMBULATORY_CARE_PROVIDER_SITE_OTHER): Payer: 59 | Admitting: Family Medicine

## 2018-06-06 VITALS — BP 102/72 | HR 70 | Temp 98.3°F | Ht 67.0 in | Wt 192.0 lb

## 2018-06-06 DIAGNOSIS — E559 Vitamin D deficiency, unspecified: Secondary | ICD-10-CM | POA: Diagnosis not present

## 2018-06-06 DIAGNOSIS — E669 Obesity, unspecified: Secondary | ICD-10-CM | POA: Diagnosis not present

## 2018-06-06 DIAGNOSIS — Z6830 Body mass index (BMI) 30.0-30.9, adult: Secondary | ICD-10-CM | POA: Diagnosis not present

## 2018-06-06 DIAGNOSIS — Z9189 Other specified personal risk factors, not elsewhere classified: Secondary | ICD-10-CM

## 2018-06-06 DIAGNOSIS — R7303 Prediabetes: Secondary | ICD-10-CM

## 2018-06-06 MED ORDER — METFORMIN HCL 500 MG PO TABS: 500.0000 mg | ORAL_TABLET | Freq: Every morning | ORAL | 0 refills | Status: DC

## 2018-06-06 MED ORDER — VITAMIN D (ERGOCALCIFEROL) 1.25 MG (50000 UNIT) PO CAPS: 50000.0000 [IU] | ORAL_CAPSULE | ORAL | 0 refills | Status: DC

## 2018-06-06 MED FILL — VIT D2 1.25 MG (50,000 UNIT: 1.25 MG | 28 days supply | Qty: 4 | Fill #0

## 2018-06-06 NOTE — Progress Notes (Signed)
Office: [phone]  /  Fax: [phone]   HPI:   Chief Complaint: OBESITY Whitney Davenport is here to discuss her progress with her obesity treatment plan. She is on the Category 2 plan and is following her eating plan approximately 20 % of the time. She states she is doing resistance bands 10 minutes 2 times per week. Whitney Davenport returned from a trip to [place] and she was trying to make good choices and do portion control. She has been supplementing with protein shakes. Her weight is 192 lb (87.1 kg) today and has not lost weight since her last visit. She has lost 24 lbs since starting treatment with [place].  Vitamin D deficiency Whitney Davenport has a diagnosis of vitamin D deficiency. Whitney Davenport is currently taking vit D and she admits fatigue, but denies nausea, vomiting or muscle weakness.  Pre-Diabetes Whitney Davenport has a diagnosis of prediabetes based on her elevated Hgb A1c and was informed this puts her at greater risk of developing diabetes. She has no GI side effects of metformin. Whitney Davenport continues to work on diet and exercise to decrease risk of diabetes. She denies and carb cravings, nausea or hypoglycemia.  At risk for diabetes Whitney Davenport is at higher than average risk for developing diabetes due to her obesity and prediabetes. She currently denies polyuria or polydipsia.  ALLERGIES: Allergies  Allergen Reactions  . Sulfa Antibiotics Nausea And Vomiting and Other (See Comments)    Causes headache with nausea  . Hydrocodone Itching and Rash  . Zithromax [Azithromycin] Itching and Rash    MEDICATIONS: Current Outpatient Medications on File Prior to Visit  Medication Sig Dispense Refill  . cetirizine (ZYRTEC) 10 MG tablet Take 10 mg by mouth at bedtime.     . clonazePAM (KLONOPIN) 0.5 MG tablet Take 0.5 mg by mouth 2 (two) times daily as needed for anxiety.     Marland Kitchen estradiol (VIVELLE-DOT) 0.075 MG/24HR Place 1 patch onto the skin 2 (two) times a week.    . metFORMIN (GLUCOPHAGE) 500 MG  tablet Take 1 tablet (500 mg total) by mouth every morning. 30 tablet 0  . progesterone (PROMETRIUM) 100 MG capsule Take 100 mg by mouth at bedtime.    . sertraline (ZOLOFT) 100 MG tablet Take 100 mg by mouth at bedtime.     . simvastatin (ZOCOR) 40 MG tablet Take 40 mg by mouth at bedtime.     . Vitamin D, Ergocalciferol, (DRISDOL) 50000 units CAPS capsule Take 1 capsule (50,000 Units total) by mouth every 7 (seven) days. 4 capsule 0   No current facility-administered medications on file prior to visit.     PAST MEDICAL HISTORY: Past Medical History:  Diagnosis Date  . Allergy   . Anemia    hx of  . Anxiety   . Back injury    "ruptured disc-no problems since surgery"  . Back pain   . Constipation   . Depression   . GERD (gastroesophageal reflux disease)    occ  . Hepatitis 2004   hx of hep c-"interferon treatment, does not show up on blood test anymore"  . History of kidney stones   . Hyperlipidemia   . Hypotension    no fainting in years  . Infertility, female   . Lumbar disc disease   . Osteoarthritis   . Vitamin D deficiency     PAST SURGICAL HISTORY: Past Surgical History:  Procedure Laterality Date  . DIAGNOSTIC LAPAROSCOPY  last 2001   for endometriosis, couple of surgeries  . GANGLION CYST  EXCISION  age 36  . LUMBAR LAMINECTOMY/DECOMPRESSION MICRODISCECTOMY  11/03/2011   Procedure: LUMBAR LAMINECTOMY/DECOMPRESSION MICRODISCECTOMY;  Surgeon: Hosie Spangle, MD;  Location: [facility];  Service: Neurosurgery;  Laterality: Left;  LEFT Lumbar five sacral one laminotomy and microdiskectomy  . TOTAL HIP ARTHROPLASTY Right 02/03/2015   Procedure: RIGHT TOTAL HIP ARTHROPLASTY ANTERIOR APPROACH;  Surgeon: Paralee Cancel, MD;  Location: [facility];  Service: Orthopedics;  Laterality: Right;    SOCIAL HISTORY: Social History   Tobacco Use  . Smoking status: Former Smoker    Years: 15.00    Types: Cigarettes    Last attempt to quit: 10/31/2010    Years since quitting:  7.6  . Smokeless tobacco: Never Used  Substance Use Topics  . Alcohol use: Yes    Comment: rare  . Drug use: No    FAMILY HISTORY: Family History  Problem Relation Age of Onset  . Thyroid disease Mother   . Diabetes Father   . Hyperlipidemia Father   . Obesity Father   . Cancer Maternal Grandmother   . Colon cancer Neg Hx     ROS: Review of Systems  Constitutional: Positive for malaise/fatigue. Negative for weight loss.  Gastrointestinal: Negative for diarrhea, nausea and vomiting.  Genitourinary: Negative for frequency.  Musculoskeletal:       Negative for muscle weakness  Endo/Heme/Allergies: Negative for polydipsia.       Negative for carb cravings Negative for hypoglycemia    PHYSICAL EXAM: Blood pressure 102/72, pulse 70, temperature 98.3 F (36.8 C), temperature source Oral, height 5\' 7"  (1.702 m), weight 192 lb (87.1 kg), SpO2 99 %. Body mass index is 30.07 kg/m. Physical Exam  Constitutional: She is oriented to person, place, and time. She appears well-developed and well-nourished.  Cardiovascular: Normal rate.  Pulmonary/Chest: Effort normal.  Musculoskeletal: Normal range of motion.  Neurological: She is oriented to person, place, and time.  Skin: Skin is warm and dry.  Psychiatric: She has a normal mood and affect. Her behavior is normal.  Vitals reviewed.   RECENT LABS AND TESTS: BMET    Component Value Date/Time   NA 143 04/02/2018 0927   K 5.1 04/02/2018 0927   CL 103 04/02/2018 0927   CO2 26 04/02/2018 0927   GLUCOSE 99 04/02/2018 0927   GLUCOSE 103 (H) 09/14/2017 1001   BUN 12 04/02/2018 0927   CREATININE 0.97 04/02/2018 0927   CALCIUM 9.7 04/02/2018 0927   GFRNONAA 67 04/02/2018 0927   GFRAA 77 04/02/2018 0927   Lab Results  Component Value Date   HGBA1C 5.8 (H) 04/02/2018   HGBA1C 5.8 (H) 11/22/2017   HGBA1C 5.6 08/09/2017   HGBA1C 5.6 05/08/2017   HGBA1C 5.9 (H) 01/02/2017   Lab Results  Component Value Date   INSULIN 15.5  04/02/2018   INSULIN 12.1 11/22/2017   INSULIN 14.4 08/09/2017   INSULIN 20.1 05/08/2017   INSULIN 25.0 (H) 01/02/2017   CBC    Component Value Date/Time   WBC 5.7 11/22/2017 0925   WBC 6.1 09/14/2017 1015   RBC 4.34 11/22/2017 0925   RBC 4.26 09/14/2017 1015   HGB 11.9 11/22/2017 0925   HCT 38.3 11/22/2017 0925   PLT 198 09/14/2017 1015   MCV 88 11/22/2017 0925   MCH 27.4 11/22/2017 0925   MCH 27.5 09/14/2017 1015   MCHC 31.1 (L) 11/22/2017 0925   MCHC 31.5 09/14/2017 1015   RDW 15.0 11/22/2017 0925   LYMPHSABS 1.5 11/22/2017 0925   EOSABS 0.1 11/22/2017 0925  BASOSABS 0.0 11/22/2017 0925   Iron/TIBC/Ferritin/ %Sat No results found for: IRON, TIBC, FERRITIN, IRONPCTSAT Lipid Panel     Component Value Date/Time   CHOL 161 04/02/2018 0927   TRIG 88 04/02/2018 0927   HDL 65 04/02/2018 0927   LDLCALC 78 04/02/2018 0927   Hepatic Function Panel     Component Value Date/Time   PROT 7.2 04/02/2018 0927   ALBUMIN 4.4 04/02/2018 0927   AST 11 04/02/2018 0927   ALT 10 04/02/2018 0927   ALKPHOS 80 04/02/2018 0927   BILITOT <0.2 04/02/2018 0927      Component Value Date/Time   TSH 4.280 01/02/2017 1007   Results for TABBETHA, KUTSCHER (MRN [id number]) as of 06/06/2018 08:27  Ref. Range 04/02/2018 09:27  Vitamin D, 25-Hydroxy Latest Ref Range: 30.0 - 100.0 ng/mL 44.8   ASSESSMENT AND PLAN: Vitamin D deficiency - Plan: Vitamin D, Ergocalciferol, (DRISDOL) 50000 units CAPS capsule  Prediabetes - Plan: metFORMIN (GLUCOPHAGE) 500 MG tablet  At risk for diabetes mellitus  Class 1 obesity with serious comorbidity and body mass index (BMI) of 30.0 to 30.9 in adult, unspecified obesity type  PLAN:  Vitamin D Deficiency Whitney Davenport was informed that low vitamin D levels contributes to fatigue and are associated with obesity, breast, and colon cancer. She agrees to continue to take prescription Vit D @50 ,000 IU every week #4 with no refills and will follow up for routine  testing of vitamin D, at least 2-3 times per year. She was informed of the risk of over-replacement of vitamin D and agrees to not increase her dose unless she discusses this with [place] first. Whitney Davenport agrees to follow up as directed.  Pre-Diabetes Whitney Davenport will continue to work on weight loss, exercise, and decreasing simple carbohydrates in her diet to help decrease the risk of diabetes. We dicussed metformin including benefits and risks. She was informed that eating too many simple carbohydrates or too many calories at one sitting increases the likelihood of GI side effects. Whitney Davenport requested metformin for now and a prescription was written today for 1 month refill. Whitney Davenport agreed to follow up with [place] as directed to monitor her progress.  Diabetes risk counseling Whitney Davenport was given extended (15 minutes) diabetes prevention counseling today. She is 52 y.o. female and has risk factors for diabetes including obesity and prediabetes. We discussed intensive lifestyle modifications today with an emphasis on weight loss as well as increasing exercise and decreasing simple carbohydrates in her diet.  Obesity Whitney Davenport is currently in the action stage of change. As such, her goal is to continue with weight loss efforts She has agreed to follow the Category 2 plan Whitney Davenport has been instructed to work up to a goal of 150 minutes of combined cardio and strengthening exercise per week for weight loss and overall health benefits. We discussed the following Behavioral Modification Strategies today: better snacking choices, planning for success, increasing vegetables and work on meal planning and easy cooking plans  Whitney Davenport has agreed to follow up with our clinic in 2 weeks. She was informed of the importance of frequent follow up visits to maximize her success with intensive lifestyle modifications for her multiple health conditions.   OBESITY BEHAVIORAL INTERVENTION VISIT  Today's visit was # 29    Starting weight: 216 lbs Starting date: 01/02/18 Today's weight : 192 lbs  Today's date: 06/06/2018 Total lbs lost to date: 24 At least 15 minutes were spent on discussing the following behavioral intervention visit.   ASK: We discussed the  diagnosis of obesity with Whitney Davenport today and Whitney Davenport agreed to give [place] permission to discuss obesity behavioral modification therapy today.  ASSESS: Whitney Davenport has the diagnosis of obesity and her BMI today is 30.06 Whitney Davenport is in the action stage of change   ADVISE: Whitney Davenport was educated on the multiple health risks of obesity as well as the benefit of weight loss to improve her health. She was advised of the need for long term treatment and the importance of lifestyle modifications to improve her current health and to decrease her risk of future health problems.  AGREE: Multiple dietary modification options and treatment options were discussed and  Orian agreed to follow the recommendations documented in the above note.  ARRANGE: Eva was educated on the importance of frequent visits to treat obesity as outlined per CMS and USPSTF guidelines and agreed to schedule her next follow up appointment today.  I, Doreene Nest, am acting as Location manager for [employer]  I have reviewed the above documentation for accuracy and completeness, and I agree with the above. - Ilene Qua, MD

## 2018-06-08 MED FILL — metFORMIN HCL 500 MG TABS: 500 | 30 days supply | Qty: 30 | Fill #0

## 2018-06-12 MED FILL — SIMVASTATIN 40 MG TABLET: 40 | 90 days supply | Qty: 90 | Fill #0

## 2018-06-13 DIAGNOSIS — Z01419 Encounter for gynecological examination (general) (routine) without abnormal findings: Secondary | ICD-10-CM | POA: Diagnosis not present

## 2018-06-13 DIAGNOSIS — Z6830 Body mass index (BMI) 30.0-30.9, adult: Secondary | ICD-10-CM | POA: Diagnosis not present

## 2018-06-13 DIAGNOSIS — Z1231 Encounter for screening mammogram for malignant neoplasm of breast: Secondary | ICD-10-CM | POA: Diagnosis not present

## 2018-06-13 MED FILL — ESTRADIOL 0.05 MG/24HR PTTW: 0.05 | 28 days supply | Qty: 8 | Fill #0

## 2018-06-21 ENCOUNTER — Encounter (INDEPENDENT_AMBULATORY_CARE_PROVIDER_SITE_OTHER): Payer: Self-pay | Admitting: Bariatrics

## 2018-06-21 ENCOUNTER — Ambulatory Visit (INDEPENDENT_AMBULATORY_CARE_PROVIDER_SITE_OTHER): Payer: 59 | Admitting: Bariatrics

## 2018-06-21 VITALS — BP 94/68 | HR 76 | Temp 97.8°F | Ht 67.0 in | Wt 192.0 lb

## 2018-06-21 DIAGNOSIS — Z6830 Body mass index (BMI) 30.0-30.9, adult: Secondary | ICD-10-CM

## 2018-06-21 DIAGNOSIS — R7303 Prediabetes: Secondary | ICD-10-CM | POA: Diagnosis not present

## 2018-06-21 DIAGNOSIS — E559 Vitamin D deficiency, unspecified: Secondary | ICD-10-CM

## 2018-06-21 DIAGNOSIS — E669 Obesity, unspecified: Secondary | ICD-10-CM | POA: Diagnosis not present

## 2018-06-21 NOTE — Progress Notes (Signed)
Office: [phone]  /  Fax: [phone]   HPI:   Chief Complaint: OBESITY Whitney Davenport is here to discuss her progress with her obesity treatment plan. She is on the Category 2 plan and is following her eating plan approximately 40 % of the time. She states she is exercising 0 minutes 0 times per week. Whitney Davenport has minimal hunger and she has occasional sweets in the evening and at night, but this is manageable. Her weight is 192 lb (87.1 kg) today and she has maintained weight over a period of 2 weeks since her last visit. She has lost 24 lbs since starting treatment with [place].  Vitamin D deficiency Whitney Davenport has a diagnosis of vitamin D deficiency. Whitney Davenport is currently taking high dose vit D and she denies nausea, vomiting or muscle weakness.  Pre-Diabetes Whitney Davenport has a diagnosis of prediabetes based on her elevated Hgb A1c of 5.8 and an insulin level of 15.5 on 04/02/18. She was informed this puts her at greater risk of developing diabetes. Whitney Davenport is taking metformin without side effects and she continues to work on diet and exercise to decrease risk of diabetes. She denies nausea, vomiting or hypoglycemia.  ALLERGIES: Allergies  Allergen Reactions  . Sulfa Antibiotics Nausea And Vomiting and Other (See Comments)    Causes headache with nausea  . Hydrocodone Itching and Rash  . Zithromax [Azithromycin] Itching and Rash    MEDICATIONS: Current Outpatient Medications on File Prior to Visit  Medication Sig Dispense Refill  . cetirizine (ZYRTEC) 10 MG tablet Take 10 mg by mouth at bedtime.     . clonazePAM (KLONOPIN) 0.5 MG tablet Take 0.5 mg by mouth 2 (two) times daily as needed for anxiety.     Marland Kitchen estradiol (VIVELLE-DOT) 0.075 MG/24HR Place 1 patch onto the skin 2 (two) times a week.    . metFORMIN (GLUCOPHAGE) 500 MG tablet Take 1 tablet (500 mg total) by mouth every morning. 30 tablet 0  . progesterone (PROMETRIUM) 100 MG capsule Take 100 mg by mouth at bedtime.    .  sertraline (ZOLOFT) 100 MG tablet Take 100 mg by mouth at bedtime.     . simvastatin (ZOCOR) 40 MG tablet Take 40 mg by mouth at bedtime.     . Vitamin D, Ergocalciferol, (DRISDOL) 50000 units CAPS capsule Take 1 capsule (50,000 Units total) by mouth every 7 (seven) days. 4 capsule 0   No current facility-administered medications on file prior to visit.     PAST MEDICAL HISTORY: Past Medical History:  Diagnosis Date  . Allergy   . Anemia    hx of  . Anxiety   . Back injury    "ruptured disc-no problems since surgery"  . Back pain   . Constipation   . Depression   . GERD (gastroesophageal reflux disease)    occ  . Hepatitis 2004   hx of hep c-"interferon treatment, does not show up on blood test anymore"  . History of kidney stones   . Hyperlipidemia   . Hypotension    no fainting in years  . Infertility, female   . Lumbar disc disease   . Osteoarthritis   . Vitamin D deficiency     PAST SURGICAL HISTORY: Past Surgical History:  Procedure Laterality Date  . DIAGNOSTIC LAPAROSCOPY  last 2001   for endometriosis, couple of surgeries  . GANGLION CYST EXCISION  age 72  . LUMBAR LAMINECTOMY/DECOMPRESSION MICRODISCECTOMY  11/03/2011   Procedure: LUMBAR LAMINECTOMY/DECOMPRESSION MICRODISCECTOMY;  Surgeon: Hosie Spangle, MD;  Location: [facility] [facility];  Service: Neurosurgery;  Laterality: Left;  LEFT Lumbar five sacral one laminotomy and microdiskectomy  . TOTAL HIP ARTHROPLASTY Right 02/03/2015   Procedure: RIGHT TOTAL HIP ARTHROPLASTY ANTERIOR APPROACH;  Surgeon: Paralee Cancel, MD;  Location: [facility];  Service: Orthopedics;  Laterality: Right;    SOCIAL HISTORY: Social History   Tobacco Use  . Smoking status: Former Smoker    Years: 15.00    Types: Cigarettes    Last attempt to quit: 10/31/2010    Years since quitting: 7.6  . Smokeless tobacco: Never Used  Substance Use Topics  . Alcohol use: Yes    Comment: rare  . Drug use: No    FAMILY HISTORY: Family History    Problem Relation Age of Onset  . Thyroid disease Mother   . Diabetes Father   . Hyperlipidemia Father   . Obesity Father   . Cancer Maternal Grandmother   . Colon cancer Neg Hx     ROS: Review of Systems  Constitutional: Negative for weight loss.  Gastrointestinal: Negative for nausea and vomiting.  Musculoskeletal:       Negative for muscle weakness  Endo/Heme/Allergies:       Negative for hypoglycemia    PHYSICAL EXAM: Blood pressure 94/68, pulse 76, temperature 97.8 F (36.6 C), temperature source Oral, height 5\' 7"  (1.702 m), weight 192 lb (87.1 kg), SpO2 96 %. Body mass index is 30.07 kg/m. Physical Exam  Constitutional: She is oriented to person, place, and time. She appears well-developed and well-nourished.  Cardiovascular: Normal rate.  Pulmonary/Chest: Effort normal.  Musculoskeletal: Normal range of motion.  Neurological: She is oriented to person, place, and time.  Skin: Skin is warm and dry.  Psychiatric: She has a normal mood and affect. Her behavior is normal.  Vitals reviewed.   RECENT LABS AND TESTS: BMET    Component Value Date/Time   NA 143 04/02/2018 0927   K 5.1 04/02/2018 0927   CL 103 04/02/2018 0927   CO2 26 04/02/2018 0927   GLUCOSE 99 04/02/2018 0927   GLUCOSE 103 (H) 09/14/2017 1001   BUN 12 04/02/2018 0927   CREATININE 0.97 04/02/2018 0927   CALCIUM 9.7 04/02/2018 0927   GFRNONAA 67 04/02/2018 0927   GFRAA 77 04/02/2018 0927   Lab Results  Component Value Date   HGBA1C 5.8 (H) 04/02/2018   HGBA1C 5.8 (H) 11/22/2017   HGBA1C 5.6 08/09/2017   HGBA1C 5.6 05/08/2017   HGBA1C 5.9 (H) 01/02/2017   Lab Results  Component Value Date   INSULIN 15.5 04/02/2018   INSULIN 12.1 11/22/2017   INSULIN 14.4 08/09/2017   INSULIN 20.1 05/08/2017   INSULIN 25.0 (H) 01/02/2017   CBC    Component Value Date/Time   WBC 5.7 11/22/2017 0925   WBC 6.1 09/14/2017 1015   RBC 4.34 11/22/2017 0925   RBC 4.26 09/14/2017 1015   HGB 11.9  11/22/2017 0925   HCT 38.3 11/22/2017 0925   PLT 198 09/14/2017 1015   MCV 88 11/22/2017 0925   MCH 27.4 11/22/2017 0925   MCH 27.5 09/14/2017 1015   MCHC 31.1 (L) 11/22/2017 0925   MCHC 31.5 09/14/2017 1015   RDW 15.0 11/22/2017 0925   LYMPHSABS 1.5 11/22/2017 0925   EOSABS 0.1 11/22/2017 0925   BASOSABS 0.0 11/22/2017 0925   Iron/TIBC/Ferritin/ %Sat No results found for: IRON, TIBC, FERRITIN, IRONPCTSAT Lipid Panel     Component Value Date/Time   CHOL 161 04/02/2018 0927   TRIG 88 04/02/2018 0927  HDL 65 04/02/2018 0927   LDLCALC 78 04/02/2018 0927   Hepatic Function Panel     Component Value Date/Time   PROT 7.2 04/02/2018 0927   ALBUMIN 4.4 04/02/2018 0927   AST 11 04/02/2018 0927   ALT 10 04/02/2018 0927   ALKPHOS 80 04/02/2018 0927   BILITOT <0.2 04/02/2018 0927      Component Value Date/Time   TSH 4.280 01/02/2017 1007   Results for CHARIDY, CAPPELLETTI (MRN [id number]) as of 06/21/2018 11:10  Ref. Range 04/02/2018 09:27  Vitamin D, 25-Hydroxy Latest Ref Range: 30.0 - 100.0 ng/mL 44.8   ASSESSMENT AND PLAN: Vitamin D deficiency  Prediabetes  Class 1 obesity with serious comorbidity and body mass index (BMI) of 30.0 to 30.9 in adult, unspecified obesity type  PLAN:  Vitamin D Deficiency Haylie was informed that low vitamin D levels contributes to fatigue and are associated with obesity, breast, and colon cancer. She agrees to continue to take prescription Vit D @50 ,000 IU every week and will follow up for routine testing of vitamin D, at least 2-3 times per year. She was informed of the risk of over-replacement of vitamin D and agrees to not increase her dose unless she discusses this with [place] first. Stori will continue to eat vitamin D rich foods and she will follow up as directed.  Pre-Diabetes Trica will continue to work on weight loss, exercise, and decreasing simple carbohydrates in her diet to help decrease the risk of diabetes. We dicussed  metformin including benefits and risks. She was informed that eating too many simple carbohydrates or too many calories at one sitting increases the likelihood of GI side effects. Sharne will continue metformin for now and a prescription was not written today. We will recheck labs at the next visit and Laaibah agreed to follow up with [place] as directed to monitor her progress.  I spent > than 50% of the 15 minute visit on counseling as documented in the note.  Obesity Tishia is currently in the action stage of change. As such, her goal is to continue with weight loss efforts She has agreed to continue to follow the Category 2 plan Kadajah has been instructed to work up to a goal of 150 minutes of combined cardio and strengthening exercise per week for weight loss and overall health benefits. We discussed the following Behavioral Modification Strategies today: no skipping meals, increase H2O intake, increasing lean protein intake to ensure she gets adequate protein, decreasing simple carbohydrates , increasing vegetables and work on meal planning and easy cooking plans  Hanh has agreed to follow up with our clinic in 2 weeks fasting. She was informed of the importance of frequent follow up visits to maximize her success with intensive lifestyle modifications for her multiple health conditions.   OBESITY BEHAVIORAL INTERVENTION VISIT  Today's visit was # 30  Starting weight: 216 lbs Starting date: 01/02/17 Today's weight :192 lbs  Today's date: 06/21/2018 Total lbs lost to date: 24   ASK: We discussed the diagnosis of obesity with Yevette Edwards today and Colletta [place] agreed to give [place] permission to discuss obesity behavioral modification therapy today.  ASSESS: Maud has the diagnosis of obesity and her BMI today is 30.06 Gertrue is in the action stage of change   ADVISE: Andrienne was educated on the multiple health risks of obesity as well as the benefit of weight  loss to improve her health. She was advised of the need for long term treatment and the importance of  lifestyle modifications to improve her current health and to decrease her risk of future health problems.  AGREE: Multiple dietary modification options and treatment options were discussed and  Jaritza agreed to follow the recommendations documented in the above note.  ARRANGE: Parisha was educated on the importance of frequent visits to treat obesity as outlined per CMS and USPSTF guidelines and agreed to schedule her next follow up appointment today.  Corey Skains, am acting as Location manager for [employer]. Owens Shark, DO  I have reviewed the above documentation for accuracy and completeness, and I agree with the above. -Jearld Lesch, DO

## 2018-07-02 MED FILL — PROGESTERONE 100 MG CAPSULE: 100 | 30 days supply | Qty: 30 | Fill #0

## 2018-07-09 ENCOUNTER — Ambulatory Visit (INDEPENDENT_AMBULATORY_CARE_PROVIDER_SITE_OTHER): Payer: 59 | Admitting: Bariatrics

## 2018-07-09 VITALS — BP 105/72 | HR 70 | Temp 98.1°F | Ht 67.0 in | Wt 189.0 lb

## 2018-07-09 DIAGNOSIS — E559 Vitamin D deficiency, unspecified: Secondary | ICD-10-CM | POA: Diagnosis not present

## 2018-07-09 DIAGNOSIS — E669 Obesity, unspecified: Secondary | ICD-10-CM | POA: Diagnosis not present

## 2018-07-09 DIAGNOSIS — Z6830 Body mass index (BMI) 30.0-30.9, adult: Secondary | ICD-10-CM | POA: Diagnosis not present

## 2018-07-09 DIAGNOSIS — R7303 Prediabetes: Secondary | ICD-10-CM | POA: Diagnosis not present

## 2018-07-09 DIAGNOSIS — Z9189 Other specified personal risk factors, not elsewhere classified: Secondary | ICD-10-CM

## 2018-07-09 MED ORDER — VITAMIN D (ERGOCALCIFEROL) 1.25 MG (50000 UNIT) PO CAPS: 50000.0000 [IU] | ORAL_CAPSULE | ORAL | 0 refills | Status: AC

## 2018-07-09 MED FILL — VIT D2 1.25 MG (50,000 UNIT: 1.25 MG | 28 days supply | Qty: 4 | Fill #0

## 2018-07-09 NOTE — Progress Notes (Signed)
Office: [phone]  /  Fax: [phone]   HPI:   Chief Complaint: OBESITY Whitney Davenport is here to discuss her progress with her obesity treatment plan. She is on the  follow the Category 2 plan and is following her eating plan approximately 50 % of the time. She states she is exercising 0 minutes 0 times per week. Whitney Davenport is doing well on Cat 2, her hunger is controlled and she is occasional craving for sweets (late afternoon). She does not everything on her plan for lunch. Her weight is 189 lb (85.7 kg) today and has had a weight loss of 3 pounds over a period of 2 weeks since her last visit. She has lost 27 lbs since starting treatment with [place].  Vitamin D deficiency Whitney Davenport has a diagnosis of vitamin D deficiency. She is currently taking vit D and denies nausea, vomiting or muscle weakness.  Pre-Diabetes Whitney Davenport has a diagnosis of prediabetes based on her elevated HgA1c and was informed this puts her at greater risk of developing diabetes. She is taking metformin currently and continues to work on diet and exercise to decrease risk of diabetes. She denies nausea or hypoglycemia.   ALLERGIES: Allergies  Allergen Reactions  . Sulfa Antibiotics Nausea And Vomiting and Other (See Comments)    Causes headache with nausea  . Hydrocodone Itching and Rash  . Zithromax [Azithromycin] Itching and Rash    MEDICATIONS: Current Outpatient Medications on File Prior to Visit  Medication Sig Dispense Refill  . cetirizine (ZYRTEC) 10 MG tablet Take 10 mg by mouth at bedtime.     . clonazePAM (KLONOPIN) 0.5 MG tablet Take 0.5 mg by mouth 2 (two) times daily as needed for anxiety.     Marland Kitchen estradiol (VIVELLE-DOT) 0.075 MG/24HR Place 1 patch onto the skin 2 (two) times a week.    . metFORMIN (GLUCOPHAGE) 500 MG tablet Take 1 tablet (500 mg total) by mouth every morning. 30 tablet 0  . progesterone (PROMETRIUM) 100 MG capsule Take 100 mg by mouth at bedtime.    . sertraline (ZOLOFT) 100 MG  tablet Take 100 mg by mouth at bedtime.     . simvastatin (ZOCOR) 40 MG tablet Take 40 mg by mouth at bedtime.      No current facility-administered medications on file prior to visit.     PAST MEDICAL HISTORY: Past Medical History:  Diagnosis Date  . Allergy   . Anemia    hx of  . Anxiety   . Back injury    "ruptured disc-no problems since surgery"  . Back pain   . Constipation   . Depression   . GERD (gastroesophageal reflux disease)    occ  . Hepatitis 2004   hx of hep c-"interferon treatment, does not show up on blood test anymore"  . History of kidney stones   . Hyperlipidemia   . Hypotension    no fainting in years  . Infertility, female   . Lumbar disc disease   . Osteoarthritis   . Vitamin D deficiency     PAST SURGICAL HISTORY: Past Surgical History:  Procedure Laterality Date  . DIAGNOSTIC LAPAROSCOPY  last 2001   for endometriosis, couple of surgeries  . GANGLION CYST EXCISION  age 23  . LUMBAR LAMINECTOMY/DECOMPRESSION MICRODISCECTOMY  11/03/2011   Procedure: LUMBAR LAMINECTOMY/DECOMPRESSION MICRODISCECTOMY;  Surgeon: Hosie Spangle, MD;  Location: [facility];  Service: Neurosurgery;  Laterality: Left;  LEFT Lumbar five sacral one laminotomy and microdiskectomy  . TOTAL HIP ARTHROPLASTY  Right 02/03/2015   Procedure: RIGHT TOTAL HIP ARTHROPLASTY ANTERIOR APPROACH;  Surgeon: Paralee Cancel, MD;  Location: [facility];  Service: Orthopedics;  Laterality: Right;    SOCIAL HISTORY: Social History   Tobacco Use  . Smoking status: Former Smoker    Years: 15.00    Types: Cigarettes    Last attempt to quit: 10/31/2010    Years since quitting: 7.6  . Smokeless tobacco: Never Used  Substance Use Topics  . Alcohol use: Yes    Comment: rare  . Drug use: No    FAMILY HISTORY: Family History  Problem Relation Age of Onset  . Thyroid disease Mother   . Diabetes Father   . Hyperlipidemia Father   . Obesity Father   . Cancer Maternal Grandmother   . Colon  cancer Neg Hx     ROS: Review of Systems  Constitutional: Positive for weight loss.  All other systems reviewed and are negative.   PHYSICAL EXAM: Blood pressure 105/72, pulse 70, temperature 98.1 F (36.7 C), temperature source Oral, height 5\' 7"  (1.702 m), weight 189 lb (85.7 kg), last menstrual period 06/25/2018, SpO2 97 %. Body mass index is 29.6 kg/m. Physical Exam  Constitutional: She is oriented to person, place, and time. She appears well-developed and well-nourished.  HENT:  Head: Normocephalic.  Eyes: EOM are normal.  Neck: Normal range of motion.  Pulmonary/Chest: Effort normal.  Musculoskeletal: Normal range of motion.  Neurological: She is alert and oriented to person, place, and time.  Skin: Skin is warm and dry.  Psychiatric: She has a normal mood and affect. Her behavior is normal.  Vitals reviewed.   RECENT LABS AND TESTS: BMET    Component Value Date/Time   NA 143 04/02/2018 0927   K 5.1 04/02/2018 0927   CL 103 04/02/2018 0927   CO2 26 04/02/2018 0927   GLUCOSE 99 04/02/2018 0927   GLUCOSE 103 (H) 09/14/2017 1001   BUN 12 04/02/2018 0927   CREATININE 0.97 04/02/2018 0927   CALCIUM 9.7 04/02/2018 0927   GFRNONAA 67 04/02/2018 0927   GFRAA 77 04/02/2018 0927   Lab Results  Component Value Date   HGBA1C 5.8 (H) 04/02/2018   HGBA1C 5.8 (H) 11/22/2017   HGBA1C 5.6 08/09/2017   HGBA1C 5.6 05/08/2017   HGBA1C 5.9 (H) 01/02/2017   Lab Results  Component Value Date   INSULIN 15.5 04/02/2018   INSULIN 12.1 11/22/2017   INSULIN 14.4 08/09/2017   INSULIN 20.1 05/08/2017   INSULIN 25.0 (H) 01/02/2017   CBC    Component Value Date/Time   WBC 5.7 11/22/2017 0925   WBC 6.1 09/14/2017 1015   RBC 4.34 11/22/2017 0925   RBC 4.26 09/14/2017 1015   HGB 11.9 11/22/2017 0925   HCT 38.3 11/22/2017 0925   PLT 198 09/14/2017 1015   MCV 88 11/22/2017 0925   MCH 27.4 11/22/2017 0925   MCH 27.5 09/14/2017 1015   MCHC 31.1 (L) 11/22/2017 0925   MCHC 31.5  09/14/2017 1015   RDW 15.0 11/22/2017 0925   LYMPHSABS 1.5 11/22/2017 0925   EOSABS 0.1 11/22/2017 0925   BASOSABS 0.0 11/22/2017 0925   Iron/TIBC/Ferritin/ %Sat No results found for: IRON, TIBC, FERRITIN, IRONPCTSAT Lipid Panel     Component Value Date/Time   CHOL 161 04/02/2018 0927   TRIG 88 04/02/2018 0927   HDL 65 04/02/2018 0927   LDLCALC 78 04/02/2018 0927   Hepatic Function Panel     Component Value Date/Time   PROT 7.2 04/02/2018 0927  ALBUMIN 4.4 04/02/2018 0927   AST 11 04/02/2018 0927   ALT 10 04/02/2018 0927   ALKPHOS 80 04/02/2018 0927   BILITOT <0.2 04/02/2018 0927      Component Value Date/Time   TSH 4.280 01/02/2017 1007    ASSESSMENT AND PLAN: Vitamin D deficiency - Plan: VITAMIN D 25 Hydroxy (Vit-D Deficiency, Fractures), Vitamin D, Ergocalciferol, (DRISDOL) 50000 units CAPS capsule  Prediabetes - Plan: Comprehensive metabolic panel, Hemoglobin A1c, Insulin, random  At risk for diabetes mellitus  Class 1 obesity with serious comorbidity and body mass index (BMI) of 30.0 to 30.9 in adult, unspecified obesity type - Starting BMI greater then 30  PLAN:  Vitamin D Deficiency Whitney Davenport was informed that low vitamin D levels contributes to fatigue and are associated with obesity, breast, and colon cancer. She agrees to continue to take prescription Vit D @50 ,000 IU every week  In which it was written for her today for a month supply and will follow up for routine testing of vitamin D, at least 2-3 times per year. She was informed of the risk of over-replacement of vitamin D and agrees to not increase her dose unless she discusses this with [place] first. Will increase Vit D enriched foods. She is taking Vit D without side effects.   Pre-Diabetes Whitney Davenport will continue to work on weight loss, exercise, and decreasing simple carbohydrates in her diet to help decrease the risk of diabetes. We dicussed metformin including benefits and risks. She was informed that  eating too many simple carbohydrates or too many calories at one sitting increases the likelihood of GI side effects. Whitney Davenport does not need a prescription for metformin for now and a prescription was not written today. Whitney Davenport agreed to follow up with [place] as directed to monitor her progress. She denies polydipsia and polyphagia. She is currently taking Metformin without any side effects.   Diabetes risk counselling Whitney Davenport was given extended (15 minutes) diabetes prevention counseling today. She is 52 y.o. female and has risk factors for diabetes including obesity. We discussed intensive lifestyle modifications today with an emphasis on weight loss as well as increasing exercise and decreasing simple carbohydrates in her diet.  Obesity Whitney Davenport is currently in the action stage of change. As such, her goal is to continue with weight loss efforts She has agreed to follow the Category 2 plan Whitney Davenport has been instructed to work up to a goal of 150 minutes of combined cardio and strengthening exercise per week for weight loss and overall health benefits. Will increase protein to decrease snacks. When eating out will make good choices such as "[employer] on the [place]"  We discussed the following Behavioral Modification Stratagies today: increasing lean protein intake, decreasing simple carbohydrates , increasing vegetables and decrease eating out and increase water intake.    Whitney Davenport has agreed to follow up with our clinic in 2 weeks. She was informed of the importance of frequent follow up visits to maximize her success with intensive lifestyle modifications for her multiple health conditions.   OBESITY BEHAVIORAL INTERVENTION VISIT  Today's visit was # 31  Starting weight: 216 lb Starting date: 01/02/17 Today's weight : Weight: 189 lb (85.7 kg)  Today's date: 07/09/2018 Total lbs lost to date:27   ASK: We discussed the diagnosis of obesity with Whitney Davenport today and Whitney Davenport  agreed to give [place] permission to discuss obesity behavioral modification therapy today.  ASSESS: Clorene has the diagnosis of obesity and her BMI today is @TBMI @ Whitney Davenport is in  the action stage of change   ADVISE: Whitney Davenport was educated on the multiple health risks of obesity as well as the benefit of weight loss to improve her health. She was advised of the need for long term treatment and the importance of lifestyle modifications to improve her current health and to decrease her risk of future health problems.  AGREE: Multiple dietary modification options and treatment options were discussed and  Whitney Davenport agreed to follow the recommendations documented in the above note.  ARRANGE: Whitney Davenport was educated on the importance of frequent visits to treat obesity as outlined per CMS and USPSTF guidelines and agreed to schedule her next follow up appointment today.  I, April Moore, am acting as transcriptionist for Dr Jearld Lesch

## 2018-07-10 LAB — HEMOGLOBIN A1C
ESTIMATED AVERAGE GLUCOSE: 120 mg/dL
Hgb A1c MFr Bld: 5.8 % — ABNORMAL HIGH (ref 4.8–5.6)

## 2018-07-10 LAB — COMPREHENSIVE METABOLIC PANEL
ALT: 8 IU/L (ref 0–32)
AST: 12 IU/L (ref 0–40)
Albumin/Globulin Ratio: 1.7 (ref 1.2–2.2)
Albumin: 4.3 g/dL (ref 3.5–5.5)
Alkaline Phosphatase: 74 IU/L (ref 39–117)
BUN/Creatinine Ratio: 12 (ref 9–23)
BUN: 12 mg/dL (ref 6–24)
Bilirubin Total: 0.2 mg/dL (ref 0.0–1.2)
CALCIUM: 9.4 mg/dL (ref 8.7–10.2)
CO2: 22 mmol/L (ref 20–29)
CREATININE: 1.03 mg/dL — AB (ref 0.57–1.00)
Chloride: 103 mmol/L (ref 96–106)
GFR calc Af Amer: 72 mL/min/{1.73_m2} (ref >59)
GFR, EST NON AFRICAN AMERICAN: 62 mL/min/{1.73_m2} (ref >59)
GLOBULIN, TOTAL: 2.6 g/dL (ref 1.5–4.5)
GLUCOSE: 88 mg/dL (ref 65–99)
Potassium: 4.8 mmol/L (ref 3.5–5.2)
SODIUM: 142 mmol/L (ref 134–144)
Total Protein: 6.9 g/dL (ref 6.0–8.5)

## 2018-07-10 LAB — VITAMIN D 25 HYDROXY (VIT D DEFICIENCY, FRACTURES): Vit D, 25-Hydroxy: 36.8 ng/mL (ref 30.0–100.0)

## 2018-07-10 LAB — INSULIN, RANDOM: INSULIN: 17.5 u[IU]/mL (ref 2.6–24.9)

## 2018-07-17 MED FILL — clonazePAM 0.5 MG TABS: 0.5 | 30 days supply | Qty: 30 | Fill #0

## 2018-07-25 MED FILL — ESTRADIOL 0.05 MG/24HR PTTW: 0.05 | 28 days supply | Qty: 8 | Fill #1

## 2018-07-30 ENCOUNTER — Encounter (INDEPENDENT_AMBULATORY_CARE_PROVIDER_SITE_OTHER): Payer: Self-pay

## 2018-07-30 ENCOUNTER — Ambulatory Visit (INDEPENDENT_AMBULATORY_CARE_PROVIDER_SITE_OTHER): Payer: 59 | Admitting: Bariatrics

## 2018-07-30 DIAGNOSIS — J0100 Acute maxillary sinusitis, unspecified: Secondary | ICD-10-CM | POA: Diagnosis not present

## 2018-07-30 MED FILL — AMOXICILLIN 875 MG TABS: 875 | 10 days supply | Qty: 20 | Fill #0

## 2018-07-30 MED FILL — PROMETHAZINE W/DM SYRUP: 6.25-15 | 6 days supply | Qty: 120 | Fill #0

## 2018-08-02 MED FILL — PROGESTERONE 100 MG CAPSULE: 100 | 30 days supply | Qty: 30 | Fill #1

## 2018-08-13 MED FILL — SERTRALINE HCL 100 MG TAB: 100 | 90 days supply | Qty: 135 | Fill #1

## 2018-08-21 DIAGNOSIS — R7303 Prediabetes: Secondary | ICD-10-CM | POA: Diagnosis not present

## 2018-08-21 DIAGNOSIS — N951 Menopausal and female climacteric states: Secondary | ICD-10-CM | POA: Diagnosis not present

## 2018-08-21 DIAGNOSIS — E559 Vitamin D deficiency, unspecified: Secondary | ICD-10-CM | POA: Diagnosis not present

## 2018-08-21 DIAGNOSIS — Z8619 Personal history of other infectious and parasitic diseases: Secondary | ICD-10-CM | POA: Diagnosis not present

## 2018-08-21 DIAGNOSIS — F419 Anxiety disorder, unspecified: Secondary | ICD-10-CM | POA: Diagnosis not present

## 2018-08-21 DIAGNOSIS — J309 Allergic rhinitis, unspecified: Secondary | ICD-10-CM | POA: Diagnosis not present

## 2018-08-21 DIAGNOSIS — Z96641 Presence of right artificial hip joint: Secondary | ICD-10-CM | POA: Diagnosis not present

## 2018-08-21 DIAGNOSIS — E782 Mixed hyperlipidemia: Secondary | ICD-10-CM | POA: Diagnosis not present

## 2018-08-22 MED FILL — ESTRADIOL 0.05 MG/24HR PTTW: 0.05 | 28 days supply | Qty: 8 | Fill #2

## 2018-08-30 MED FILL — PROGESTERONE 100 MG CAPSULE: 100 | 30 days supply | Qty: 30 | Fill #2

## 2018-08-31 ENCOUNTER — Ambulatory Visit (INDEPENDENT_AMBULATORY_CARE_PROVIDER_SITE_OTHER): Payer: Self-pay | Admitting: Family Medicine

## 2018-08-31 VITALS — BP 132/82 | HR 89 | Temp 97.7°F | Wt 194.4 lb

## 2018-08-31 DIAGNOSIS — M545 Low back pain, unspecified: Secondary | ICD-10-CM

## 2018-08-31 DIAGNOSIS — B9689 Other specified bacterial agents as the cause of diseases classified elsewhere: Secondary | ICD-10-CM

## 2018-08-31 DIAGNOSIS — N760 Acute vaginitis: Secondary | ICD-10-CM

## 2018-08-31 LAB — POCT URINALYSIS DIPSTICK
BILIRUBIN UA: NEGATIVE
Glucose, UA: NEGATIVE
Ketones, UA: NEGATIVE
LEUKOCYTES UA: NEGATIVE
Nitrite, UA: NEGATIVE
PH UA: 6 (ref 5.0–8.0)
PROTEIN UA: NEGATIVE
RBC UA: NEGATIVE
Spec Grav, UA: 1.02 (ref 1.010–1.025)
UROBILINOGEN UA: NEGATIVE U/dL — AB

## 2018-08-31 MED ORDER — METRONIDAZOLE 500 MG PO TABS: 500.0000 mg | ORAL_TABLET | Freq: Two times a day (BID) | ORAL | 0 refills | Status: AC

## 2018-08-31 MED ORDER — METRONIDAZOLE 500 MG PO TABS: 500.0000 mg | ORAL_TABLET | Freq: Three times a day (TID) | ORAL | 0 refills | Status: DC

## 2018-08-31 MED FILL — metroNIDAZOLE 500 MG TABS: 500 | 7 days supply | Qty: 21 | Fill #0

## 2018-08-31 NOTE — Patient Instructions (Signed)
Bacterial Vaginosis Bacterial vaginosis is a vaginal infection that occurs when the normal balance of bacteria in the vagina is disrupted. It results from an overgrowth of certain bacteria. This is the most common vaginal infection among women ages 15-44. Because bacterial vaginosis increases your risk for STIs (sexually transmitted infections), getting treated can help reduce your risk for chlamydia, gonorrhea, herpes, and HIV (human immunodeficiency virus). Treatment is also important for preventing complications in pregnant women, because this condition can cause an early (premature) delivery. What are the causes? This condition is caused by an increase in harmful bacteria that are normally present in small amounts in the vagina. However, the reason that the condition develops is not fully understood. What increases the risk? The following factors may make you more likely to develop this condition:  Having a new sexual partner or multiple sexual partners.  Having unprotected sex.  Douching.  Having an intrauterine device (IUD).  Smoking.  Drug and alcohol abuse.  Taking certain antibiotic medicines.  Being pregnant.  You cannot get bacterial vaginosis from toilet seats, bedding, swimming pools, or contact with objects around you. What are the signs or symptoms? Symptoms of this condition include:  Grey or white vaginal discharge. The discharge can also be watery or foamy.  A fish-like odor with discharge, especially after sexual intercourse or during menstruation.  Itching in and around the vagina.  Burning or pain with urination.  Some women with bacterial vaginosis have no signs or symptoms. How is this diagnosed? This condition is diagnosed based on:  Your medical history.  A physical exam of the vagina.  Testing a sample of vaginal fluid under a microscope to look for a large amount of bad bacteria or abnormal cells. Your health care provider may use a cotton swab  or a small wooden spatula to collect the sample.  How is this treated? This condition is treated with antibiotics. These may be given as a pill, a vaginal cream, or a medicine that is put into the vagina (suppository). If the condition comes back after treatment, a second round of antibiotics may be needed. Follow these instructions at home: Medicines  Take over-the-counter and prescription medicines only as told by your health care provider.  Take or use your antibiotic as told by your health care provider. Do not stop taking or using the antibiotic even if you start to feel better. General instructions  If you have a female sexual partner, tell her that you have a vaginal infection. She should see her health care provider and be treated if she has symptoms. If you have a female sexual partner, he does not need treatment.  During treatment: ? Avoid sexual activity until you finish treatment. ? Do not douche. ? Avoid alcohol as directed by your health care provider. ? Avoid breastfeeding as directed by your health care provider.  Drink enough water and fluids to keep your urine clear or pale yellow.  Keep the area around your vagina and rectum clean. ? Wash the area daily with warm water. ? Wipe yourself from front to back after using the toilet.  Keep all follow-up visits as told by your health care provider. This is important. How is this prevented?  Do not douche.  Wash the outside of your vagina with warm water only.  Use protection when having sex. This includes latex condoms and dental dams.  Limit how many sexual partners you have. To help prevent bacterial vaginosis, it is best to have sex with just   one partner (monogamous).  Make sure you and your sexual partner are tested for STIs.  Wear cotton or cotton-lined underwear.  Avoid wearing tight pants and pantyhose, especially during summer.  Limit the amount of alcohol that you drink.  Do not use any products that  contain nicotine or tobacco, such as cigarettes and e-cigarettes. If you need help quitting, ask your health care provider.  Do not use illegal drugs. Where to find more information:  Centers for Disease Control and Prevention: www.cdc.gov/std  American Sexual Health Association (ASHA): www.ashastd.org  U.S. Department of Health and Human Services, Office on Women's Health: www.womenshealth.gov/ or https://www.womenshealth.gov/a-z-topics/bacterial-vaginosis Contact a health care provider if:  Your symptoms do not improve, even after treatment.  You have more discharge or pain when urinating.  You have a fever.  You have pain in your abdomen.  You have pain during sex.  You have vaginal bleeding between periods. Summary  Bacterial vaginosis is a vaginal infection that occurs when the normal balance of bacteria in the vagina is disrupted.  Because bacterial vaginosis increases your risk for STIs (sexually transmitted infections), getting treated can help reduce your risk for chlamydia, gonorrhea, herpes, and HIV (human immunodeficiency virus). Treatment is also important for preventing complications in pregnant women, because the condition can cause an early (premature) delivery.  This condition is treated with antibiotic medicines. These may be given as a pill, a vaginal cream, or a medicine that is put into the vagina (suppository). This information is not intended to replace advice given to you by your health care provider. Make sure you discuss any questions you have with your health care provider. Document Released: 10/03/2005 Document Revised: 02/06/2017 Document Reviewed: 06/18/2016 Elsevier Interactive Patient Education  2018 [employer] Inc[employer]  

## 2018-08-31 NOTE — Progress Notes (Signed)
Whitney Davenport is a 52 y.o. female who presents today with concerns of vaginal odor she originally descried it as fishy and then she attempted to use an over the counter odor block which exacerbated symptoms and created a more "ammonia" like smell according to the patient. She denies any real thick or concerning discharge, denies risk for STI/STD or UTI like symptoms.  Review of Systems  Constitutional: Negative for chills, fever and malaise/fatigue.  HENT: Negative for congestion, ear discharge, ear pain, sinus pain and sore throat.   Eyes: Negative.   Respiratory: Negative for cough, sputum production and shortness of breath.   Cardiovascular: Negative.  Negative for chest pain.  Gastrointestinal: Negative for abdominal pain, diarrhea, nausea and vomiting.  Genitourinary: Negative for dysuria, frequency, hematuria and urgency.       Vaginal odor and small discharge  Musculoskeletal: Positive for back pain. Negative for myalgias.  Skin: Negative.   Neurological: Negative for headaches.  Endo/Heme/Allergies: Negative.   Psychiatric/Behavioral: Negative.     O: Vitals:   08/31/18 0854  BP: 132/82  Pulse: 89  Temp: 97.7 F (36.5 C)  SpO2: 98%     Physical Exam  Constitutional: She is oriented to person, place, and time. Vital signs are normal. She appears well-developed and well-nourished. She is active.  Non-toxic appearance. She does not have a sickly appearance.  HENT:  Head: Normocephalic.  Right Ear: Hearing, tympanic membrane, external ear and ear canal normal.  Left Ear: Hearing, tympanic membrane, external ear and ear canal normal.  Nose: Nose normal.  Mouth/Throat: Uvula is midline and oropharynx is clear and moist.  Neck: Normal range of motion. Neck supple.  Cardiovascular: Normal rate, regular rhythm, normal heart sounds and normal pulses.  Pulmonary/Chest: Effort normal and breath sounds normal.  Abdominal: Soft. Bowel sounds are normal.  Musculoskeletal: Normal  range of motion.  Lymphadenopathy:       Head (right side): No submental and no submandibular adenopathy present.       Head (left side): No submental and no submandibular adenopathy present.    She has no cervical adenopathy.  Neurological: She is alert and oriented to person, place, and time.  Psychiatric: She has a normal mood and affect.  Vitals reviewed.  A: 1. Acute midline low back pain without sciatica   2. Bacterial vaginosis     P: Discussed exam findings, diagnosis etiology and medication use and indications reviewed with patient. Follow- Up and discharge instructions provided. No emergent/urgent issues found on exam.  Patient verbalized understanding of information provided and agrees with plan of care (POC), all questions answered.  Urinalysis WNL normal- only complaint of uncomplicated discharge.  1. Acute midline low back pain without sciatica - POCT Urinalysis Dipstick Results for orders placed or performed in visit on 08/31/18 (from the past 24 hour(s))  POCT Urinalysis Dipstick     Status: Abnormal   Collection Time: 08/31/18  9:03 AM  Result Value Ref Range   Color, UA yellow    Clarity, UA clear    Glucose, UA Negative Negative   Bilirubin, UA negative    Ketones, UA negative    Spec Grav, UA 1.020 1.010 - 1.025   Blood, UA negative    pH, UA 6.0 5.0 - 8.0   Protein, UA Negative Negative   Urobilinogen, UA negative (A) 0.2 or 1.0 E.U./dL   Nitrite, UA negative    Leukocytes, UA Negative Negative   Appearance clear    Odor no  2. Bacterial vaginosis Meds ordered this encounter  Medications  . DISCONTD: metroNIDAZOLE (FLAGYL) 500 MG tablet    Sig: Take 1 tablet (500 mg total) by mouth 3 (three) times daily.    Dispense:  21 tablet    Refill:  0    Order Specific Question:   Supervising Provider    Answer:   Gwendolyn Grant A [[id number]]  . metroNIDAZOLE (FLAGYL) 500 MG tablet    Sig: Take 1 tablet (500 mg total) by mouth 2 (two) times daily for  7 days.    Dispense:  14 tablet    Refill:  0    Order Specific Question:   Supervising Provider    Answer:   Gwendolyn Grant A [[id number]]   Will trial and monitor condition progress- suspect pH imbalance related to new vaginal soap used.

## 2018-09-03 ENCOUNTER — Telehealth: Payer: Self-pay

## 2018-09-03 NOTE — Telephone Encounter (Signed)
Patient did not answer the phone.

## 2018-09-10 MED FILL — SIMVASTATIN 40 MG TABLET: 40 | 90 days supply | Qty: 90 | Fill #0

## 2018-09-19 MED FILL — ESTRADIOL 0.05 MG/24HR PTTW: 0.05 | 28 days supply | Qty: 8 | Fill #3

## 2018-09-25 MED FILL — PROGESTERONE 100 MG CAPSULE: 100 | 30 days supply | Qty: 30 | Fill #3

## 2018-10-18 MED FILL — ESTRADIOL 0.05 MG/24HR PTTW: 0.05 | 28 days supply | Qty: 8 | Fill #4

## 2018-10-25 MED FILL — clonazePAM 0.5 MG TABS: 0.5 | 30 days supply | Qty: 30 | Fill #0

## 2018-10-29 MED FILL — PROGESTERONE 100 MG CAPSULE: 100 | 30 days supply | Qty: 30 | Fill #4

## 2018-11-12 MED FILL — ESTRADIOL 0.05 MG/24HR PTTW: 0.05 | 28 days supply | Qty: 8 | Fill #5

## 2018-11-12 MED FILL — SERTRALINE HCL 100 MG TAB: 100 | 90 days supply | Qty: 135 | Fill #0

## 2018-11-28 MED FILL — PROGESTERONE 100 MG CAPSULE: 100 | 30 days supply | Qty: 30 | Fill #5

## 2018-12-11 MED FILL — SIMVASTATIN 40 MG TABLET: 40 | 90 days supply | Qty: 90 | Fill #1

## 2018-12-11 MED FILL — ESTRADIOL 0.05 MG/24HR PTTW: 0.05 | 28 days supply | Qty: 8 | Fill #6 | Status: TO

## 2018-12-31 MED FILL — PROGESTERONE 100 MG CAPSULE: 100 | 30 days supply | Qty: 30 | Fill #6 | Status: TO

## 2019-01-02 MED FILL — ESTRADIOL 0.05 MG/24HR PTTW: 0.05 | 28 days supply | Qty: 8 | Fill #0

## 2019-01-24 MED FILL — SERTRALINE HCL 100 MG TAB: 100 | 90 days supply | Qty: 135 | Fill #0

## 2019-01-24 MED FILL — PROGESTERONE 100 MG CAPSULE: 100 | 30 days supply | Qty: 30 | Fill #0

## 2019-01-30 MED FILL — ESTRADIOL 0.05 MG/24HR PTTW: 0.05 | 28 days supply | Qty: 8 | Fill #1

## 2019-02-06 MED FILL — clonazePAM 0.5 MG TABS: 0.5 | 30 days supply | Qty: 30 | Fill #0

## 2019-02-27 MED FILL — PROGESTERONE 100 MG CAPSULE: 100 | 30 days supply | Qty: 30 | Fill #1

## 2019-03-05 MED FILL — ESTRADIOL 0.05 MG/24HR PTTW: 0.05 | 28 days supply | Qty: 8 | Fill #2

## 2019-03-06 MED FILL — SIMVASTATIN 40 MG TABLET: 40 | 90 days supply | Qty: 90 | Fill #0

## 2019-04-02 MED FILL — ESTRADIOL 0.05 MG/24HR PTTW: 0.05 | 28 days supply | Qty: 8 | Fill #3

## 2019-04-02 MED FILL — PROGESTERONE 100 MG CAPSULE: 100 | 30 days supply | Qty: 30 | Fill #2

## 2019-05-01 MED FILL — PROGESTERONE 100 MG CAPSULE: 100 | 30 days supply | Qty: 30 | Fill #3

## 2019-05-01 MED FILL — SERTRALINE HCL 100 MG TAB: 100 | 90 days supply | Qty: 135 | Fill #0

## 2019-05-01 MED FILL — ESTRADIOL 0.05 MG/24HR PTTW: 0.05 | 28 days supply | Qty: 8 | Fill #4

## 2019-05-29 MED FILL — ESTRADIOL 0.05 MG/24HR PTTW: 0.05 | 28 days supply | Qty: 8 | Fill #5

## 2019-05-29 MED FILL — PROGESTERONE 100 MG CAPSULE: 100 | 30 days supply | Qty: 30 | Fill #4

## 2019-06-11 MED FILL — SIMVASTATIN 40 MG TABLET: 40 | 30 days supply | Qty: 30 | Fill #0

## 2019-06-13 MED FILL — clonazePAM 0.5 MG TABS: 0.5 | 30 days supply | Qty: 30 | Fill #0

## 2019-06-18 DIAGNOSIS — Z1231 Encounter for screening mammogram for malignant neoplasm of breast: Secondary | ICD-10-CM | POA: Diagnosis not present

## 2019-06-18 DIAGNOSIS — Z6833 Body mass index (BMI) 33.0-33.9, adult: Secondary | ICD-10-CM | POA: Diagnosis not present

## 2019-06-18 DIAGNOSIS — Z01419 Encounter for gynecological examination (general) (routine) without abnormal findings: Secondary | ICD-10-CM | POA: Diagnosis not present

## 2019-06-18 MED FILL — ESTRADIOL 0.0375 MG/24HR PT: 0.0375 | 28 days supply | Qty: 8 | Fill #0

## 2019-06-25 DIAGNOSIS — R7303 Prediabetes: Secondary | ICD-10-CM | POA: Diagnosis not present

## 2019-06-25 DIAGNOSIS — Z96641 Presence of right artificial hip joint: Secondary | ICD-10-CM | POA: Diagnosis not present

## 2019-06-25 DIAGNOSIS — J309 Allergic rhinitis, unspecified: Secondary | ICD-10-CM | POA: Diagnosis not present

## 2019-06-25 DIAGNOSIS — E559 Vitamin D deficiency, unspecified: Secondary | ICD-10-CM | POA: Diagnosis not present

## 2019-06-25 DIAGNOSIS — E782 Mixed hyperlipidemia: Secondary | ICD-10-CM | POA: Diagnosis not present

## 2019-06-25 DIAGNOSIS — Z8619 Personal history of other infectious and parasitic diseases: Secondary | ICD-10-CM | POA: Diagnosis not present

## 2019-06-25 DIAGNOSIS — F419 Anxiety disorder, unspecified: Secondary | ICD-10-CM | POA: Diagnosis not present

## 2019-06-25 DIAGNOSIS — N951 Menopausal and female climacteric states: Secondary | ICD-10-CM | POA: Diagnosis not present

## 2019-07-02 MED FILL — PROGESTERONE 100 MG CAPSULE: 100 | 30 days supply | Qty: 30 | Fill #0

## 2019-07-08 MED FILL — SIMVASTATIN 40 MG TABLET: 40 | 90 days supply | Qty: 90 | Fill #0

## 2019-07-12 DIAGNOSIS — R7303 Prediabetes: Secondary | ICD-10-CM | POA: Diagnosis not present

## 2019-07-12 DIAGNOSIS — E782 Mixed hyperlipidemia: Secondary | ICD-10-CM | POA: Diagnosis not present

## 2019-07-12 DIAGNOSIS — E559 Vitamin D deficiency, unspecified: Secondary | ICD-10-CM | POA: Diagnosis not present

## 2019-07-24 MED FILL — ESTRADIOL 0.0375 MG/24HR PT: 0.0375 | 28 days supply | Qty: 8 | Fill #1

## 2019-07-24 MED FILL — VIT D2 1.25 MG (50,000 UNIT: 1.25 MG | 28 days supply | Qty: 4 | Fill #0

## 2019-07-26 MED FILL — PROGESTERONE 100 MG CAPSULE: 100 | 30 days supply | Qty: 30 | Fill #1

## 2019-08-08 MED FILL — SERTRALINE HCL 100 MG TAB: 100 | 90 days supply | Qty: 135 | Fill #0

## 2019-08-21 MED FILL — ESTRADIOL 0.0375 MG/24HR PT: 0.0375 | 28 days supply | Qty: 8 | Fill #2

## 2019-09-02 MED FILL — PROGESTERONE 100 MG CAPSULE: 100 | 30 days supply | Qty: 30 | Fill #2

## 2019-09-10 IMAGING — CR DG CHEST 2V
2 series · 2 of 2 positions shown · non-contrast
Comparison: None

CLINICAL DATA: Chest tightness and discomfort over last several
days, history anxiety, GERD

EXAM:
CHEST  2 VIEW

[w chest pa]
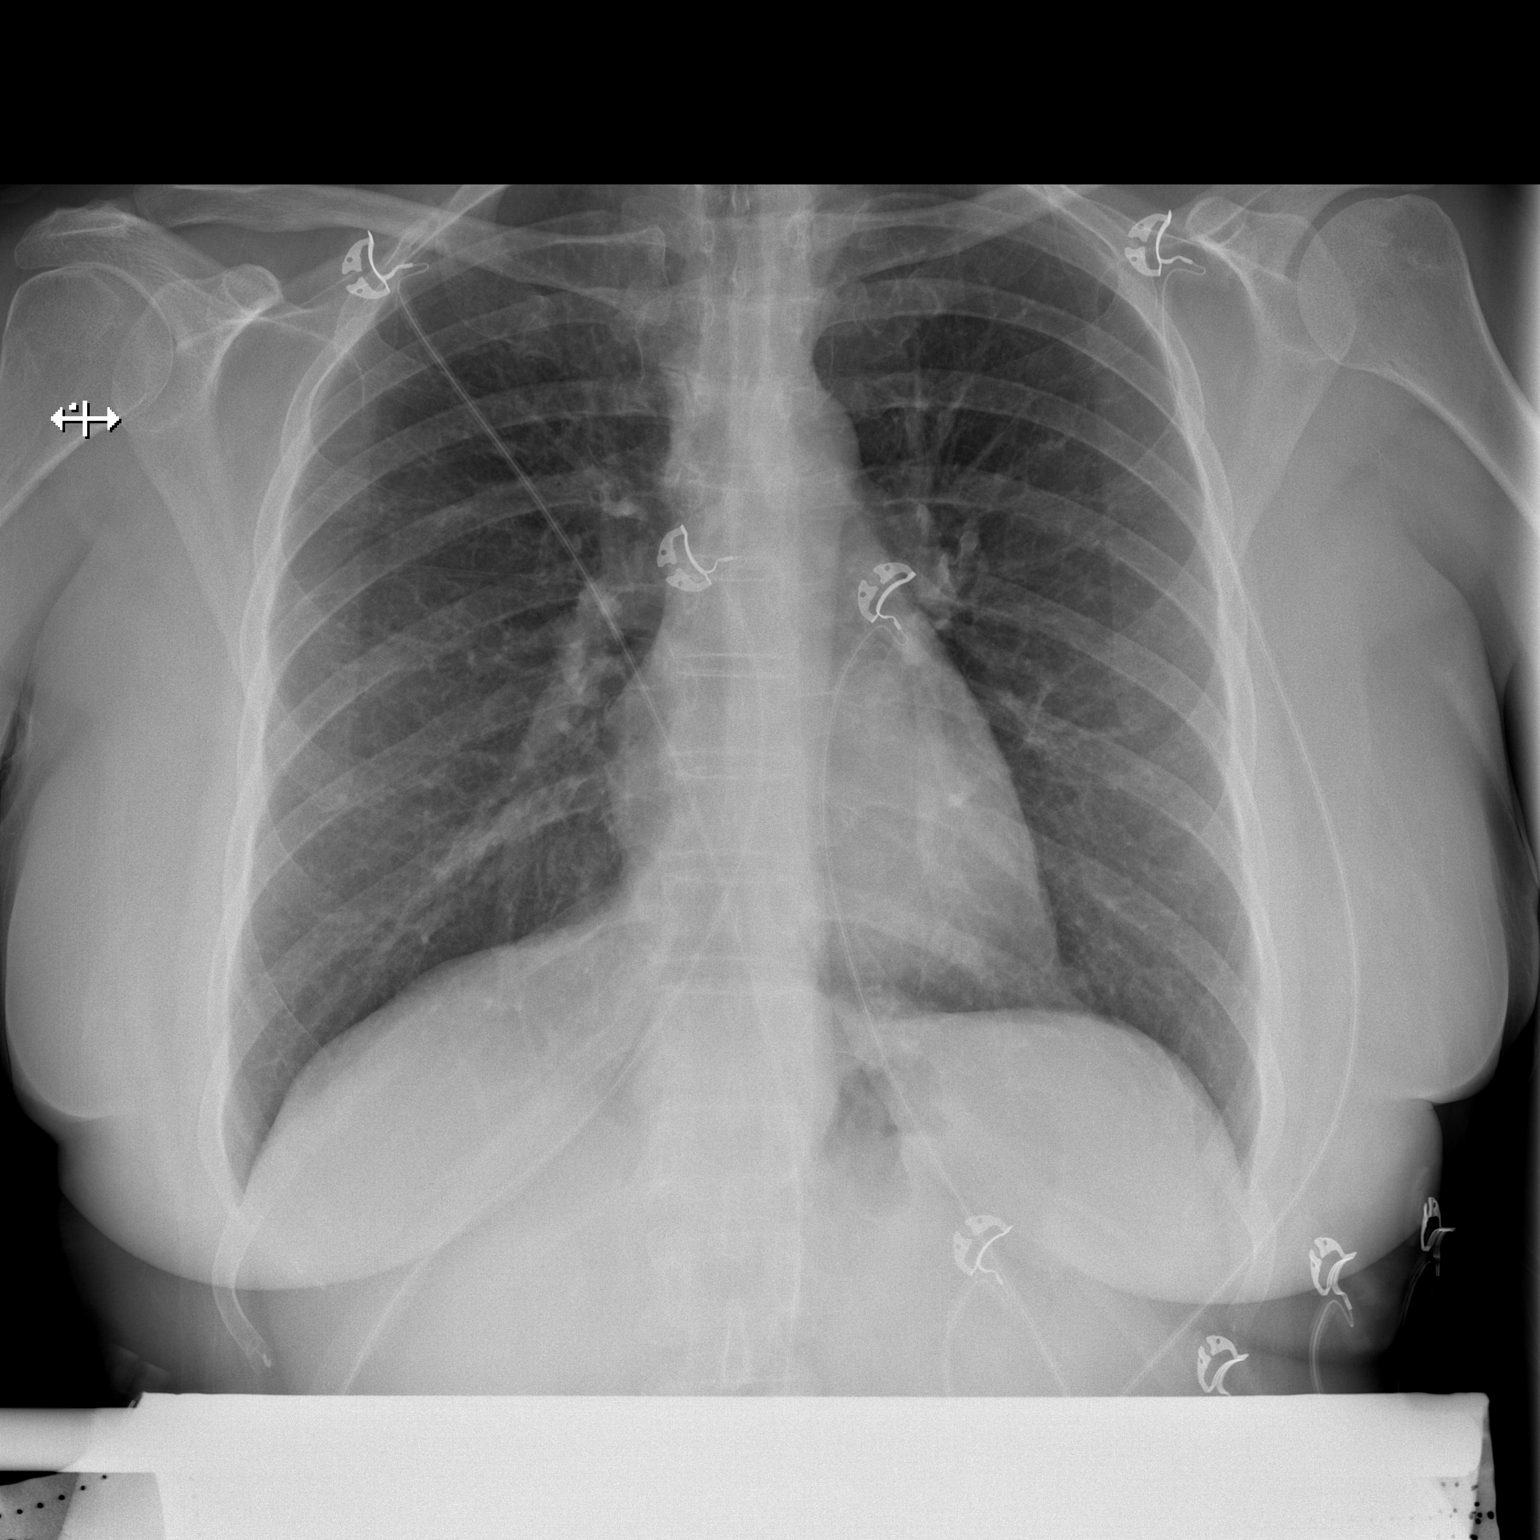

[w chest lat]
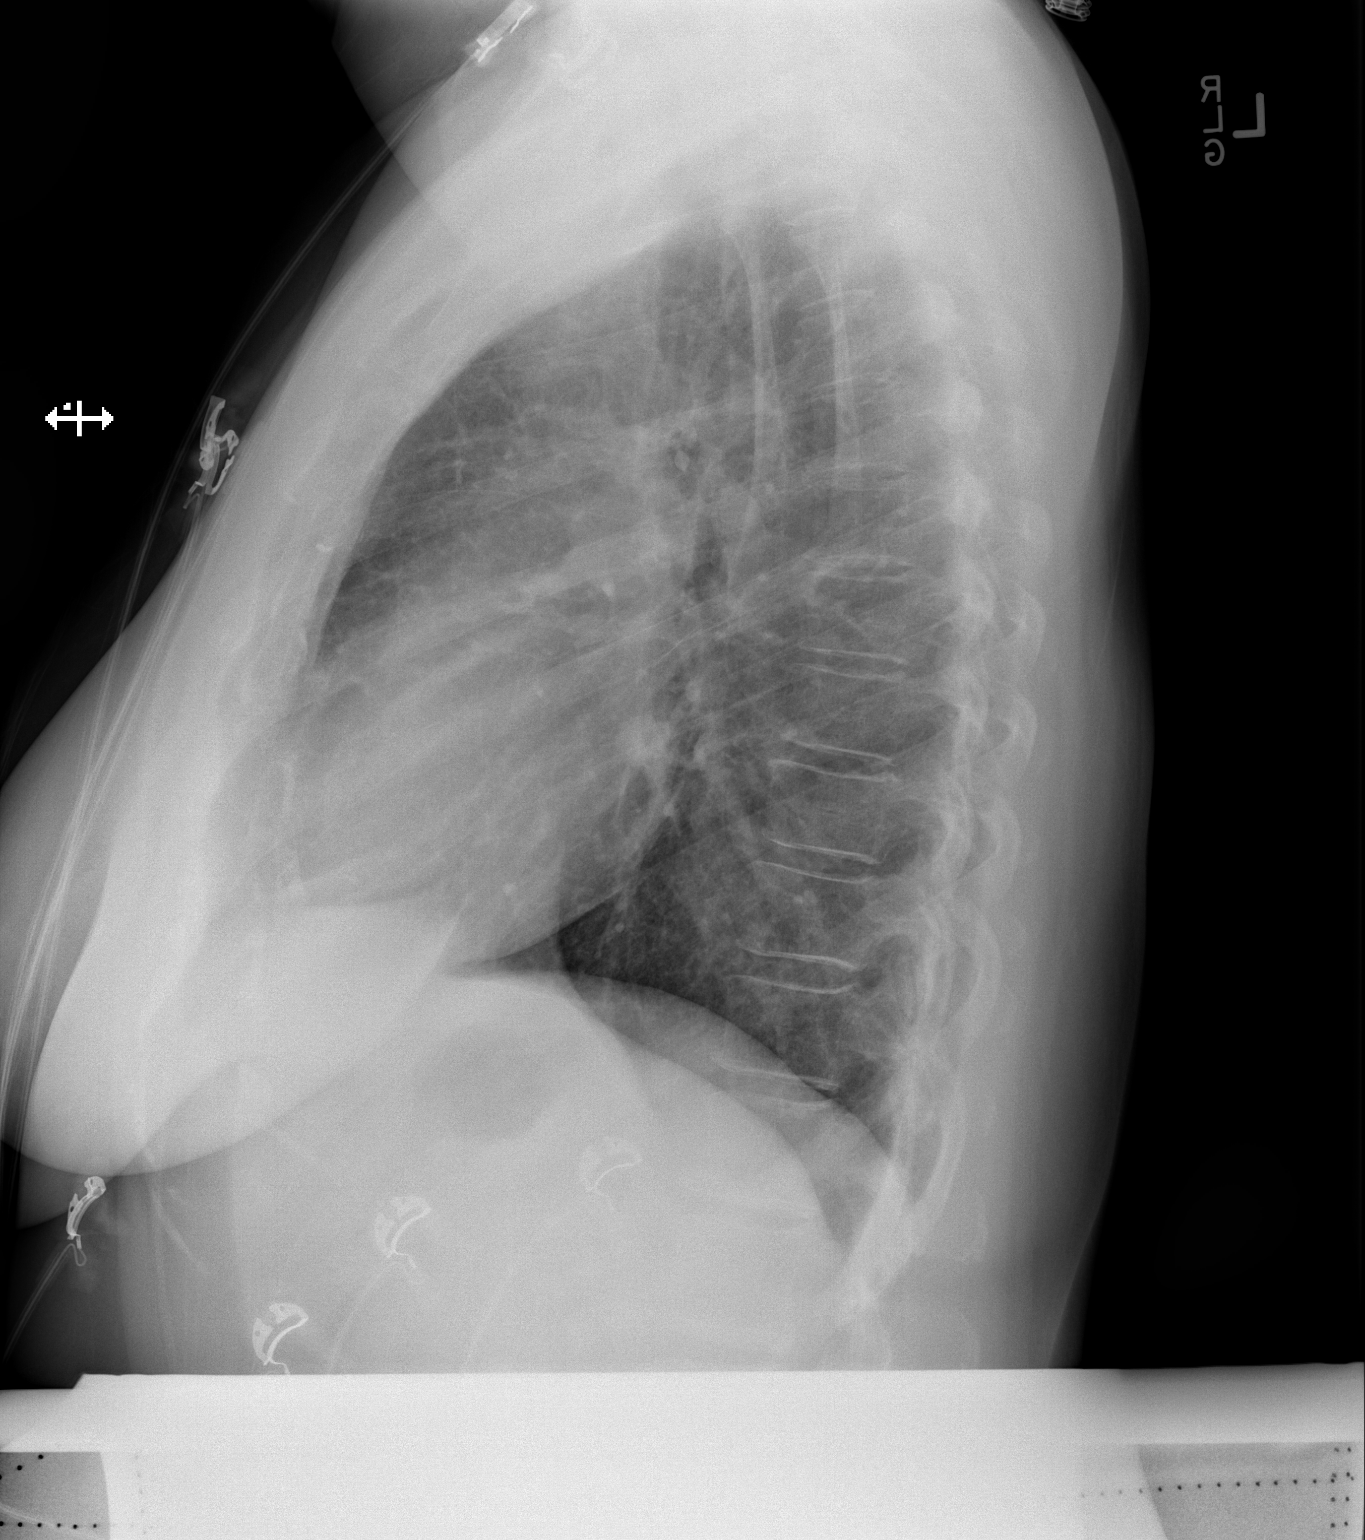

[2 of 2 positions shown; findings below may reference images not displayed]

FINDINGS: Normal heart size, mediastinal contours, and pulmonary vascularity.

Lungs clear.

No pleural effusion or pneumothorax.

Bones unremarkable.
IMPRESSION: Normal exam.

## 2019-09-17 MED FILL — ESTRADIOL 0.0375 MG/24HR PT: 0.0375 | 28 days supply | Qty: 8 | Fill #3

## 2019-10-04 MED FILL — PROGESTERONE 100 MG CAPSULE: 100 | 30 days supply | Qty: 30 | Fill #0

## 2019-10-04 MED FILL — SIMVASTATIN 40 MG TABS: 40 | 90 days supply | Qty: 90 | Fill #0

## 2019-10-21 MED FILL — ESTRADIOL 0.0375 MG/24HR PT: 0.0375 | 28 days supply | Qty: 8 | Fill #4

## 2019-10-31 MED FILL — ESTRADIOL 0.05 MG/24HR PTTW: 0.05 | 28 days supply | Qty: 8 | Fill #0

## 2019-11-05 MED FILL — PROGESTERONE 100 MG CAPSULE: 100 | 30 days supply | Qty: 30 | Fill #1

## 2019-11-10 DIAGNOSIS — Z0100 Encounter for examination of eyes and vision without abnormal findings: Secondary | ICD-10-CM | POA: Diagnosis not present

## 2019-11-19 MED FILL — SERTRALINE HCL 100 MG TAB: 100 | 90 days supply | Qty: 135 | Fill #1

## 2019-12-03 MED FILL — clonazePAM 0.5 MG TABS: 0.5 | 30 days supply | Qty: 30 | Fill #0

## 2019-12-04 MED FILL — PROGESTERONE 100 MG CAPSULE: 100 | 30 days supply | Qty: 30 | Fill #0

## 2019-12-04 MED FILL — ESTRADIOL 0.05 MG/24HR PTTW: 0.05 | 28 days supply | Qty: 8 | Fill #1

## 2019-12-30 MED FILL — ESTRADIOL 0.05 MG/24HR PTTW: 0.05 | 28 days supply | Qty: 8 | Fill #2

## 2019-12-30 MED FILL — PROGESTERONE 100 MG CAPSULE: 100 | 30 days supply | Qty: 30 | Fill #1

## 2020-01-08 MED FILL — SIMVASTATIN 40 MG TABLET: 40 | 90 days supply | Qty: 90 | Fill #0

## 2020-01-29 MED FILL — PROGESTERONE 100 MG CAPS: 100 | 30 days supply | Qty: 30 | Fill #2

## 2020-01-29 MED FILL — ESTRADIOL 0.05 MG/24HR PTTW: 0.05 | 28 days supply | Qty: 8 | Fill #3

## 2020-02-21 DIAGNOSIS — R7303 Prediabetes: Secondary | ICD-10-CM | POA: Diagnosis not present

## 2020-02-21 DIAGNOSIS — J309 Allergic rhinitis, unspecified: Secondary | ICD-10-CM | POA: Diagnosis not present

## 2020-02-21 DIAGNOSIS — E559 Vitamin D deficiency, unspecified: Secondary | ICD-10-CM | POA: Diagnosis not present

## 2020-02-21 DIAGNOSIS — F419 Anxiety disorder, unspecified: Secondary | ICD-10-CM | POA: Diagnosis not present

## 2020-02-21 DIAGNOSIS — Z8619 Personal history of other infectious and parasitic diseases: Secondary | ICD-10-CM | POA: Diagnosis not present

## 2020-02-21 DIAGNOSIS — Z96641 Presence of right artificial hip joint: Secondary | ICD-10-CM | POA: Diagnosis not present

## 2020-02-21 DIAGNOSIS — E782 Mixed hyperlipidemia: Secondary | ICD-10-CM | POA: Diagnosis not present

## 2020-02-21 DIAGNOSIS — Z0000 Encounter for general adult medical examination without abnormal findings: Secondary | ICD-10-CM | POA: Diagnosis not present

## 2020-02-21 DIAGNOSIS — Z23 Encounter for immunization: Secondary | ICD-10-CM | POA: Diagnosis not present

## 2020-02-21 DIAGNOSIS — N951 Menopausal and female climacteric states: Secondary | ICD-10-CM | POA: Diagnosis not present

## 2020-02-21 MED FILL — clonazePAM 0.5 MG TABS: 0.5 | 30 days supply | Qty: 30 | Fill #0

## 2020-02-21 MED FILL — SERTRALINE HCL 100 MG TAB: 100 | 90 days supply | Qty: 90 | Fill #0

## 2020-02-21 MED FILL — VIT D2 1.25 MG (50,000 UNIT: 1.25 MG | 84 days supply | Qty: 12 | Fill #0

## 2020-03-02 MED FILL — ESTRADIOL 0.05 MG/24HR PTTW: 0.05 | 28 days supply | Qty: 8 | Fill #4

## 2020-03-02 MED FILL — PROGESTERONE 100 MG CAPS: 100 | 30 days supply | Qty: 30 | Fill #3

## 2020-03-30 MED FILL — PROGESTERONE 100 MG CAPS: 100 | 30 days supply | Qty: 30 | Fill #4

## 2020-03-30 MED FILL — ESTRADIOL 0.05 MG/24HR PTTW: 0.05 | 28 days supply | Qty: 8 | Fill #5

## 2020-04-12 DIAGNOSIS — J069 Acute upper respiratory infection, unspecified: Secondary | ICD-10-CM | POA: Diagnosis not present

## 2020-04-12 DIAGNOSIS — Z03818 Encounter for observation for suspected exposure to other biological agents ruled out: Secondary | ICD-10-CM | POA: Diagnosis not present

## 2020-04-14 MED FILL — SIMVASTATIN 40 MG TABLET: 40 | 90 days supply | Qty: 90 | Fill #0

## 2020-04-27 MED FILL — ESTRADIOL 0.05 MG/24HR PTTW: 0.05 | 28 days supply | Qty: 8 | Fill #6

## 2020-04-27 MED FILL — PROGESTERONE 100 MG CAPS: 100 | 30 days supply | Qty: 30 | Fill #5

## 2020-05-25 MED FILL — ESTRADIOL 0.05 MG/24HR PTTW: 0.05 | 28 days supply | Qty: 8 | Fill #7

## 2020-05-25 MED FILL — PROGESTERONE 100 MG CAPS: 100 | 30 days supply | Qty: 30 | Fill #6

## 2020-06-23 MED FILL — ESTRADIOL 0.05 MG/24HR PTTW: 0.05 | 28 days supply | Qty: 8 | Fill #8

## 2020-06-23 MED FILL — PROGESTERONE 100 MG CAPS: 100 | 30 days supply | Qty: 30 | Fill #0

## 2020-07-01 MED FILL — SERTRALINE HCL 100 MG TABS: 100 | 90 days supply | Qty: 90 | Fill #1

## 2020-07-10 ENCOUNTER — Other Ambulatory Visit (HOSPITAL_COMMUNITY): Payer: Self-pay | Admitting: Obstetrics and Gynecology

## 2020-07-10 DIAGNOSIS — Z01419 Encounter for gynecological examination (general) (routine) without abnormal findings: Secondary | ICD-10-CM | POA: Diagnosis not present

## 2020-07-10 DIAGNOSIS — Z1231 Encounter for screening mammogram for malignant neoplasm of breast: Secondary | ICD-10-CM | POA: Diagnosis not present

## 2020-07-10 DIAGNOSIS — Z6834 Body mass index (BMI) 34.0-34.9, adult: Secondary | ICD-10-CM | POA: Diagnosis not present

## 2020-07-10 DIAGNOSIS — Z1151 Encounter for screening for human papillomavirus (HPV): Secondary | ICD-10-CM | POA: Diagnosis not present

## 2020-07-13 MED FILL — SIMVASTATIN 40 MG TABLET: 40 | 90 days supply | Qty: 90 | Fill #1

## 2020-07-20 MED FILL — ESTRADIOL 0.05 MG/24HR PTTW: 0.05 | 28 days supply | Qty: 8 | Fill #0

## 2020-07-30 MED FILL — PROGESTERONE 100 MG CAPS: 100 | 30 days supply | Qty: 30 | Fill #0

## 2020-07-30 MED FILL — clonazePAM 0.5 MG TABS: 0.5 | 30 days supply | Qty: 30 | Fill #1

## 2020-08-12 MED FILL — ESTRADIOL 0.05 MG/24HR PTTW: 0.05 | 28 days supply | Qty: 8 | Fill #1

## 2020-08-24 ENCOUNTER — Other Ambulatory Visit (HOSPITAL_COMMUNITY): Payer: Self-pay | Admitting: Family Medicine

## 2020-08-24 DIAGNOSIS — E782 Mixed hyperlipidemia: Secondary | ICD-10-CM | POA: Diagnosis not present

## 2020-08-24 DIAGNOSIS — Z8619 Personal history of other infectious and parasitic diseases: Secondary | ICD-10-CM | POA: Diagnosis not present

## 2020-08-24 DIAGNOSIS — J309 Allergic rhinitis, unspecified: Secondary | ICD-10-CM | POA: Diagnosis not present

## 2020-08-24 DIAGNOSIS — R7303 Prediabetes: Secondary | ICD-10-CM | POA: Diagnosis not present

## 2020-08-24 DIAGNOSIS — E559 Vitamin D deficiency, unspecified: Secondary | ICD-10-CM | POA: Diagnosis not present

## 2020-08-24 DIAGNOSIS — F419 Anxiety disorder, unspecified: Secondary | ICD-10-CM | POA: Diagnosis not present

## 2020-08-24 DIAGNOSIS — Z23 Encounter for immunization: Secondary | ICD-10-CM | POA: Diagnosis not present

## 2020-08-24 DIAGNOSIS — N951 Menopausal and female climacteric states: Secondary | ICD-10-CM | POA: Diagnosis not present

## 2020-08-24 DIAGNOSIS — Z96641 Presence of right artificial hip joint: Secondary | ICD-10-CM | POA: Diagnosis not present

## 2020-08-31 MED FILL — PROGESTERONE 100 MG CAPS: 100 | 30 days supply | Qty: 30 | Fill #1

## 2020-09-14 MED FILL — ESTRADIOL 0.05 MG/24HR PTTW: 0.05 | 28 days supply | Qty: 8 | Fill #2

## 2020-09-29 MED FILL — PROGESTERONE 100 MG CAPS: 100 | 30 days supply | Qty: 30 | Fill #2

## 2020-09-29 MED FILL — SERTRALINE HCL 100 MG TABS: 100 | 90 days supply | Qty: 90 | Fill #0

## 2020-10-13 MED FILL — ESTRADIOL 0.05 MG/24HR PTTW: 0.05 | 28 days supply | Qty: 8 | Fill #3

## 2020-10-13 MED FILL — SIMVASTATIN 40 MG TABLET: 40 | 90 days supply | Qty: 90 | Fill #0

## 2020-10-21 ENCOUNTER — Encounter (HOSPITAL_BASED_OUTPATIENT_CLINIC_OR_DEPARTMENT_OTHER): Payer: Self-pay

## 2020-10-21 ENCOUNTER — Other Ambulatory Visit: Payer: Self-pay

## 2020-10-21 ENCOUNTER — Emergency Department (HOSPITAL_BASED_OUTPATIENT_CLINIC_OR_DEPARTMENT_OTHER)
Admission: EM | Admit: 2020-10-21 | Discharge: 2020-10-21 | Disposition: A | Discharge status: Home / Self Care | Payer: 59 | Attending: Emergency Medicine | Admitting: Emergency Medicine

## 2020-10-21 DIAGNOSIS — Z96641 Presence of right artificial hip joint: Secondary | ICD-10-CM | POA: Insufficient documentation

## 2020-10-21 DIAGNOSIS — R079 Chest pain, unspecified: Secondary | ICD-10-CM | POA: Diagnosis present

## 2020-10-21 DIAGNOSIS — Z87891 Personal history of nicotine dependence: Secondary | ICD-10-CM | POA: Insufficient documentation

## 2020-10-21 DIAGNOSIS — R0789 Other chest pain: Secondary | ICD-10-CM | POA: Insufficient documentation

## 2020-10-21 LAB — CBC
HCT: 40.9 % (ref 36.0–46.0)
Hemoglobin: 12.5 g/dL (ref 12.0–15.0)
MCH: 27.4 pg (ref 26.0–34.0)
MCHC: 30.6 g/dL (ref 30.0–36.0)
MCV: 89.5 fL (ref 80.0–100.0)
Platelets: 175 10*3/uL (ref 150–400)
RBC: 4.57 MIL/uL (ref 3.87–5.11)
RDW: 14.4 % (ref 11.5–15.5)
WBC: 8.6 10*3/uL (ref 4.0–10.5)
nRBC: 0 % (ref 0.0–0.2)

## 2020-10-21 LAB — BASIC METABOLIC PANEL
Anion gap: 11 (ref 5–15)
BUN: 20 mg/dL (ref 6–20)
CO2: 23 mmol/L (ref 22–32)
Calcium: 9.6 mg/dL (ref 8.9–10.3)
Chloride: 102 mmol/L (ref 98–111)
Creatinine, Ser: 1.02 mg/dL — ABNORMAL HIGH (ref 0.44–1.00)
GFR, Estimated: >60 mL/min (ref >60)
Glucose, Bld: 86 mg/dL (ref 70–99)
Potassium: 4.8 mmol/L (ref 3.5–5.1)
Sodium: 136 mmol/L (ref 135–145)

## 2020-10-21 LAB — TROPONIN I (HIGH SENSITIVITY): Troponin I (High Sensitivity): 3 ng/L (ref <18)

## 2020-10-21 NOTE — ED Provider Notes (Signed)
[facility] EMERGENCY DEPARTMENT Provider Note   CSN: [id number] Arrival date & time: 10/21/20  1338     History Chief Complaint  Patient presents with  . Chest Pain    Whitney Davenport is a 55 y.o. female.  The history is provided by the patient.  Chest Pain  Whitney Davenport is a 55 y.o. female who presents to the Emergency Department complaining of chest pain. She presents the emergency department complaining of left sided chest and left upper and lower arm pain. Pain is described as squeezing in nature. It is intermittent with no clear alleviating or worsening factors. Pain began yesterday. Episodes last a few seconds at a time. She denies any fevers, shortness of breath, cough, nausea, vomiting, diaphoresis. She does report mild increased fatigue over the last week. She has experience similar episodes in the past secondary to anxiety. She takes estrogen. She does not smoke. No history of DVT/PE. No lower extremity or upper extremity edema or pain. No recent surgery or travel.    Past Medical History:  Diagnosis Date  . Allergy   . Anemia    hx of  . Anxiety   . Back injury    "ruptured disc-no problems since surgery"  . Back pain   . Constipation   . Depression   . GERD (gastroesophageal reflux disease)    occ  . Hepatitis 2004   hx of hep c-"interferon treatment, does not show up on blood test anymore"  . History of kidney stones   . Hyperlipidemia   . Hypotension    no fainting in years  . Infertility, female   . Lumbar disc disease   . Osteoarthritis   . Vitamin D deficiency     Patient Active Problem List   Diagnosis Date Noted  . Class 1 obesity without serious comorbidity with body mass index (BMI) of 30.0 to 30.9 in adult 03/15/2017  . Class 1 obesity without serious comorbidity with body mass index (BMI) of 32.0 to 32.9 in adult 01/17/2017  . Prediabetes 01/17/2017  . Shortness of breath on exertion 01/02/2017  . Hyperlipidemia 01/02/2017   . Vitamin D deficiency 01/02/2017  . Obese 02/04/2015  . S/P right THA, AA 02/03/2015    Past Surgical History:  Procedure Laterality Date  . DIAGNOSTIC LAPAROSCOPY  last 2001   for endometriosis, couple of surgeries  . GANGLION CYST EXCISION  age 36  . LUMBAR LAMINECTOMY/DECOMPRESSION MICRODISCECTOMY  11/03/2011   Procedure: LUMBAR LAMINECTOMY/DECOMPRESSION MICRODISCECTOMY;  Surgeon: Hewitt Shorts, MD;  Location: [facility];  Service: Neurosurgery;  Laterality: Left;  LEFT Lumbar five sacral one laminotomy and microdiskectomy  . TOTAL HIP ARTHROPLASTY Right 02/03/2015   Procedure: RIGHT TOTAL HIP ARTHROPLASTY ANTERIOR APPROACH;  Surgeon: Durene Romans, MD;  Location: [facility];  Service: Orthopedics;  Laterality: Right;     OB History    Gravida  0   Para  0   Term  0   Preterm  0   AB  0   Living  0     SAB  0   IAB  0   Ectopic  0   Multiple  0   Live Births  0           Family History  Problem Relation Age of Onset  . Thyroid disease Mother   . Diabetes Father   . Hyperlipidemia Father   . Obesity Father   . Cancer Maternal Grandmother   . Colon cancer Neg  Hx     Social History   Tobacco Use  . Smoking status: Former Smoker    Years: 15.00    Types: Cigarettes    Quit date: 10/31/2010    Years since quitting: 9.9  . Smokeless tobacco: Never Used  Vaping Use  . Vaping Use: Never used  Substance Use Topics  . Alcohol use: Yes    Comment: rare  . Drug use: No    Home Medications Prior to Admission medications   Medication Sig Start Date End Date Taking? Authorizing Provider  cetirizine (ZYRTEC) 10 MG tablet Take 10 mg by mouth at bedtime.    Yes [provider]  clonazePAM (KLONOPIN) 0.5 MG tablet Take 0.5 mg by mouth 2 (two) times daily as needed for anxiety.   Yes [provider]  estradiol (VIVELLE-DOT) 0.075 MG/24HR Place 1 patch onto the skin 2 (two) times a week.   Yes [provider]  progesterone  (PROMETRIUM) 100 MG capsule Take 100 mg by mouth at bedtime.   Yes [provider]  sertraline (ZOLOFT) 100 MG tablet Take 100 mg by mouth at bedtime.    Yes [provider]  simvastatin (ZOCOR) 40 MG tablet Take 40 mg by mouth at bedtime.   Yes [provider]  metFORMIN (GLUCOPHAGE) 500 MG tablet Take 1 tablet (500 mg total) by mouth every morning. Patient not taking: No sig reported 06/06/18   Langston Reusing, MD    Allergies    Sulfa antibiotics, Hydrocodone, and Zithromax [azithromycin]  Review of Systems   Review of Systems  Cardiovascular: Positive for chest pain.  All other systems reviewed and are negative.   Physical Exam Updated Vital Signs BP 118/82 (BP Location: Right Arm)   Pulse 74   Temp 98.2 F (36.8 C) (Oral)   Resp 20   SpO2 100%   Physical Exam Vitals and nursing note reviewed.  Constitutional:      Appearance: She is well-developed and well-nourished.  HENT:     Head: Normocephalic and atraumatic.  Cardiovascular:     Rate and Rhythm: Normal rate and regular rhythm.     Heart sounds: No murmur heard.   Pulmonary:     Effort: Pulmonary effort is normal. No respiratory distress.     Breath sounds: Normal breath sounds.  Abdominal:     Palpations: Abdomen is soft.     Tenderness: There is no abdominal tenderness. There is no guarding or rebound.  Musculoskeletal:        General: No swelling, tenderness or edema.     Comments: 2+ radial pulses bilaterally  Skin:    General: Skin is warm and dry.  Neurological:     Mental Status: She is alert and oriented to person, place, and time.  Psychiatric:        Mood and Affect: Mood and affect normal.        Behavior: Behavior normal.     ED Results / Procedures / Treatments   Labs (all labs ordered are listed, but only abnormal results are displayed) Labs Reviewed  BASIC METABOLIC PANEL - Abnormal; Notable for the following components:      Result Value   Creatinine,  Ser 1.02 (*)    All other components within normal limits  CBC  TROPONIN I (HIGH SENSITIVITY)  TROPONIN I (HIGH SENSITIVITY)    EKG EKG Interpretation  Date/Time:  Wednesday October 21 2020 13:43:43 EST Ventricular Rate:  82 PR Interval:  130 QRS Duration: 80  QT Interval:  352 QTC Calculation: 411 R Axis:   78 Text Interpretation: Normal sinus rhythm Normal ECG Confirmed by Tilden Fossa [id number]) on 10/21/2020 5:04:13 PM   Radiology No results found.  Procedures Procedures (including critical care time)  Medications Ordered in ED Medications - No data to display  ED Course  I have reviewed the triage vital signs and the nursing notes.  Pertinent labs & imaging results that were available during my care of the patient were reviewed by me and considered in my medical decision making (see chart for details).    MDM Rules/Calculators/A&P                         Patient here for evaluation of two days of left sided chest and arm pain. Pain is not reproducible on examination. EKG is without acute ischemic changes. Troponin is negative. Presentation is not consistent with ACS, PE, dissection. Discussed with patient unclear source of pain, question degree of anxiety. Recommend further workup with PCP. Return precautions discussed.  Final Clinical Impression(s) / ED Diagnoses Final diagnoses:  Atypical chest pain    Rx / DC Orders ED Discharge Orders    None       Tilden Fossa, MD 10/21/20 [phone]

## 2020-10-21 NOTE — ED Notes (Addendum)
Pt here with complaints of a squeezing sensation in the left arm that is intermittent and some mild tingling in the fingers at times. Chest pressure started today after the squeezing of the arm followed by dizziness. Pt states she just haven't felt like herself for the past week.

## 2020-10-21 NOTE — ED Triage Notes (Addendum)
Pt c/o day 2 of CP-denies fever/flu sx -NAD-to triage in w/c

## 2020-10-22 MED FILL — clonazePAM 0.5 MG TABS: 0.5 | 30 days supply | Qty: 30 | Fill #0

## 2020-11-02 MED FILL — PROGESTERONE 100 MG CAPS: 100 | 30 days supply | Qty: 30 | Fill #3

## 2020-11-04 MED FILL — ESTRADIOL 0.05 MG/24HR PTTW: 0.05 | 28 days supply | Qty: 8 | Fill #4

## 2020-11-23 DIAGNOSIS — M9903 Segmental and somatic dysfunction of lumbar region: Secondary | ICD-10-CM | POA: Diagnosis not present

## 2020-11-23 DIAGNOSIS — M9905 Segmental and somatic dysfunction of pelvic region: Secondary | ICD-10-CM | POA: Diagnosis not present

## 2020-11-23 DIAGNOSIS — M9902 Segmental and somatic dysfunction of thoracic region: Secondary | ICD-10-CM | POA: Diagnosis not present

## 2020-11-23 DIAGNOSIS — M9901 Segmental and somatic dysfunction of cervical region: Secondary | ICD-10-CM | POA: Diagnosis not present

## 2020-11-23 DIAGNOSIS — M531 Cervicobrachial syndrome: Secondary | ICD-10-CM | POA: Diagnosis not present

## 2020-11-23 DIAGNOSIS — M5032 Other cervical disc degeneration, mid-cervical region, unspecified level: Secondary | ICD-10-CM | POA: Diagnosis not present

## 2020-11-23 DIAGNOSIS — M5386 Other specified dorsopathies, lumbar region: Secondary | ICD-10-CM | POA: Diagnosis not present

## 2020-11-23 DIAGNOSIS — M62830 Muscle spasm of back: Secondary | ICD-10-CM | POA: Diagnosis not present

## 2020-11-25 DIAGNOSIS — M5386 Other specified dorsopathies, lumbar region: Secondary | ICD-10-CM | POA: Diagnosis not present

## 2020-11-25 DIAGNOSIS — M9901 Segmental and somatic dysfunction of cervical region: Secondary | ICD-10-CM | POA: Diagnosis not present

## 2020-11-25 DIAGNOSIS — M9902 Segmental and somatic dysfunction of thoracic region: Secondary | ICD-10-CM | POA: Diagnosis not present

## 2020-11-25 DIAGNOSIS — M531 Cervicobrachial syndrome: Secondary | ICD-10-CM | POA: Diagnosis not present

## 2020-11-25 DIAGNOSIS — M9905 Segmental and somatic dysfunction of pelvic region: Secondary | ICD-10-CM | POA: Diagnosis not present

## 2020-11-25 DIAGNOSIS — M9903 Segmental and somatic dysfunction of lumbar region: Secondary | ICD-10-CM | POA: Diagnosis not present

## 2020-11-25 DIAGNOSIS — M62830 Muscle spasm of back: Secondary | ICD-10-CM | POA: Diagnosis not present

## 2020-11-25 DIAGNOSIS — M5032 Other cervical disc degeneration, mid-cervical region, unspecified level: Secondary | ICD-10-CM | POA: Diagnosis not present

## 2020-11-27 DIAGNOSIS — M62830 Muscle spasm of back: Secondary | ICD-10-CM | POA: Diagnosis not present

## 2020-11-27 DIAGNOSIS — M9905 Segmental and somatic dysfunction of pelvic region: Secondary | ICD-10-CM | POA: Diagnosis not present

## 2020-11-27 DIAGNOSIS — M9902 Segmental and somatic dysfunction of thoracic region: Secondary | ICD-10-CM | POA: Diagnosis not present

## 2020-11-27 DIAGNOSIS — M5386 Other specified dorsopathies, lumbar region: Secondary | ICD-10-CM | POA: Diagnosis not present

## 2020-11-27 DIAGNOSIS — M9901 Segmental and somatic dysfunction of cervical region: Secondary | ICD-10-CM | POA: Diagnosis not present

## 2020-11-27 DIAGNOSIS — M531 Cervicobrachial syndrome: Secondary | ICD-10-CM | POA: Diagnosis not present

## 2020-11-27 DIAGNOSIS — M9903 Segmental and somatic dysfunction of lumbar region: Secondary | ICD-10-CM | POA: Diagnosis not present

## 2020-11-27 DIAGNOSIS — M5032 Other cervical disc degeneration, mid-cervical region, unspecified level: Secondary | ICD-10-CM | POA: Diagnosis not present

## 2020-12-02 DIAGNOSIS — M9905 Segmental and somatic dysfunction of pelvic region: Secondary | ICD-10-CM | POA: Diagnosis not present

## 2020-12-02 DIAGNOSIS — M5386 Other specified dorsopathies, lumbar region: Secondary | ICD-10-CM | POA: Diagnosis not present

## 2020-12-02 DIAGNOSIS — M5032 Other cervical disc degeneration, mid-cervical region, unspecified level: Secondary | ICD-10-CM | POA: Diagnosis not present

## 2020-12-02 DIAGNOSIS — M9903 Segmental and somatic dysfunction of lumbar region: Secondary | ICD-10-CM | POA: Diagnosis not present

## 2020-12-02 DIAGNOSIS — M9901 Segmental and somatic dysfunction of cervical region: Secondary | ICD-10-CM | POA: Diagnosis not present

## 2020-12-02 DIAGNOSIS — M9902 Segmental and somatic dysfunction of thoracic region: Secondary | ICD-10-CM | POA: Diagnosis not present

## 2020-12-02 DIAGNOSIS — M531 Cervicobrachial syndrome: Secondary | ICD-10-CM | POA: Diagnosis not present

## 2020-12-02 DIAGNOSIS — M62830 Muscle spasm of back: Secondary | ICD-10-CM | POA: Diagnosis not present

## 2020-12-03 DIAGNOSIS — M9903 Segmental and somatic dysfunction of lumbar region: Secondary | ICD-10-CM | POA: Diagnosis not present

## 2020-12-03 DIAGNOSIS — M531 Cervicobrachial syndrome: Secondary | ICD-10-CM | POA: Diagnosis not present

## 2020-12-03 DIAGNOSIS — M9905 Segmental and somatic dysfunction of pelvic region: Secondary | ICD-10-CM | POA: Diagnosis not present

## 2020-12-03 DIAGNOSIS — M62830 Muscle spasm of back: Secondary | ICD-10-CM | POA: Diagnosis not present

## 2020-12-03 DIAGNOSIS — M5032 Other cervical disc degeneration, mid-cervical region, unspecified level: Secondary | ICD-10-CM | POA: Diagnosis not present

## 2020-12-03 DIAGNOSIS — M5386 Other specified dorsopathies, lumbar region: Secondary | ICD-10-CM | POA: Diagnosis not present

## 2020-12-03 DIAGNOSIS — M9901 Segmental and somatic dysfunction of cervical region: Secondary | ICD-10-CM | POA: Diagnosis not present

## 2020-12-03 DIAGNOSIS — M9902 Segmental and somatic dysfunction of thoracic region: Secondary | ICD-10-CM | POA: Diagnosis not present

## 2020-12-03 MED FILL — PROGESTERONE 100 MG CAPS: 100 | 30 days supply | Qty: 30 | Fill #4

## 2020-12-07 MED FILL — ESTRADIOL 0.05 MG/24HR PTTW: 0.05 | 28 days supply | Qty: 8 | Fill #5

## 2020-12-14 DIAGNOSIS — M531 Cervicobrachial syndrome: Secondary | ICD-10-CM | POA: Diagnosis not present

## 2020-12-14 DIAGNOSIS — M5032 Other cervical disc degeneration, mid-cervical region, unspecified level: Secondary | ICD-10-CM | POA: Diagnosis not present

## 2020-12-14 DIAGNOSIS — M9902 Segmental and somatic dysfunction of thoracic region: Secondary | ICD-10-CM | POA: Diagnosis not present

## 2020-12-14 DIAGNOSIS — M5386 Other specified dorsopathies, lumbar region: Secondary | ICD-10-CM | POA: Diagnosis not present

## 2020-12-14 DIAGNOSIS — M62830 Muscle spasm of back: Secondary | ICD-10-CM | POA: Diagnosis not present

## 2020-12-14 DIAGNOSIS — M9903 Segmental and somatic dysfunction of lumbar region: Secondary | ICD-10-CM | POA: Diagnosis not present

## 2020-12-14 DIAGNOSIS — M9901 Segmental and somatic dysfunction of cervical region: Secondary | ICD-10-CM | POA: Diagnosis not present

## 2020-12-14 DIAGNOSIS — M9905 Segmental and somatic dysfunction of pelvic region: Secondary | ICD-10-CM | POA: Diagnosis not present

## 2020-12-16 DIAGNOSIS — M9905 Segmental and somatic dysfunction of pelvic region: Secondary | ICD-10-CM | POA: Diagnosis not present

## 2020-12-16 DIAGNOSIS — M62830 Muscle spasm of back: Secondary | ICD-10-CM | POA: Diagnosis not present

## 2020-12-16 DIAGNOSIS — M5032 Other cervical disc degeneration, mid-cervical region, unspecified level: Secondary | ICD-10-CM | POA: Diagnosis not present

## 2020-12-16 DIAGNOSIS — M9903 Segmental and somatic dysfunction of lumbar region: Secondary | ICD-10-CM | POA: Diagnosis not present

## 2020-12-16 DIAGNOSIS — M531 Cervicobrachial syndrome: Secondary | ICD-10-CM | POA: Diagnosis not present

## 2020-12-16 DIAGNOSIS — M9902 Segmental and somatic dysfunction of thoracic region: Secondary | ICD-10-CM | POA: Diagnosis not present

## 2020-12-16 DIAGNOSIS — M5386 Other specified dorsopathies, lumbar region: Secondary | ICD-10-CM | POA: Diagnosis not present

## 2020-12-16 DIAGNOSIS — M9901 Segmental and somatic dysfunction of cervical region: Secondary | ICD-10-CM | POA: Diagnosis not present

## 2020-12-21 DIAGNOSIS — M531 Cervicobrachial syndrome: Secondary | ICD-10-CM | POA: Diagnosis not present

## 2020-12-21 DIAGNOSIS — M9901 Segmental and somatic dysfunction of cervical region: Secondary | ICD-10-CM | POA: Diagnosis not present

## 2020-12-21 DIAGNOSIS — M9902 Segmental and somatic dysfunction of thoracic region: Secondary | ICD-10-CM | POA: Diagnosis not present

## 2020-12-21 DIAGNOSIS — M9905 Segmental and somatic dysfunction of pelvic region: Secondary | ICD-10-CM | POA: Diagnosis not present

## 2020-12-21 DIAGNOSIS — M5386 Other specified dorsopathies, lumbar region: Secondary | ICD-10-CM | POA: Diagnosis not present

## 2020-12-21 DIAGNOSIS — M62830 Muscle spasm of back: Secondary | ICD-10-CM | POA: Diagnosis not present

## 2020-12-21 DIAGNOSIS — M9903 Segmental and somatic dysfunction of lumbar region: Secondary | ICD-10-CM | POA: Diagnosis not present

## 2020-12-21 DIAGNOSIS — M5032 Other cervical disc degeneration, mid-cervical region, unspecified level: Secondary | ICD-10-CM | POA: Diagnosis not present

## 2020-12-23 DIAGNOSIS — F331 Major depressive disorder, recurrent, moderate: Secondary | ICD-10-CM | POA: Diagnosis not present

## 2020-12-23 DIAGNOSIS — F900 Attention-deficit hyperactivity disorder, predominantly inattentive type: Secondary | ICD-10-CM | POA: Diagnosis not present

## 2020-12-28 DIAGNOSIS — M9903 Segmental and somatic dysfunction of lumbar region: Secondary | ICD-10-CM | POA: Diagnosis not present

## 2020-12-28 DIAGNOSIS — M62830 Muscle spasm of back: Secondary | ICD-10-CM | POA: Diagnosis not present

## 2020-12-28 DIAGNOSIS — M5032 Other cervical disc degeneration, mid-cervical region, unspecified level: Secondary | ICD-10-CM | POA: Diagnosis not present

## 2020-12-28 DIAGNOSIS — M9902 Segmental and somatic dysfunction of thoracic region: Secondary | ICD-10-CM | POA: Diagnosis not present

## 2020-12-28 DIAGNOSIS — M531 Cervicobrachial syndrome: Secondary | ICD-10-CM | POA: Diagnosis not present

## 2020-12-28 DIAGNOSIS — M9901 Segmental and somatic dysfunction of cervical region: Secondary | ICD-10-CM | POA: Diagnosis not present

## 2020-12-28 DIAGNOSIS — M9905 Segmental and somatic dysfunction of pelvic region: Secondary | ICD-10-CM | POA: Diagnosis not present

## 2020-12-28 DIAGNOSIS — M5386 Other specified dorsopathies, lumbar region: Secondary | ICD-10-CM | POA: Diagnosis not present

## 2020-12-30 DIAGNOSIS — M531 Cervicobrachial syndrome: Secondary | ICD-10-CM | POA: Diagnosis not present

## 2020-12-30 DIAGNOSIS — M5032 Other cervical disc degeneration, mid-cervical region, unspecified level: Secondary | ICD-10-CM | POA: Diagnosis not present

## 2020-12-30 DIAGNOSIS — M9901 Segmental and somatic dysfunction of cervical region: Secondary | ICD-10-CM | POA: Diagnosis not present

## 2020-12-30 DIAGNOSIS — M9903 Segmental and somatic dysfunction of lumbar region: Secondary | ICD-10-CM | POA: Diagnosis not present

## 2020-12-30 DIAGNOSIS — M9905 Segmental and somatic dysfunction of pelvic region: Secondary | ICD-10-CM | POA: Diagnosis not present

## 2020-12-30 DIAGNOSIS — M5386 Other specified dorsopathies, lumbar region: Secondary | ICD-10-CM | POA: Diagnosis not present

## 2020-12-30 DIAGNOSIS — M9902 Segmental and somatic dysfunction of thoracic region: Secondary | ICD-10-CM | POA: Diagnosis not present

## 2020-12-30 DIAGNOSIS — M62830 Muscle spasm of back: Secondary | ICD-10-CM | POA: Diagnosis not present

## 2021-01-04 ENCOUNTER — Other Ambulatory Visit (HOSPITAL_BASED_OUTPATIENT_CLINIC_OR_DEPARTMENT_OTHER): Payer: Self-pay

## 2021-01-04 ENCOUNTER — Other Ambulatory Visit (HOSPITAL_COMMUNITY): Payer: Self-pay

## 2021-01-04 DIAGNOSIS — M531 Cervicobrachial syndrome: Secondary | ICD-10-CM | POA: Diagnosis not present

## 2021-01-04 DIAGNOSIS — M9903 Segmental and somatic dysfunction of lumbar region: Secondary | ICD-10-CM | POA: Diagnosis not present

## 2021-01-04 DIAGNOSIS — M5032 Other cervical disc degeneration, mid-cervical region, unspecified level: Secondary | ICD-10-CM | POA: Diagnosis not present

## 2021-01-04 DIAGNOSIS — M9901 Segmental and somatic dysfunction of cervical region: Secondary | ICD-10-CM | POA: Diagnosis not present

## 2021-01-04 DIAGNOSIS — M9902 Segmental and somatic dysfunction of thoracic region: Secondary | ICD-10-CM | POA: Diagnosis not present

## 2021-01-04 DIAGNOSIS — M62830 Muscle spasm of back: Secondary | ICD-10-CM | POA: Diagnosis not present

## 2021-01-04 DIAGNOSIS — M5386 Other specified dorsopathies, lumbar region: Secondary | ICD-10-CM | POA: Diagnosis not present

## 2021-01-04 DIAGNOSIS — M9905 Segmental and somatic dysfunction of pelvic region: Secondary | ICD-10-CM | POA: Diagnosis not present

## 2021-01-04 MED FILL — SERTRALINE HCL 100 MG TABS: 100 | 90 days supply | Qty: 90 | Fill #1

## 2021-01-04 MED FILL — PROGESTERONE 100 MG CAPS: 100 | 30 days supply | Qty: 30 | Fill #5

## 2021-01-04 MED FILL — ESTRADIOL 0.05 MG/24HR PTTW: 0.05 | 28 days supply | Qty: 8 | Fill #6

## 2021-01-08 DIAGNOSIS — M9902 Segmental and somatic dysfunction of thoracic region: Secondary | ICD-10-CM | POA: Diagnosis not present

## 2021-01-08 DIAGNOSIS — M9905 Segmental and somatic dysfunction of pelvic region: Secondary | ICD-10-CM | POA: Diagnosis not present

## 2021-01-08 DIAGNOSIS — M5386 Other specified dorsopathies, lumbar region: Secondary | ICD-10-CM | POA: Diagnosis not present

## 2021-01-08 DIAGNOSIS — M62830 Muscle spasm of back: Secondary | ICD-10-CM | POA: Diagnosis not present

## 2021-01-08 DIAGNOSIS — M9903 Segmental and somatic dysfunction of lumbar region: Secondary | ICD-10-CM | POA: Diagnosis not present

## 2021-01-08 DIAGNOSIS — M9901 Segmental and somatic dysfunction of cervical region: Secondary | ICD-10-CM | POA: Diagnosis not present

## 2021-01-08 DIAGNOSIS — M5032 Other cervical disc degeneration, mid-cervical region, unspecified level: Secondary | ICD-10-CM | POA: Diagnosis not present

## 2021-01-08 DIAGNOSIS — M531 Cervicobrachial syndrome: Secondary | ICD-10-CM | POA: Diagnosis not present

## 2021-01-08 MED FILL — SIMVASTATIN 40 MG TABLET: 40 | 90 days supply | Qty: 90 | Fill #1

## 2021-01-20 DIAGNOSIS — M5386 Other specified dorsopathies, lumbar region: Secondary | ICD-10-CM | POA: Diagnosis not present

## 2021-01-20 DIAGNOSIS — M5032 Other cervical disc degeneration, mid-cervical region, unspecified level: Secondary | ICD-10-CM | POA: Diagnosis not present

## 2021-01-20 DIAGNOSIS — M9901 Segmental and somatic dysfunction of cervical region: Secondary | ICD-10-CM | POA: Diagnosis not present

## 2021-01-20 DIAGNOSIS — M9905 Segmental and somatic dysfunction of pelvic region: Secondary | ICD-10-CM | POA: Diagnosis not present

## 2021-01-20 DIAGNOSIS — M9902 Segmental and somatic dysfunction of thoracic region: Secondary | ICD-10-CM | POA: Diagnosis not present

## 2021-01-20 DIAGNOSIS — M9903 Segmental and somatic dysfunction of lumbar region: Secondary | ICD-10-CM | POA: Diagnosis not present

## 2021-01-20 DIAGNOSIS — M62830 Muscle spasm of back: Secondary | ICD-10-CM | POA: Diagnosis not present

## 2021-01-20 DIAGNOSIS — M531 Cervicobrachial syndrome: Secondary | ICD-10-CM | POA: Diagnosis not present

## 2021-02-02 ENCOUNTER — Other Ambulatory Visit (HOSPITAL_COMMUNITY): Payer: Self-pay

## 2021-02-02 MED FILL — Progesterone Cap 100 MG: ORAL | 30 days supply | Qty: 30 | Fill #0 | Status: AC

## 2021-02-02 MED FILL — Estradiol TD Patch Twice Weekly 0.05 MG/24HR: TRANSDERMAL | 28 days supply | Qty: 8 | Fill #0 | Status: AC

## 2021-03-05 ENCOUNTER — Other Ambulatory Visit (HOSPITAL_COMMUNITY): Payer: Self-pay

## 2021-03-05 DIAGNOSIS — Z96641 Presence of right artificial hip joint: Secondary | ICD-10-CM | POA: Diagnosis not present

## 2021-03-05 DIAGNOSIS — F419 Anxiety disorder, unspecified: Secondary | ICD-10-CM | POA: Diagnosis not present

## 2021-03-05 DIAGNOSIS — Z0000 Encounter for general adult medical examination without abnormal findings: Secondary | ICD-10-CM | POA: Diagnosis not present

## 2021-03-05 DIAGNOSIS — E559 Vitamin D deficiency, unspecified: Secondary | ICD-10-CM | POA: Diagnosis not present

## 2021-03-05 DIAGNOSIS — R7303 Prediabetes: Secondary | ICD-10-CM | POA: Diagnosis not present

## 2021-03-05 DIAGNOSIS — N1831 Chronic kidney disease, stage 3a: Secondary | ICD-10-CM | POA: Diagnosis not present

## 2021-03-05 DIAGNOSIS — J309 Allergic rhinitis, unspecified: Secondary | ICD-10-CM | POA: Diagnosis not present

## 2021-03-05 DIAGNOSIS — N951 Menopausal and female climacteric states: Secondary | ICD-10-CM | POA: Diagnosis not present

## 2021-03-05 DIAGNOSIS — E782 Mixed hyperlipidemia: Secondary | ICD-10-CM | POA: Diagnosis not present

## 2021-03-05 MED ORDER — SERTRALINE HCL 100 MG PO TABS
ORAL_TABLET | ORAL | 1 refills | Status: DC
  Filled 2021-03-05 – 2021-04-02 (×2): qty 90, 90d supply, fill #0
  Filled 2021-06-29: qty 90, 90d supply, fill #1

## 2021-03-05 MED ORDER — SIMVASTATIN 40 MG PO TABS
ORAL_TABLET | ORAL | 1 refills | Status: DC
  Filled 2021-03-05 – 2021-04-12 (×2): qty 90, 90d supply, fill #0
  Filled 2021-07-12: qty 90, 90d supply, fill #1

## 2021-03-05 MED ORDER — CLONAZEPAM 0.5 MG PO TABS
ORAL_TABLET | ORAL | 2 refills | Status: DC
  Filled 2021-03-05: qty 30, 30d supply, fill #0
  Filled 2021-07-15: qty 30, 30d supply, fill #1

## 2021-03-05 MED ORDER — ERGOCALCIFEROL 1.25 MG (50000 UT) PO CAPS
50000.0000 [IU] | ORAL_CAPSULE | ORAL | 1 refills | Status: DC
  Filled 2021-03-05: qty 13, 90d supply, fill #0

## 2021-03-05 MED FILL — Estradiol TD Patch Twice Weekly 0.05 MG/24HR: TRANSDERMAL | 28 days supply | Qty: 8 | Fill #1 | Status: AC

## 2021-03-05 MED FILL — Progesterone Cap 100 MG: ORAL | 30 days supply | Qty: 30 | Fill #1 | Status: AC

## 2021-03-06 ENCOUNTER — Other Ambulatory Visit (HOSPITAL_COMMUNITY): Payer: Self-pay

## 2021-03-08 ENCOUNTER — Other Ambulatory Visit (HOSPITAL_COMMUNITY): Payer: Self-pay

## 2021-04-02 ENCOUNTER — Other Ambulatory Visit (HOSPITAL_COMMUNITY): Payer: Self-pay

## 2021-04-02 MED FILL — Estradiol TD Patch Twice Weekly 0.05 MG/24HR: TRANSDERMAL | 28 days supply | Qty: 8 | Fill #2 | Status: AC

## 2021-04-02 MED FILL — Progesterone Cap 100 MG: ORAL | 30 days supply | Qty: 30 | Fill #2 | Status: AC

## 2021-04-12 ENCOUNTER — Other Ambulatory Visit (HOSPITAL_COMMUNITY): Payer: Self-pay

## 2021-04-28 ENCOUNTER — Other Ambulatory Visit (HOSPITAL_COMMUNITY): Payer: Self-pay

## 2021-04-28 MED FILL — Estradiol TD Patch Twice Weekly 0.05 MG/24HR: TRANSDERMAL | 28 days supply | Qty: 8 | Fill #3 | Status: AC

## 2021-05-08 ENCOUNTER — Other Ambulatory Visit (HOSPITAL_COMMUNITY): Payer: Self-pay

## 2021-05-08 MED FILL — Progesterone Cap 100 MG: ORAL | 30 days supply | Qty: 30 | Fill #3 | Status: AC

## 2021-05-11 ENCOUNTER — Other Ambulatory Visit (HOSPITAL_BASED_OUTPATIENT_CLINIC_OR_DEPARTMENT_OTHER): Payer: Self-pay

## 2021-05-11 MED ORDER — PROMETHAZINE HCL 25 MG PO TABS
ORAL_TABLET | ORAL | 0 refills | Status: DC
  Filled 2021-05-11: qty 20, 5d supply, fill #0

## 2021-05-28 ENCOUNTER — Other Ambulatory Visit (HOSPITAL_COMMUNITY): Payer: Self-pay

## 2021-05-28 MED FILL — Estradiol TD Patch Twice Weekly 0.05 MG/24HR: TRANSDERMAL | 28 days supply | Qty: 8 | Fill #4 | Status: AC

## 2021-06-01 ENCOUNTER — Other Ambulatory Visit (HOSPITAL_COMMUNITY): Payer: Self-pay

## 2021-06-01 MED FILL — Progesterone Cap 100 MG: ORAL | 30 days supply | Qty: 30 | Fill #4 | Status: AC

## 2021-06-23 ENCOUNTER — Other Ambulatory Visit (HOSPITAL_COMMUNITY): Payer: Self-pay

## 2021-06-23 MED FILL — Estradiol TD Patch Twice Weekly 0.05 MG/24HR: TRANSDERMAL | 28 days supply | Qty: 8 | Fill #5 | Status: AC

## 2021-06-29 ENCOUNTER — Other Ambulatory Visit (HOSPITAL_COMMUNITY): Payer: Self-pay

## 2021-07-05 ENCOUNTER — Other Ambulatory Visit (HOSPITAL_COMMUNITY): Payer: Self-pay

## 2021-07-05 MED FILL — Progesterone Cap 100 MG: ORAL | 30 days supply | Qty: 30 | Fill #5 | Status: AC

## 2021-07-12 ENCOUNTER — Other Ambulatory Visit (HOSPITAL_COMMUNITY): Payer: Self-pay

## 2021-07-15 ENCOUNTER — Other Ambulatory Visit (HOSPITAL_COMMUNITY): Payer: Self-pay

## 2021-07-22 ENCOUNTER — Other Ambulatory Visit (HOSPITAL_COMMUNITY): Payer: Self-pay

## 2021-07-22 MED ORDER — PROGESTERONE MICRONIZED 100 MG PO CAPS
ORAL_CAPSULE | ORAL | 12 refills | Status: DC
  Filled 2021-07-22: qty 30, 30d supply, fill #0
  Filled 2021-09-06: qty 30, 30d supply, fill #1
  Filled 2021-10-04: qty 30, 30d supply, fill #2
  Filled 2021-11-01: qty 30, 30d supply, fill #3
  Filled 2021-12-02: qty 30, 30d supply, fill #4
  Filled 2021-12-31: qty 30, 30d supply, fill #5
  Filled 2022-02-01: qty 30, 30d supply, fill #6
  Filled 2022-03-03: qty 30, 30d supply, fill #7
  Filled 2022-04-04: qty 30, 30d supply, fill #8
  Filled 2022-05-06: qty 30, 30d supply, fill #9
  Filled 2022-06-13: qty 30, 30d supply, fill #10
  Filled 2022-07-11: qty 30, 30d supply, fill #11

## 2021-07-22 MED ORDER — ESTRADIOL 0.05 MG/24HR TD PTTW
MEDICATED_PATCH | TRANSDERMAL | 12 refills | Status: DC
  Filled 2021-07-22: qty 8, 28d supply, fill #0
  Filled 2021-08-23: qty 8, 28d supply, fill #1
  Filled 2021-09-15: qty 8, 28d supply, fill #2
  Filled 2021-10-04 (×2): qty 8, 28d supply, fill #3
  Filled 2021-11-08: qty 16, 56d supply, fill #4
  Filled 2021-12-31: qty 16, 56d supply, fill #5
  Filled 2022-03-03: qty 16, 56d supply, fill #6
  Filled 2022-04-29: qty 16, 56d supply, fill #7
  Filled 2022-06-27: qty 8, 28d supply, fill #8

## 2021-07-29 ENCOUNTER — Other Ambulatory Visit (HOSPITAL_COMMUNITY): Payer: Self-pay

## 2021-08-23 ENCOUNTER — Other Ambulatory Visit (HOSPITAL_COMMUNITY): Payer: Self-pay

## 2021-09-06 ENCOUNTER — Other Ambulatory Visit (HOSPITAL_COMMUNITY): Payer: Self-pay

## 2021-09-06 MED ORDER — SIMVASTATIN 40 MG PO TABS
40.0000 mg | ORAL_TABLET | Freq: Every evening | ORAL | 1 refills | Status: DC
  Filled 2021-09-06 – 2021-10-04 (×2): qty 90, 90d supply, fill #0
  Filled 2022-01-07: qty 90, 90d supply, fill #1

## 2021-09-06 MED ORDER — SERTRALINE HCL 100 MG PO TABS
100.0000 mg | ORAL_TABLET | Freq: Every day | ORAL | 1 refills | Status: DC
Start: 2021-09-06 — End: 2022-03-29
  Filled 2021-09-06: qty 90, 90d supply, fill #0
  Filled 2021-12-20: qty 90, 90d supply, fill #1

## 2021-09-06 MED ORDER — ERGOCALCIFEROL 1.25 MG (50000 UT) PO CAPS
ORAL_CAPSULE | ORAL | 1 refills | Status: DC
  Filled 2021-09-06: qty 13, 90d supply, fill #0

## 2021-09-06 MED ORDER — CLONAZEPAM 0.5 MG PO TABS
ORAL_TABLET | ORAL | 2 refills | Status: DC
  Filled 2021-09-06: qty 30, 30d supply, fill #0
  Filled 2021-12-20: qty 30, 30d supply, fill #1

## 2021-09-10 ENCOUNTER — Other Ambulatory Visit (HOSPITAL_COMMUNITY): Payer: Self-pay

## 2021-09-15 ENCOUNTER — Other Ambulatory Visit (HOSPITAL_COMMUNITY): Payer: Self-pay

## 2021-09-16 ENCOUNTER — Other Ambulatory Visit (HOSPITAL_COMMUNITY): Payer: Self-pay

## 2021-10-04 ENCOUNTER — Other Ambulatory Visit (HOSPITAL_COMMUNITY): Payer: Self-pay

## 2021-10-07 ENCOUNTER — Other Ambulatory Visit (HOSPITAL_COMMUNITY): Payer: Self-pay

## 2021-11-01 ENCOUNTER — Other Ambulatory Visit (HOSPITAL_COMMUNITY): Payer: Self-pay

## 2021-11-08 ENCOUNTER — Other Ambulatory Visit (HOSPITAL_COMMUNITY): Payer: Self-pay

## 2021-12-02 ENCOUNTER — Other Ambulatory Visit (HOSPITAL_COMMUNITY): Payer: Self-pay

## 2021-12-20 ENCOUNTER — Other Ambulatory Visit (HOSPITAL_COMMUNITY): Payer: Self-pay

## 2021-12-31 ENCOUNTER — Other Ambulatory Visit (HOSPITAL_COMMUNITY): Payer: Self-pay

## 2022-01-03 ENCOUNTER — Other Ambulatory Visit (HOSPITAL_COMMUNITY): Payer: Self-pay

## 2022-01-04 ENCOUNTER — Other Ambulatory Visit (HOSPITAL_COMMUNITY): Payer: Self-pay

## 2022-01-07 ENCOUNTER — Other Ambulatory Visit (HOSPITAL_COMMUNITY): Payer: Self-pay

## 2022-02-01 ENCOUNTER — Other Ambulatory Visit (HOSPITAL_COMMUNITY): Payer: Self-pay

## 2022-03-03 ENCOUNTER — Other Ambulatory Visit (HOSPITAL_COMMUNITY): Payer: Self-pay

## 2022-03-29 ENCOUNTER — Other Ambulatory Visit (HOSPITAL_COMMUNITY): Payer: Self-pay

## 2022-03-29 MED ORDER — SERTRALINE HCL 100 MG PO TABS
100.0000 mg | ORAL_TABLET | Freq: Every day | ORAL | 0 refills | Status: DC
  Filled 2022-03-29: qty 90, 90d supply, fill #0

## 2022-03-30 ENCOUNTER — Other Ambulatory Visit (HOSPITAL_COMMUNITY): Payer: Self-pay

## 2022-04-04 ENCOUNTER — Other Ambulatory Visit (HOSPITAL_COMMUNITY): Payer: Self-pay

## 2022-04-05 ENCOUNTER — Other Ambulatory Visit (HOSPITAL_COMMUNITY): Payer: Self-pay

## 2022-04-05 MED ORDER — SIMVASTATIN 40 MG PO TABS
ORAL_TABLET | ORAL | 0 refills | Status: DC
  Filled 2022-04-05: qty 90, 90d supply, fill #0

## 2022-04-20 ENCOUNTER — Other Ambulatory Visit (HOSPITAL_BASED_OUTPATIENT_CLINIC_OR_DEPARTMENT_OTHER): Payer: Self-pay

## 2022-04-20 MED ORDER — CLONAZEPAM 0.5 MG PO TABS
0.5000 mg | ORAL_TABLET | Freq: Every day | ORAL | 2 refills | Status: DC | PRN
  Filled 2022-04-20 – 2022-09-27 (×3): qty 30, 30d supply, fill #0

## 2022-04-20 MED ORDER — SIMVASTATIN 40 MG PO TABS
ORAL_TABLET | ORAL | 1 refills | Status: DC
  Filled 2022-06-29: qty 90, 90d supply, fill #0
  Filled 2022-10-03 – 2022-10-12 (×2): qty 90, 90d supply, fill #1

## 2022-04-20 MED ORDER — SERTRALINE HCL 100 MG PO TABS
ORAL_TABLET | ORAL | 1 refills | Status: DC
  Filled 2022-06-21 – 2022-06-23 (×2): qty 90, 90d supply, fill #0
  Filled 2022-09-27: qty 90, 90d supply, fill #1

## 2022-04-28 ENCOUNTER — Other Ambulatory Visit (HOSPITAL_BASED_OUTPATIENT_CLINIC_OR_DEPARTMENT_OTHER): Payer: Self-pay

## 2022-04-29 ENCOUNTER — Other Ambulatory Visit (HOSPITAL_COMMUNITY): Payer: Self-pay

## 2022-05-06 ENCOUNTER — Other Ambulatory Visit (HOSPITAL_COMMUNITY): Payer: Self-pay

## 2022-05-07 ENCOUNTER — Other Ambulatory Visit (HOSPITAL_COMMUNITY): Payer: Self-pay

## 2022-06-06 ENCOUNTER — Other Ambulatory Visit (HOSPITAL_COMMUNITY): Payer: Self-pay

## 2022-06-06 MED ORDER — LEVOTHYROXINE SODIUM 25 MCG PO TABS
ORAL_TABLET | ORAL | 1 refills | Status: DC
  Filled 2022-06-06: qty 30, 30d supply, fill #0

## 2022-06-13 ENCOUNTER — Other Ambulatory Visit (HOSPITAL_COMMUNITY): Payer: Self-pay

## 2022-06-15 ENCOUNTER — Other Ambulatory Visit (HOSPITAL_COMMUNITY): Payer: Self-pay

## 2022-06-21 ENCOUNTER — Other Ambulatory Visit (HOSPITAL_BASED_OUTPATIENT_CLINIC_OR_DEPARTMENT_OTHER): Payer: Self-pay

## 2022-06-23 ENCOUNTER — Other Ambulatory Visit (HOSPITAL_BASED_OUTPATIENT_CLINIC_OR_DEPARTMENT_OTHER): Payer: Self-pay

## 2022-06-23 ENCOUNTER — Other Ambulatory Visit (HOSPITAL_COMMUNITY): Payer: Self-pay

## 2022-06-27 ENCOUNTER — Other Ambulatory Visit (HOSPITAL_COMMUNITY): Payer: Self-pay

## 2022-06-29 ENCOUNTER — Other Ambulatory Visit (HOSPITAL_COMMUNITY): Payer: Self-pay

## 2022-07-11 ENCOUNTER — Other Ambulatory Visit (HOSPITAL_COMMUNITY): Payer: Self-pay

## 2022-07-12 ENCOUNTER — Other Ambulatory Visit (HOSPITAL_COMMUNITY): Payer: Self-pay

## 2022-07-25 ENCOUNTER — Other Ambulatory Visit (HOSPITAL_COMMUNITY): Payer: Self-pay

## 2022-07-26 ENCOUNTER — Other Ambulatory Visit (HOSPITAL_COMMUNITY): Payer: Self-pay

## 2022-07-26 MED ORDER — ESTRADIOL 0.05 MG/24HR TD PTTW
MEDICATED_PATCH | TRANSDERMAL | 1 refills | Status: DC
  Filled 2022-07-26: qty 8, 28d supply, fill #0
  Filled 2022-08-22: qty 8, 28d supply, fill #1

## 2022-08-05 ENCOUNTER — Other Ambulatory Visit (HOSPITAL_COMMUNITY): Payer: Self-pay

## 2022-08-05 MED ORDER — PROGESTERONE MICRONIZED 100 MG PO CAPS
100.0000 mg | ORAL_CAPSULE | Freq: Every day | ORAL | 0 refills | Status: DC
  Filled 2022-08-05: qty 30, 30d supply, fill #0

## 2022-08-06 ENCOUNTER — Other Ambulatory Visit (HOSPITAL_COMMUNITY): Payer: Self-pay

## 2022-08-22 ENCOUNTER — Other Ambulatory Visit (HOSPITAL_COMMUNITY): Payer: Self-pay

## 2022-09-01 ENCOUNTER — Other Ambulatory Visit (HOSPITAL_COMMUNITY): Payer: Self-pay

## 2022-09-01 MED ORDER — ESTRADIOL 0.025 MG/24HR TD PTTW
1.0000 | MEDICATED_PATCH | TRANSDERMAL | 3 refills | Status: DC
  Filled 2022-09-01: qty 24, 84d supply, fill #0

## 2022-09-01 MED ORDER — PROGESTERONE MICRONIZED 100 MG PO CAPS
100.0000 mg | ORAL_CAPSULE | Freq: Every day | ORAL | 3 refills | Status: DC
  Filled 2022-09-01: qty 90, 90d supply, fill #0

## 2022-09-02 ENCOUNTER — Other Ambulatory Visit (HOSPITAL_COMMUNITY): Payer: Self-pay

## 2022-09-13 ENCOUNTER — Other Ambulatory Visit (HOSPITAL_COMMUNITY): Payer: Self-pay

## 2022-09-20 ENCOUNTER — Telehealth: Payer: Self-pay | Admitting: *Deleted

## 2022-09-20 NOTE — Telephone Encounter (Signed)
Spoke with the patient regarding the referral to GYN oncology. Patient scheduled as new patient with Dr Ernestina Patches on 12/11 t 9:45 am. Patient given an arrival time of 9:15 am.  Explained to the patient the the doctor will perform a pelvic exam at this visit. Patient given the policy that no visitors under the 16 yrs are allowed in the [place]. Patient given the address/phone number for the clinic and that the center offers free valet service.

## 2022-09-22 ENCOUNTER — Encounter: Payer: Self-pay | Admitting: Psychiatry

## 2022-09-26 ENCOUNTER — Inpatient Hospital Stay (HOSPITAL_BASED_OUTPATIENT_CLINIC_OR_DEPARTMENT_OTHER): Payer: No Typology Code available for payment source | Admitting: Gynecologic Oncology

## 2022-09-26 ENCOUNTER — Other Ambulatory Visit (HOSPITAL_COMMUNITY): Payer: Self-pay

## 2022-09-26 ENCOUNTER — Encounter: Payer: Self-pay | Admitting: Psychiatry

## 2022-09-26 ENCOUNTER — Inpatient Hospital Stay: Payer: No Typology Code available for payment source | Attending: Psychiatry | Admitting: Psychiatry

## 2022-09-26 ENCOUNTER — Encounter: Payer: Self-pay | Admitting: Oncology

## 2022-09-26 VITALS — BP 113/73 | HR 76 | Temp 98.4°F | Resp 16 | Ht 67.0 in | Wt 223.0 lb

## 2022-09-26 DIAGNOSIS — N950 Postmenopausal bleeding: Secondary | ICD-10-CM | POA: Diagnosis not present

## 2022-09-26 DIAGNOSIS — M1990 Unspecified osteoarthritis, unspecified site: Secondary | ICD-10-CM | POA: Insufficient documentation

## 2022-09-26 DIAGNOSIS — C541 Malignant neoplasm of endometrium: Secondary | ICD-10-CM | POA: Insufficient documentation

## 2022-09-26 DIAGNOSIS — Z87891 Personal history of nicotine dependence: Secondary | ICD-10-CM | POA: Diagnosis not present

## 2022-09-26 DIAGNOSIS — E559 Vitamin D deficiency, unspecified: Secondary | ICD-10-CM | POA: Insufficient documentation

## 2022-09-26 DIAGNOSIS — Z801 Family history of malignant neoplasm of trachea, bronchus and lung: Secondary | ICD-10-CM | POA: Insufficient documentation

## 2022-09-26 DIAGNOSIS — Z6830 Body mass index (BMI) 30.0-30.9, adult: Secondary | ICD-10-CM | POA: Diagnosis not present

## 2022-09-26 DIAGNOSIS — F419 Anxiety disorder, unspecified: Secondary | ICD-10-CM | POA: Diagnosis not present

## 2022-09-26 DIAGNOSIS — E669 Obesity, unspecified: Secondary | ICD-10-CM | POA: Insufficient documentation

## 2022-09-26 DIAGNOSIS — F32A Depression, unspecified: Secondary | ICD-10-CM | POA: Diagnosis not present

## 2022-09-26 DIAGNOSIS — E785 Hyperlipidemia, unspecified: Secondary | ICD-10-CM | POA: Insufficient documentation

## 2022-09-26 DIAGNOSIS — Z79890 Hormone replacement therapy (postmenopausal): Secondary | ICD-10-CM | POA: Insufficient documentation

## 2022-09-26 DIAGNOSIS — K219 Gastro-esophageal reflux disease without esophagitis: Secondary | ICD-10-CM | POA: Diagnosis not present

## 2022-09-26 DIAGNOSIS — Z8619 Personal history of other infectious and parasitic diseases: Secondary | ICD-10-CM | POA: Insufficient documentation

## 2022-09-26 DIAGNOSIS — Z79899 Other long term (current) drug therapy: Secondary | ICD-10-CM | POA: Diagnosis not present

## 2022-09-26 DIAGNOSIS — K5900 Constipation, unspecified: Secondary | ICD-10-CM | POA: Insufficient documentation

## 2022-09-26 MED ORDER — TRAMADOL HCL 50 MG PO TABS
50.0000 mg | ORAL_TABLET | Freq: Four times a day (QID) | ORAL | 0 refills | Status: DC | PRN
  Filled 2022-09-26: qty 10, 3d supply, fill #0

## 2022-09-26 MED ORDER — SENNOSIDES-DOCUSATE SODIUM 8.6-50 MG PO TABS
2.0000 | ORAL_TABLET | Freq: Every day | ORAL | 0 refills | Status: DC
  Filled 2022-09-26: qty 30, 15d supply, fill #0

## 2022-09-26 NOTE — Progress Notes (Unsigned)
GYNECOLOGIC ONCOLOGY NEW PATIENT CONSULTATION  Date of Service: 09/26/2022 Referring Provider: Myer Peer, MD   ASSESSMENT AND PLAN: Whitney Davenport is a 56 y.o. woman with ***.  ***  A copy of this note was sent to the patient's referring provider.  Clide Cliff, MD Gynecologic Oncology   Medical Decision Making I personally spent  TOTAL *** minutes face-to-face and non-face-to-face in the care of this patient, which includes all pre, intra, and post visit time on the date of service.  *** minutes spent reviewing records prior to the visit *** Minutes in patient contact      *** minutes in other billable services *** minutes charting , conferring with consultants etc.   ------------  CC: {GynOnc [phone]}  HISTORY OF PRESENT ILLNESS:  Whitney Davenport is a 56 y.o. woman who is seen in consultation at the request of Tally Joe, MD for evaluation of ***.  Patient presented to her OB/GYN for postmenopausal bleeding on her replacement therapy with estrogen patch and p.o. Prometrium. A pelvic ultrasound on 09/07/2022 demonstrated endometrial stripe of 11.6 mm.  She then returned for an endometrial biopsy which returned with FIGO grade 1 endometrial cancer.  Sporadic periods up until 10 months ago - spoting, never went a year without bleeding   The patient denies abdominal bloating, early satiety, significant weight loss, change in bowel or bladder habits.  Presents alone  TREATMENT HISTORY: Oncology History   No history exists.    PAST MEDICAL HISTORY: Past Medical History:  Diagnosis Date   Allergy    Anemia    hx of   Anxiety    Back injury    "ruptured disc-no problems since surgery"   Back pain    Constipation    Depression    GERD (gastroesophageal reflux disease)    occ   Hepatitis 2004   hx of hep c-"interferon treatment, does not show up on blood test anymore"   History of kidney stones    Hyperlipidemia    Hypotension    no  fainting in years   Infertility, female    Lumbar disc disease    Osteoarthritis    Vitamin D deficiency     PAST SURGICAL HISTORY: Past Surgical History:  Procedure Laterality Date   CERVICAL CONE BIOPSY  1987   DIAGNOSTIC LAPAROSCOPY  last 2001   for endometriosis, couple of surgeries   GANGLION CYST EXCISION Left age 69   left hand   LEEP  1998   LUMBAR LAMINECTOMY/DECOMPRESSION MICRODISCECTOMY  11/03/2011   Procedure: LUMBAR LAMINECTOMY/DECOMPRESSION MICRODISCECTOMY;  Surgeon: Hewitt Shorts, MD;  Location: [facility];  Service: Neurosurgery;  Laterality: Left;  LEFT Lumbar five sacral one laminotomy and microdiskectomy   TOTAL HIP ARTHROPLASTY Right 02/03/2015   Procedure: RIGHT TOTAL HIP ARTHROPLASTY ANTERIOR APPROACH;  Surgeon: Durene Romans, MD;  Location: [facility];  Service: Orthopedics;  Laterality: Right;    OB/GYN HISTORY: OB History  Gravida Para Term Preterm AB Living  0 0 0 0 0 0  SAB IAB Ectopic Multiple Live Births  0 0 0 0 0      Age at menarche: 39 Age at menopause: 56 Hx of HRT: Yes, estrogen patch and p.o. Prometrium Hx of STI: No Last pap: 07/10/2020, within normal limits, HPV negative History of abnormal pap smears: Conization of the cervix 02/19/1986, LEEP 1998  SCREENING STUDIES:  Last mammogram: 2023 Last colonoscopy: 2019 - f/u in 10 years  MEDICATIONS:  Current Outpatient Medications:  cetirizine (ZYRTEC) 10 MG tablet, Take 10 mg by mouth at bedtime. , Disp: , Rfl:    clonazePAM (KLONOPIN) 0.5 MG tablet, Take 0.5-1 tablets (0.25-0.5 mg total) by mouth daily as needed., Disp: 30 tablet, Rfl: 2   progesterone (PROMETRIUM) 100 MG capsule, Take 1 capsule (100 mg total) by mouth at bedtime., Disp: 90 capsule, Rfl: 3   senna-docusate (SENOKOT-S) 8.6-50 MG tablet, Take 2 tablets by mouth at bedtime. For AFTER surgery, do not take if having diarrhea, Disp: 30 tablet, Rfl: 0   sertraline (ZOLOFT) 100 MG tablet, Take 1 tablet by mouth once a day,  Disp: 90 tablet, Rfl: 1   simvastatin (ZOCOR) 40 MG tablet, Take 1 tablet by mouth once a day, Disp: 90 tablet, Rfl: 1   traMADol (ULTRAM) 50 MG tablet, Take 1 tablet (50 mg total) by mouth every 6 (six) hours as needed for severe pain. For AFTER surgery only, do not take and drive, Disp: 10 tablet, Rfl: 0  ALLERGIES: Allergies  Allergen Reactions   Levothyroxine Itching   Sulfa Antibiotics Nausea And Vomiting and Other (See Comments)    Causes headache with nausea   Hydrocodone Itching and Rash   Zithromax [Azithromycin] Itching and Rash    FAMILY HISTORY: Family History  Problem Relation Age of Onset   Thyroid disease Mother    Diabetes Father    Hyperlipidemia Father    Obesity Father    Lung cancer Maternal Grandmother    Colon cancer Neg Hx    Breast cancer Neg Hx    Ovarian cancer Neg Hx    Endometrial cancer Neg Hx    Prostate cancer Neg Hx    Pancreatic cancer Neg Hx     SOCIAL HISTORY: Social History   Socioeconomic History   Marital status: Married    Spouse name: Heriberto Antigua   Number of children: 1   Years of education: Not on file   Highest education level: Not on file  Occupational History   Occupation: Patient Theatre manager  Tobacco Use   Smoking status: Former    Years: 15.00    Types: Cigarettes    Quit date: 10/31/2010    Years since quitting: 11.9   Smokeless tobacco: Never  Vaping Use   Vaping Use: Never used  Substance and Sexual Activity   Alcohol use: Yes    Comment: rare   Drug use: No   Sexual activity: Not Currently    Partners: Male  Other Topics Concern   Not on file  Social History Narrative   Not on file   Social Determinants of Health   Financial Resource Strain: Not on file  Food Insecurity: Not on file  Transportation Needs: Not on file  Physical Activity: Not on file  Stress: Not on file  Social Connections: Not on file  Intimate Partner Violence: Not on file    REVIEW OF SYSTEMS: New patient intake form was  reviewed.  Complete 10-system review is negative except for the following: Vaginal bleeding  PHYSICAL EXAM: BP 113/73 (BP Location: Right Arm, Patient Position: Sitting)   Pulse 76   Temp 98.4 F (36.9 C) (Oral)   Resp 16   Ht 5\' 7"  (1.702 m)   Wt 223 lb (101.2 kg)   SpO2 99%   BMI 34.93 kg/m  Constitutional: No acute distress. Neuro/Psych: ***Alert, oriented.  Head and Neck: ***Normocephalic, atraumatic. Neck symmetric without masses. Sclera anicteric.  Respiratory: ***Normal work of breathing. ***Clear to auscultation bilaterally. Cardiovascular: ***Regular rate  and rhythm, no murmurs, rubs, or gallops. Abdomen: ***Normoactive bowel sounds. ***Soft, non-distended, non-tender to palpation. ***No masses or hepatosplenomegaly appreciated. ***No evidence of hernia. ***No palpable fluid wave. ***incisions. Extremities: ***Grossly normal range of motion. Warm, well perfused. No edema bilaterally. Skin: ***No rashes or lesions. Lymphatic: ***No cervical, supraclavicular, or inguinal adenopathy. Genitourinary: ***External genitalia without lesions. Urethral meatus ***without lesions or prolapse. On speculum exam, ***vagina and cervix without lesions. Bimanual exam reveals ***. ***Rectovaginal exam confirms the above findings and reveals normal sphincter tone and ***no masses or nodularity. Exam chaperoned by ***  LABORATORY AND RADIOLOGIC DATA: ***Outside medical records were reviewed to synthesize the above history, along with the history and physical obtained during the visit.  Outside ***laboratory, pathology, and imaging reports were reviewed, with pertinent results below.  ***I personally reviewed the outside images.  WBC  Date Value Ref Range Status  10/21/2020 8.6 4.0 - 10.5 K/uL Final   Hemoglobin  Date Value Ref Range Status  10/21/2020 12.5 12.0 - 15.0 g/dL Final  09/81/1914 78.2 11.1 - 15.9 g/dL Final   HCT  Date Value Ref Range Status  10/21/2020 40.9 36.0 - 46.0 % Final    Hematocrit  Date Value Ref Range Status  11/22/2017 38.3 34.0 - 46.6 % Final   Platelets  Date Value Ref Range Status  10/21/2020 175 150 - 400 K/uL Final   Magnesium  Date Value Ref Range Status  01/02/2017 2.3 1.6 - 2.3 mg/dL Final   Creatinine, Ser  Date Value Ref Range Status  10/21/2020 1.02 (H) 0.44 - 1.00 mg/dL Final   AST  Date Value Ref Range Status  07/09/2018 12 0 - 40 IU/L Final   ALT  Date Value Ref Range Status  07/09/2018 8 0 - 32 IU/L Final    Surgical pathology (09/13/22): Endometrial biopsy: Well-differentiated endometrial adenocarcinoma, FIGO 1  Pelvic ultrasound (09/07/22): Uterus measuring 8.3 x 5.1 x 4.3 cm. Endometrial stripe 11.6 mm No adnexal abnormalities

## 2022-09-26 NOTE — Patient Instructions (Addendum)
Preparing for your Surgery  Plan for surgery on October 18, 2022 with Dr. Bernadene Bell at [facility] will be scheduled for robotic assisted total laparoscopic hysterectomy (removal of the uterus and cervix), bilateral salpingo-oophorectomy (removal of both ovaries and fallopian tubes), sentinel lymph node biopsy, possible lymph node dissection, possible laparotomy (larger incision on your abdomen if needed).   Pre-operative Testing -You will receive a phone call from presurgical testing at [facility] to arrange for a pre-operative appointment and lab work.  -Bring your insurance card, copy of an advanced directive if applicable, medication list  -At that visit, you will be asked to sign a consent for a possible blood transfusion in case a transfusion becomes necessary during surgery.  The need for a blood transfusion is rare but having consent is a necessary part of your care.     -You should not be taking blood thinners or aspirin at least ten days prior to surgery unless instructed by your surgeon.  -Do not take supplements such as fish oil (omega 3), red yeast rice, turmeric before your surgery. You want to avoid medications with aspirin in them including headache powders such as BC or Goody's), Excedrin migraine.  Day Before Surgery at [place] will be asked to take in a light diet the day before surgery. You will be advised you can have clear liquids up until 3 hours before your surgery.    Eat a light diet the day before surgery.  Examples including soups, broths, toast, yogurt, mashed potatoes.  AVOID GAS PRODUCING FOODS. Things to avoid include carbonated beverages (fizzy beverages, sodas), raw fruits and raw vegetables (uncooked), or beans.   If your bowels are filled with gas, your surgeon will have difficulty visualizing your pelvic organs which increases your surgical risks.  Your role in recovery Your role is to become active as soon as directed by your  doctor, while still giving yourself time to heal.  Rest when you feel tired. You will be asked to do the following in order to speed your recovery:  - Cough and breathe deeply. This helps to clear and expand your lungs and can prevent pneumonia after surgery.  - Ronald. Do mild physical activity. Walking or moving your legs help your circulation and body functions return to normal. Do not try to get up or walk alone the first time after surgery.   -If you develop swelling on one leg or the other, pain in the back of your leg, redness/warmth in one of your legs, please call the office or go to the Emergency Room to have a doppler to rule out a blood clot. For shortness of breath, chest pain-seek care in the Emergency Room as soon as possible. - Actively manage your pain. Managing your pain lets you move in comfort. We will ask you to rate your pain on a scale of zero to 10. It is your responsibility to tell your doctor or nurse where and how much you hurt so your pain can be treated.  Special Considerations -If you are diabetic, you may be placed on insulin after surgery to have closer control over your blood sugars to promote healing and recovery.  This does not mean that you will be discharged on insulin.  If applicable, your oral antidiabetics will be resumed when you are tolerating a solid diet.  -Your final pathology results from surgery should be available around one week after surgery and the results will  be relayed to you when available.  -FMLA forms can be faxed to [phone] and please allow 5-7 business days for completion.  Pain Management After Surgery -You have been prescribed your pain medication and bowel regimen medications before surgery so that you can have these available when you are discharged from the hospital. The pain medication is for use ONLY AFTER surgery and a new prescription will not be given.   -Make sure that you have Tylenol and Ibuprofen  (USE SPARINGLY AND TAKE WITH FOOD SINCE IBUPROFEN USE WITH ZOLOFT CAN INCREASE YOUR RISK OF GASTROINTESTINAL BLEEDING) IF YOU ARE ABLE TO TAKE THESE MEDICATIONS at home to use on a regular basis after surgery for pain control. We recommend alternating the medications every hour to six hours since they work differently and are processed in the body differently for pain relief.  -Review the attached handout on narcotic use and their risks and side effects.   Bowel Regimen -You have been prescribed Sennakot-S to take nightly to prevent constipation especially if you are taking the narcotic pain medication intermittently.  It is important to prevent constipation and drink adequate amounts of liquids. You can stop taking this medication when you are not taking pain medication and you are back on your normal bowel routine.  Risks of Surgery Risks of surgery are low but include bleeding, infection, damage to surrounding structures, re-operation, blood clots, and very rarely death.   Blood Transfusion Information (For the consent to be signed before surgery)  We will be checking your blood type before surgery so in case of emergencies, we will know what type of blood you would need.                                            WHAT IS A BLOOD TRANSFUSION?  A transfusion is the replacement of blood or some of its parts. Blood is made up of multiple cells which provide different functions. Red blood cells carry oxygen and are used for blood loss replacement. White blood cells fight against infection. Platelets control bleeding. Plasma helps clot blood. Other blood products are available for specialized needs, such as hemophilia or other clotting disorders. BEFORE THE TRANSFUSION  Who gives blood for transfusions?  You may be able to donate blood to be used at a later date on yourself (autologous donation). Relatives can be asked to donate blood. This is generally not any safer than if you have received  blood from a stranger. The same precautions are taken to ensure safety when a relative's blood is donated. Healthy volunteers who are fully evaluated to make sure their blood is safe. This is blood bank blood. Transfusion therapy is the safest it has ever been in the practice of medicine. Before blood is taken from a donor, a complete history is taken to make sure that person has no history of diseases nor engages in risky social behavior (examples are intravenous drug use or sexual activity with multiple partners). The donor's travel history is screened to minimize risk of transmitting infections, such as malaria. The donated blood is tested for signs of infectious diseases, such as HIV and hepatitis. The blood is then tested to be sure it is compatible with you in order to minimize the chance of a transfusion reaction. If you or a relative donates blood, this is often done in anticipation of surgery and is not  appropriate for emergency situations. It takes many days to process the donated blood. RISKS AND COMPLICATIONS Although transfusion therapy is very safe and saves many lives, the main dangers of transfusion include:  Getting an infectious disease. Developing a transfusion reaction. This is an allergic reaction to something in the blood you were given. Every precaution is taken to prevent this. The decision to have a blood transfusion has been considered carefully by your caregiver before blood is given. Blood is not given unless the benefits outweigh the risks.  AFTER SURGERY INSTRUCTIONS  Return to work: 4-6 weeks if applicable  Activity: 1. Be up and out of the bed during the day.  Take a nap if needed.  You may walk up steps but be careful and use the hand rail.  Stair climbing will tire you more than you think, you may need to stop part way and rest.   2. No lifting or straining for 6 weeks over 10 pounds. No pushing, pulling, straining for 6 weeks.  3. No driving for around 1 week(s).   Do not drive if you are taking narcotic pain medicine and make sure that your reaction time has returned.   4. You can shower as soon as the next day after surgery. Shower daily.  Use your regular soap and water (not directly on the incision) and pat your incision(s) dry afterwards; don't rub.  No tub baths or submerging your body in water until cleared by your surgeon. If you have the soap that was given to you by pre-surgical testing that was used before surgery, you do not need to use it afterwards because this can irritate your incisions.   5. No sexual activity and nothing in the vagina for 8-10 weeks.  6. You may experience a small amount of clear drainage from your incisions, which is normal.  If the drainage persists, increases, or changes color please call the office.  7. Do not use creams, lotions, or ointments such as neosporin on your incisions after surgery until advised by your surgeon because they can cause removal of the dermabond glue on your incisions.    8. You may experience vaginal spotting after surgery or around the 6-8 week mark from surgery when the stitches at the top of the vagina begin to dissolve.  The spotting is normal but if you experience heavy bleeding, call our office.  9. Take Tylenol or ibuprofen first for pain if you are able to take these medications and only use narcotic pain medication for severe pain not relieved by the Tylenol or Ibuprofen.  Monitor your Tylenol intake to a max of 4,000 mg in a 24 hour period. You can alternate these medications after surgery.  Diet: 1. Low sodium Heart Healthy Diet is recommended but you are cleared to resume your normal (before surgery) diet after your procedure.  2. It is safe to use a laxative, such as Miralax or Colace, if you have difficulty moving your bowels. You have been prescribed Sennakot-S to take at bedtime every evening after surgery to keep bowel movements regular and to prevent constipation.    Wound  Care: 1. Keep clean and dry.  Shower daily.  Reasons to call the Doctor: Fever - Oral temperature greater than 100.4 degrees Fahrenheit Foul-smelling vaginal discharge Difficulty urinating Nausea and vomiting Increased pain at the site of the incision that is unrelieved with pain medicine. Difficulty breathing with or without chest pain New calf pain especially if only on one side Sudden, continuing  increased vaginal bleeding with or without clots.   Contacts: For questions or concerns you should contact:  Dr. Bernadene Bell at [place], NP at [phone]  After Hours: call [phone] and have the GYN Oncologist paged/contacted (after 5 pm or on the weekends).  Messages sent via mychart are for non-urgent matters and are not responded to after hours so for urgent needs, please call the after hours number.

## 2022-09-26 NOTE — Progress Notes (Signed)
Met with Whitney Davenport during her new patient appointment with Dr. Ernestina Patches.  Provided her with my contact information and encouraged her to call with any questions or needs.

## 2022-09-26 NOTE — H&P (View-Only) (Signed)
GYNECOLOGIC ONCOLOGY NEW PATIENT CONSULTATION  Date of Service: 09/26/2022 Referring Provider: Azucena Fallen, MD   ASSESSMENT AND PLAN: Whitney Davenport is a 56 y.o. woman with FIGO grade 1 endometrioid endometrial cancer.  We reviewed the nature of endometrial cancer and its recommended surgical staging, including total hysterectomy, bilateral salpingo-oophorectomy, and lymph node assessment. The patient is a suitable candidate for staging via a minimally invasive approach to surgery.  We reviewed that robotic assistance would be used to complete the surgery. We discussed that most endometrial cancer is detected early and that decisions regarding adjuvant therapy will be made based on her final pathology.   We reviewed the sentinel lymph node technique. Risks and benefits of sentinel lymph node biopsy was reviewed. We reviewed the technique and ICG dye. The patient DOES NOT have an iodine allergy or known liver dysfunction. We reviewed the false negative rate (0.4%), and that 3% of patients with metastatic disease will not have it detected by SLN biopsy in endometrial cancer. A low risk of allergic reaction to the dye, <0.2% for ICG, has been reported. We also discussed that in the case of failed mapping, which occurs 40% of the time, a bilateral or unilateral lymphadenectomy will be performed at the surgeon's discretion.   Potential benefits of sentinel nodes including a higher detection rate for metastasis due to ultrastaging and potential reduction in operative morbidity. However, there remains uncertainty as to the role for treatment of micrometastatic disease. Further, the benefit of operative morbidity associated with the SLN technique in endometrial cancer is not yet completely known. In other patient populations (e.g. the cervical cancer population) there has been observed reductions in morbidity with SLN biopsy compared to pelvic lymphadenectomy. Lymphedema, nerve dysfunction and lymphocysts  are all potential risks with the SLN technique as with complete lymphadenectomy. Additional risks to the patient include the risk of damage to an internal organ while operating in an altered view (e.g. the black and white image of the robotic fluorescence imaging mode).   Patient was consented for: Robotic assisted total laparoscopic hysterectomy, bilateral salpingo-oophorectomy, sentinel lymph node evaluation and biopsy, possible lymph node dissection on 10/18/22.  The risks of surgery were discussed in detail and she understands these to including but not limited to bleeding requiring a blood transfusion, infection, injury to adjacent organs (including but not limited to the bowels, bladder, ureters, nerves, blood vessels), thromboembolic events, wound separation, hernia, vaginal cuff separation, possible risk of lymphedema and lymphocyst if lymphadenectomy performed, unforseen complication, and possible need for re-exploration.  If the patient experiences any of these events, she understands that her hospitalization or recovery may be prolonged and that she may need to take additional medications for a prolonged period. The patient will receive DVT and antibiotic prophylaxis as indicated. She voiced a clear understanding. She had the opportunity to ask questions and written informed consent was obtained today. She wishes to proceed.  She does not require preoperative clearance. Her METs are >4.  All preoperative instructions were reviewed. Postoperative expectations were also reviewed.   A copy of this note was sent to the patient's referring provider.  Bernadene Bell, MD Gynecologic Oncology   Medical Decision Making I personally spent  TOTAL 53 minutes face-to-face and non-face-to-face in the care of this patient, which includes all pre, intra, and post visit time on the date of service.  3 minutes spent reviewing records prior to the visit 45 Minutes in patient contact 5 minutes charting ,  conferring with consultants etc.   ------------  CC: Endometrial cancer  HISTORY OF PRESENT ILLNESS:  Whitney Davenport is a 56 y.o. woman who is seen in consultation at the request of Azucena Fallen, MD for evaluation of new diagnosis of endometrial cancer.  Patient presented to her OB/GYN for postmenopausal bleeding on her replacement therapy with estrogen patch and p.o. Prometrium. A pelvic ultrasound on 09/07/2022 demonstrated endometrial stripe of 11.6 mm.  She then returned for an endometrial biopsy which returned with FIGO grade 1 endometrial cancer.  Today, patient reports that she has had sporadic light bleeding up until about 10 minutes ago.  She then went 10 months without bleeding, but she reports that she has never gone a full year without bleeding.  She otherwise denies abdominal bloating, early satiety, significant weight loss, change in bowel or bladder habits.   TREATMENT HISTORY: Oncology History  Endometrial cancer ([place])  09/07/2022 Imaging   Pelvic ultrasound: Uterus measuring 8.3 x 5.1 x 4.3 cm. Endometrial stripe 11.6 mm No adnexal abnormalities   09/13/2022 Pathology Results   Endometrial biopsy: Well-differentiated endometrial adenocarcinoma, FIGO 1   09/13/2022 Initial Diagnosis   Endometrial cancer ([place])     PAST MEDICAL HISTORY: Past Medical History:  Diagnosis Date   Allergy    Anemia    hx of   Anxiety    Back injury    "ruptured disc-no problems since surgery"   Back pain    Constipation    Depression    GERD (gastroesophageal reflux disease)    occ   Hepatitis 2004   hx of hep c-"interferon treatment, does not show up on blood test anymore"   History of kidney stones    Hyperlipidemia    Hypotension    no fainting in years   Infertility, female    Lumbar disc disease    Osteoarthritis    Vitamin D deficiency     PAST SURGICAL HISTORY: Past Surgical History:  Procedure Laterality Date   CERVICAL CONE BIOPSY  1987    DIAGNOSTIC LAPAROSCOPY  last 2001   for endometriosis, couple of surgeries   GANGLION CYST EXCISION Left age 6   left hand   LEEP  1998   LUMBAR LAMINECTOMY/DECOMPRESSION MICRODISCECTOMY  11/03/2011   Procedure: LUMBAR LAMINECTOMY/DECOMPRESSION MICRODISCECTOMY;  Surgeon: Hosie Spangle, MD;  Location: [facility];  Service: Neurosurgery;  Laterality: Left;  LEFT Lumbar five sacral one laminotomy and microdiskectomy   TOTAL HIP ARTHROPLASTY Right 02/03/2015   Procedure: RIGHT TOTAL HIP ARTHROPLASTY ANTERIOR APPROACH;  Surgeon: Paralee Cancel, MD;  Location: [facility] OR[facility];  Service: Orthopedics;  Laterality: Right;    OB/GYN HISTORY: OB History  Gravida Para Term Preterm AB Living  0 0 0 0 0 0  SAB IAB Ectopic Multiple Live Births  0 0 0 0 0      Age at menarche: 4 Age at menopause: 18 Hx of HRT: Yes, estrogen patch and p.o. Prometrium Hx of STI: No Last pap: 07/10/2020, within normal limits, HPV negative History of abnormal pap smears: Conization of the cervix 02/19/1986, LEEP 1998  SCREENING STUDIES:  Last mammogram: 2023 Last colonoscopy: 2019 - f/u in 10 years  MEDICATIONS:  Current Outpatient Medications:    cetirizine (ZYRTEC) 10 MG tablet, Take 10 mg by mouth at bedtime. , Disp: , Rfl:    clonazePAM (KLONOPIN) 0.5 MG tablet, Take 0.5-1 tablets (0.25-0.5 mg total) by mouth daily as needed., Disp: 30 tablet, Rfl: 2   progesterone (PROMETRIUM) 100 MG capsule, Take 1 capsule (100 mg total) by mouth  at bedtime., Disp: 90 capsule, Rfl: 3   senna-docusate (SENOKOT-S) 8.6-50 MG tablet, Take 2 tablets by mouth at bedtime. For AFTER surgery, do not take if having diarrhea, Disp: 30 tablet, Rfl: 0   sertraline (ZOLOFT) 100 MG tablet, Take 1 tablet by mouth once a day, Disp: 90 tablet, Rfl: 1   simvastatin (ZOCOR) 40 MG tablet, Take 1 tablet by mouth once a day, Disp: 90 tablet, Rfl: 1   traMADol (ULTRAM) 50 MG tablet, Take 1 tablet (50 mg total) by mouth every 6 (six) hours as needed for  severe pain. For AFTER surgery only, do not take and drive, Disp: 10 tablet, Rfl: 0  ALLERGIES: Allergies  Allergen Reactions   Levothyroxine Itching   Sulfa Antibiotics Nausea And Vomiting and Other (See Comments)    Causes headache with nausea   Hydrocodone Itching and Rash   Zithromax [Azithromycin] Itching and Rash    FAMILY HISTORY: Family History  Problem Relation Age of Onset   Thyroid disease Mother    Diabetes Father    Hyperlipidemia Father    Obesity Father    Lung cancer Maternal Grandmother    Colon cancer Neg Hx    Breast cancer Neg Hx    Ovarian cancer Neg Hx    Endometrial cancer Neg Hx    Prostate cancer Neg Hx    Pancreatic cancer Neg Hx     SOCIAL HISTORY: Social History   Socioeconomic History   Marital status: Married    Spouse name: Estill Dooms   Number of children: 1   Years of education: Not on file   Highest education level: Not on file  Occupational History   Occupation: Patient Theatre manager  Tobacco Use   Smoking status: Former    Years: 15.00    Types: Cigarettes    Quit date: 10/31/2010    Years since quitting: 11.9   Smokeless tobacco: Never  Vaping Use   Vaping Use: Never used  Substance and Sexual Activity   Alcohol use: Yes    Comment: rare   Drug use: No   Sexual activity: Not Currently    Partners: Male  Other Topics Concern   Not on file  Social History Narrative   Not on file   Social Determinants of Health   Financial Resource Strain: Not on file  Food Insecurity: Not on file  Transportation Needs: Not on file  Physical Activity: Not on file  Stress: Not on file  Social Connections: Not on file  Intimate Partner Violence: Not on file    REVIEW OF SYSTEMS: New patient intake form was reviewed.  Complete 10-system review is negative except for the following: Vaginal bleeding  PHYSICAL EXAM: BP 113/73 (BP Location: Right Arm, Patient Position: Sitting)   Pulse 76   Temp 98.4 F (36.9 C) (Oral)   Resp 16    Ht '5\' 7"'$  (1.702 m)   Wt 223 lb (101.2 kg)   SpO2 99%   BMI 34.93 kg/m  Constitutional: No acute distress. Neuro/Psych: Alert, oriented.  Head and Neck: Normocephalic, atraumatic. Neck symmetric without masses. Sclera anicteric.  Respiratory: Normal work of breathing. Clear to auscultation bilaterally. Cardiovascular: Regular rate and rhythm, no murmurs, rubs, or gallops. Abdomen: Normoactive bowel sounds. Soft, non-distended, non-tender to palpation. No masses or hepatosplenomegaly appreciated.No evidence of hernia. No palpable fluid wave.  Extremities: Grossly normal range of motion. Warm, well perfused. No edema bilaterally. Skin: No rashes or lesions. Lymphatic: No cervical, supraclavicular, or inguinal adenopathy. Genitourinary: External  genitalia without lesions. Urethral meatus without lesions or prolapse. On speculum exam, vagina and cervix without lesions. Bimanual exam reveals normal cervix and small mobile uterus.  No adnexal masses. Rectovaginal exam confirms the above findings and reveals normal sphincter tone and no masses or nodularity. Exam chaperoned by Joylene John, NP  LABORATORY AND RADIOLOGIC DATA: Outside medical records were reviewed to synthesize the above history, along with the history and physical obtained during the visit.  Outside laboratory, pathology, and imaging reports were reviewed, with pertinent results below.   WBC  Date Value Ref Range Status  10/21/2020 8.6 4.0 - 10.5 K/uL Final   Hemoglobin  Date Value Ref Range Status  10/21/2020 12.5 12.0 - 15.0 g/dL Final  11/22/2017 11.9 11.1 - 15.9 g/dL Final   HCT  Date Value Ref Range Status  10/21/2020 40.9 36.0 - 46.0 % Final   Hematocrit  Date Value Ref Range Status  11/22/2017 38.3 34.0 - 46.6 % Final   Platelets  Date Value Ref Range Status  10/21/2020 175 150 - 400 K/uL Final   Magnesium  Date Value Ref Range Status  01/02/2017 2.3 1.6 - 2.3 mg/dL Final   Creatinine, Ser  Date Value  Ref Range Status  10/21/2020 1.02 (H) 0.44 - 1.00 mg/dL Final   AST  Date Value Ref Range Status  07/09/2018 12 0 - 40 IU/L Final   ALT  Date Value Ref Range Status  07/09/2018 8 0 - 32 IU/L Final    Surgical pathology (09/13/22): Endometrial biopsy: Well-differentiated endometrial adenocarcinoma, FIGO 1  Pelvic ultrasound (09/07/22): Uterus measuring 8.3 x 5.1 x 4.3 cm. Endometrial stripe 11.6 mm No adnexal abnormalities

## 2022-09-27 ENCOUNTER — Other Ambulatory Visit (HOSPITAL_BASED_OUTPATIENT_CLINIC_OR_DEPARTMENT_OTHER): Payer: Self-pay

## 2022-09-27 ENCOUNTER — Other Ambulatory Visit: Payer: Self-pay | Admitting: Gynecologic Oncology

## 2022-09-27 ENCOUNTER — Encounter: Payer: Self-pay | Admitting: Psychiatry

## 2022-09-27 DIAGNOSIS — C541 Malignant neoplasm of endometrium: Secondary | ICD-10-CM | POA: Insufficient documentation

## 2022-09-29 NOTE — Patient Instructions (Signed)
Preparing for your Surgery   Plan for surgery on October 18, 2022 with Dr. Bernadene Bell at [facility] will be scheduled for robotic assisted total laparoscopic hysterectomy (removal of the uterus and cervix), bilateral salpingo-oophorectomy (removal of both ovaries and fallopian tubes), sentinel lymph node biopsy, possible lymph node dissection, possible laparotomy (larger incision on your abdomen if needed).    Pre-operative Testing -You will receive a phone call from presurgical testing at [facility] to arrange for a pre-operative appointment and lab work.   -Bring your insurance card, copy of an advanced directive if applicable, medication list   -At that visit, you will be asked to sign a consent for a possible blood transfusion in case a transfusion becomes necessary during surgery.  The need for a blood transfusion is rare but having consent is a necessary part of your care.      -You should not be taking blood thinners or aspirin at least ten days prior to surgery unless instructed by your surgeon.   -Do not take supplements such as fish oil (omega 3), red yeast rice, turmeric before your surgery. You want to avoid medications with aspirin in them including headache powders such as BC or Goody's), Excedrin migraine.   Day Before Surgery at [place] will be asked to take in a light diet the day before surgery. You will be advised you can have clear liquids up until 3 hours before your surgery.     Eat a light diet the day before surgery.  Examples including soups, broths, toast, yogurt, mashed potatoes.  AVOID GAS PRODUCING FOODS. Things to avoid include carbonated beverages (fizzy beverages, sodas), raw fruits and raw vegetables (uncooked), or beans.    If your bowels are filled with gas, your surgeon will have difficulty visualizing your pelvic organs which increases your surgical risks.   Your role in recovery Your role is to become active as soon as  directed by your doctor, while still giving yourself time to heal.  Rest when you feel tired. You will be asked to do the following in order to speed your recovery:   - Cough and breathe deeply. This helps to clear and expand your lungs and can prevent pneumonia after surgery.  - [place]. Do mild physical activity. Walking or moving your legs help your circulation and body functions return to normal. Do not try to get up or walk alone the first time after surgery.   -If you develop swelling on one leg or the other, pain in the back of your leg, redness/warmth in one of your legs, please call the office or go to the Emergency Room to have a doppler to rule out a blood clot. For shortness of breath, chest pain-seek care in the Emergency Room as soon as possible. - Actively manage your pain. Managing your pain lets you move in comfort. We will ask you to rate your pain on a scale of zero to 10. It is your responsibility to tell your doctor or nurse where and how much you hurt so your pain can be treated.   Special Considerations -If you are diabetic, you may be placed on insulin after surgery to have closer control over your blood sugars to promote healing and recovery.  This does not mean that you will be discharged on insulin.  If applicable, your oral antidiabetics will be resumed when you are tolerating a solid diet.   -Your final pathology results from  surgery should be available around one week after surgery and the results will be relayed to you when available.   -FMLA forms can be faxed to [phone] and please allow 5-7 business days for completion.   Pain Management After Surgery -You have been prescribed your pain medication and bowel regimen medications before surgery so that you can have these available when you are discharged from the hospital. The pain medication is for use ONLY AFTER surgery and a new prescription will not be given.    -Make sure that you have  Tylenol and Ibuprofen (USE SPARINGLY AND TAKE WITH FOOD SINCE IBUPROFEN USE WITH ZOLOFT CAN INCREASE YOUR RISK OF GASTROINTESTINAL BLEEDING) IF YOU ARE ABLE TO TAKE THESE MEDICATIONS at home to use on a regular basis after surgery for pain control. We recommend alternating the medications every hour to six hours since they work differently and are processed in the body differently for pain relief.   -Review the attached handout on narcotic use and their risks and side effects.    Bowel Regimen -You have been prescribed Sennakot-S to take nightly to prevent constipation especially if you are taking the narcotic pain medication intermittently.  It is important to prevent constipation and drink adequate amounts of liquids. You can stop taking this medication when you are not taking pain medication and you are back on your normal bowel routine.   Risks of Surgery Risks of surgery are low but include bleeding, infection, damage to surrounding structures, re-operation, blood clots, and very rarely death.     Blood Transfusion Information (For the consent to be signed before surgery)   We will be checking your blood type before surgery so in case of emergencies, we will know what type of blood you would need.                                             WHAT IS A BLOOD TRANSFUSION?   A transfusion is the replacement of blood or some of its parts. Blood is made up of multiple cells which provide different functions. Red blood cells carry oxygen and are used for blood loss replacement. White blood cells fight against infection. Platelets control bleeding. Plasma helps clot blood. Other blood products are available for specialized needs, such as hemophilia or other clotting disorders. BEFORE THE TRANSFUSION  Who gives blood for transfusions?  You may be able to donate blood to be used at a later date on yourself (autologous donation). Relatives can be asked to donate blood. This is generally not any  safer than if you have received blood from a stranger. The same precautions are taken to ensure safety when a relative's blood is donated. Healthy volunteers who are fully evaluated to make sure their blood is safe. This is blood bank blood. Transfusion therapy is the safest it has ever been in the practice of medicine. Before blood is taken from a donor, a complete history is taken to make sure that person has no history of diseases nor engages in risky social behavior (examples are intravenous drug use or sexual activity with multiple partners). The donor's travel history is screened to minimize risk of transmitting infections, such as malaria. The donated blood is tested for signs of infectious diseases, such as HIV and hepatitis. The blood is then tested to be sure it is compatible with you in order to minimize  the chance of a transfusion reaction. If you or a relative donates blood, this is often done in anticipation of surgery and is not appropriate for emergency situations. It takes many days to process the donated blood. RISKS AND COMPLICATIONS Although transfusion therapy is very safe and saves many lives, the main dangers of transfusion include:  Getting an infectious disease. Developing a transfusion reaction. This is an allergic reaction to something in the blood you were given. Every precaution is taken to prevent this. The decision to have a blood transfusion has been considered carefully by your caregiver before blood is given. Blood is not given unless the benefits outweigh the risks.   AFTER SURGERY INSTRUCTIONS   Return to work: 4-6 weeks if applicable   Activity: 1. Be up and out of the bed during the day.  Take a nap if needed.  You may walk up steps but be careful and use the hand rail.  Stair climbing will tire you more than you think, you may need to stop part way and rest.    2. No lifting or straining for 6 weeks over 10 pounds. No pushing, pulling, straining for 6 weeks.    3. No driving for around 1 week(s).  Do not drive if you are taking narcotic pain medicine and make sure that your reaction time has returned.    4. You can shower as soon as the next day after surgery. Shower daily.  Use your regular soap and water (not directly on the incision) and pat your incision(s) dry afterwards; don't rub.  No tub baths or submerging your body in water until cleared by your surgeon. If you have the soap that was given to you by pre-surgical testing that was used before surgery, you do not need to use it afterwards because this can irritate your incisions.    5. No sexual activity and nothing in the vagina for 8-10 weeks.   6. You may experience a small amount of clear drainage from your incisions, which is normal.  If the drainage persists, increases, or changes color please call the office.   7. Do not use creams, lotions, or ointments such as neosporin on your incisions after surgery until advised by your surgeon because they can cause removal of the dermabond glue on your incisions.     8. You may experience vaginal spotting after surgery or around the 6-8 week mark from surgery when the stitches at the top of the vagina begin to dissolve.  The spotting is normal but if you experience heavy bleeding, call our office.   9. Take Tylenol or ibuprofen first for pain if you are able to take these medications and only use narcotic pain medication for severe pain not relieved by the Tylenol or Ibuprofen.  Monitor your Tylenol intake to a max of 4,000 mg in a 24 hour period. You can alternate these medications after surgery.   Diet: 1. Low sodium Heart Healthy Diet is recommended but you are cleared to resume your normal (before surgery) diet after your procedure.   2. It is safe to use a laxative, such as Miralax or Colace, if you have difficulty moving your bowels. You have been prescribed Sennakot-S to take at bedtime every evening after surgery to keep bowel movements  regular and to prevent constipation.     Wound Care: 1. Keep clean and dry.  Shower daily.   Reasons to call the Doctor: Fever - Oral temperature greater than 100.4 degrees Fahrenheit Foul-smelling  vaginal discharge Difficulty urinating Nausea and vomiting Increased pain at the site of the incision that is unrelieved with pain medicine. Difficulty breathing with or without chest pain New calf pain especially if only on one side Sudden, continuing increased vaginal bleeding with or without clots.   Contacts: For questions or concerns you should contact:   Dr. Bernadene Bell at [place], NP at [phone]   After Hours: call [phone] and have the GYN Oncologist paged/contacted (after 5 pm or on the weekends).   Messages sent via mychart are for non-urgent matters and are not responded to after hours so for urgent needs, please call the after hours number.

## 2022-09-29 NOTE — Progress Notes (Signed)
Patient here for new patient consultation with Dr. Bernadene Bell and for a pre-operative appointment prior to her scheduled surgery on October 18, 2022. She is scheduled for robotic assisted total laparoscopic hysterectomy, bilateral salpingo-oophorectomy, sentinel lymph node biopsy, possible lymph node dissection, possible laparotomy. The surgery was discussed in detail.  See after visit summary for additional details.    Discussed post-op pain management in detail including the aspects of the enhanced recovery pathway.  Advised her that a new prescription would be sent in for tramadol and it is only to be used for after her upcoming surgery.  We discussed the use of tylenol post-op and to monitor for a maximum of 4,000 mg in a 24 hour period.  Also prescribed sennakot to be used after surgery and to hold if having loose stools.  Discussed bowel regimen in detail.     Discussed measures to take at home to prevent DVT including frequent mobility.  Reportable signs and symptoms of DVT discussed. Post-operative instructions discussed and expectations for after surgery. Incisional care discussed as well including reportable signs and symptoms including erythema, drainage, wound separation.     10 minutes spent with the patient and preparing information.  Verbalizing understanding of material discussed. No needs or concerns voiced at the end of the visit.   Advised patient to call for any needs.  Advised that her post-operative medications had been prescribed and could be picked up at any time.    This appointment is included in the global surgical bundle as pre-operative teaching and has no charge.

## 2022-10-03 ENCOUNTER — Other Ambulatory Visit (HOSPITAL_BASED_OUTPATIENT_CLINIC_OR_DEPARTMENT_OTHER): Payer: Self-pay

## 2022-10-05 ENCOUNTER — Inpatient Hospital Stay: Payer: No Typology Code available for payment source | Admitting: Licensed Clinical Social Worker

## 2022-10-05 NOTE — Progress Notes (Signed)
[place] Work  Initial Assessment   Whitney Davenport is a 56 y.o. year old female contacted by phone. Clinical Social Work was referred by new patient protocol for assessment of psychosocial needs.   SDOH (Social Determinants of Health) assessments performed: Yes SDOH Interventions    Flowsheet Row Clinical Support from 10/05/2022 in [facility]  SDOH Interventions   Food Insecurity Interventions Intervention Not Indicated  Housing Interventions Intervention Not Indicated  Transportation Interventions Intervention Not Indicated       SDOH Screenings   Food Insecurity: No Food Insecurity (10/05/2022)  Housing: Low Risk  (10/05/2022)  Transportation Needs: No Transportation Needs (10/05/2022)  Tobacco Use: Medium Risk (09/27/2022)     Distress Screen completed: No     No data to display            Family/Social Information:  Housing Arrangement: patient lives with husband Family members/support persons in your life? Family and Friends Transportation concerns: no  Employment: Working full time for [employer]. Works virtually.  Income source: Employment and husband's employment (with [employer]) Financial concerns: No Type of concern: None Food access concerns: no Religious or spiritual practice: Yes-has prayer warriors supporting her Services Currently in place:    Coping/ Adjustment to diagnosis: Patient understands treatment plan and what happens next? yes, has surgery in the new year. Is trying to stay positive Patient reported stressors: Adjusting to my illness Hopes and/or priorities: hopes that surgery addresses everything and she does not need further treatment Current coping skills/ strengths: Capable of independent living , Communication skills , Motivation for treatment/growth , Religious Affiliation , and Supportive family/friends     SUMMARY: Current SDOH Barriers:  None noted today  Clinical Social Work  Clinical Goal(s):  No clinical social work goals at this time  Interventions: Discussed common feeling and emotions when being diagnosed with cancer, and the importance of support during treatment Informed patient of the support team roles and support services at [facility] Provided [place] contact information and encouraged patient to call with any questions or concerns   Follow Up Plan:  CSW will follow-up with patient by phone a few days after surgery to check on emotional coping Patient verbalizes understanding of plan: Yes    Kratos Ruscitti E Sony Schlarb, LCSW

## 2022-10-06 NOTE — Patient Instructions (Addendum)
SURGICAL WAITING ROOM VISITATION Patients having surgery or a procedure may have no more than 2 support people in the waiting area - these visitors may rotate in the visitor waiting room.   Due to an increase in RSV and influenza rates and associated hospitalizations, children ages 71 and under may not visit patients in [place]. If the patient needs to stay at the hospital during part of their recovery, the visitor guidelines for inpatient rooms apply.  PRE-OP VISITATION  Pre-op nurse will coordinate an appropriate time for 1 support person to accompany the patient in pre-op.  This support person may not rotate.  This visitor will be contacted when the time is appropriate for the visitor to come back in the pre-op area.  Please refer to the [facility] website for the visitor guidelines for Inpatients (after your surgery is over and you are in a regular room).  You are not required to quarantine at this time prior to your surgery. However, you must do this: Hand Hygiene often Do NOT share personal items Notify your provider if you are in close contact with someone who has COVID or you develop fever 100.4 or greater, new onset of sneezing, cough, sore throat, shortness of breath or body aches.  If you test positive for Covid or have been in contact with anyone that has tested positive in the last 10 days please notify you surgeon.    Your procedure is scheduled on:  Tuesday  October 18, 2022  Report to [facility] Main Entrance: [address] entrance where the [employer] is available.   Report to admitting at: 05:15 AM  +++++Call this number if you have any questions or problems the morning of surgery [phone]  Do not eat food after Midnight the night prior to your surgery/procedure.  After Midnight you may have the following liquids until  04:30 AM DAY OF SURGERY  Clear Liquid Diet Water Black Coffee (sugar ok, NO MILK/CREAM OR CREAMERS)  Tea (sugar ok, NO  MILK/CREAM OR CREAMERS) regular and decaf                             Plain Jell-O  with no fruit (NO RED)                                           Fruit ices (not with fruit pulp, NO RED)                                     Popsicles (NO RED)                                                                  Juice: apple, WHITE grape, WHITE cranberry Sports drinks like Gatorade or Powerade (NO RED)             FOLLOW BOWEL PREP AND ANY ADDITIONAL PRE OP INSTRUCTIONS YOU RECEIVED FROM YOUR SURGEON'S OFFICE!!!  Eat a light diet the day before surgery.  Examples including soups, broths, toast, yogurt, mashed potatoes.  AVOID GAS PRODUCING FOODS. Things to avoid include carbonated beverages (fizzy beverages, sodas), raw fruits and raw vegetables (uncooked), or beans.     Oral Hygiene is also important to reduce your risk of infection.        Remember - BRUSH YOUR TEETH THE MORNING OF SURGERY WITH YOUR REGULAR TOOTHPASTE  Take ONLY these medicines the morning of surgery with A SIP OF WATER: Sertraline (Zoloft) and you may take Clonazepam (Klonopin) if needed for anxiety.                 You may not have any metal on your body including hair pins, jewelry, and body piercing  Do not wear make-up, lotions, powders, perfumes or deodorant  Do not wear nail polish including gel and S&S, artificial / acrylic nails, or any other type of covering on natural nails including finger and toenails. If you have artificial nails, gel coating, etc., that needs to be removed by a nail salon, Please have this removed prior to surgery. Not doing so may mean that your surgery could be cancelled or delayed if the Surgeon or anesthesia staff feels like they are unable to monitor you safely.   Do not shave 48 hours prior to surgery to avoid nicks in your skin which may contribute to postoperative infections.   Patients discharged on the day of surgery will not be allowed to drive home.  Someone NEEDS to stay with you  for the first 24 hours after anesthesia.  Do not bring your home medications to the hospital. The Pharmacy will dispense medications listed on your medication list to you during your admission in the Hospital.  Please read over the following fact sheets you were given: IF YOU HAVE QUESTIONS ABOUT YOUR PRE-OP INSTRUCTIONS, PLEASE CALL [phone]  ([place])   [facility] - Preparing for Surgery Before surgery, you can play an important role.  Because skin is not sterile, your skin needs to be as free of germs as possible.  You can reduce the number of germs on your skin by washing with CHG (chlorahexidine gluconate) soap before surgery.  CHG is an antiseptic cleaner which kills germs and bonds with the skin to continue killing germs even after washing. Please DO NOT use if you have an allergy to CHG or antibacterial soaps.  If your skin becomes reddened/irritated stop using the CHG and inform your nurse when you arrive at Short Stay. Do not shave (including legs and underarms) for at least 48 hours prior to the first CHG shower.  You may shave your face/neck.  Please follow these instructions carefully:  1.  Shower with CHG Soap the night before surgery and the  morning of surgery.  2.  If you choose to wash your hair, wash your hair first as usual with your normal  shampoo.  3.  After you shampoo, rinse your hair and body thoroughly to remove the shampoo.                             4.  Use CHG as you would any other liquid soap.  You can apply chg directly to the skin and wash.  Gently with a scrungie or clean washcloth.  5.  Apply the CHG Soap to your body ONLY FROM THE NECK DOWN.   Do not use on face/ open  Wound or open sores. Avoid contact with eyes, ears mouth and genitals (private parts).                       Wash face,  Genitals (private parts) with your normal soap.             6.  Wash thoroughly, paying special attention to the area where your  surgery  will be  performed.  7.  Thoroughly rinse your body with warm water from the neck down.  8.  DO NOT shower/wash with your normal soap after using and rinsing off the CHG Soap.            9.  Pat yourself dry with a clean towel.            10.  Wear clean pajamas.            11.  Place clean sheets on your bed the night of your first shower and do not  sleep with pets.  ON THE DAY OF SURGERY : Do not apply any lotions/deodorants the morning of surgery.  Please wear clean clothes to the hospital/surgery center.    FAILURE TO FOLLOW THESE INSTRUCTIONS MAY RESULT IN THE CANCELLATION OF YOUR SURGERY  PATIENT SIGNATURE_________________________________  NURSE SIGNATURE__________________________________  ________________________________________________________________________

## 2022-10-06 NOTE — Progress Notes (Addendum)
COVID Vaccine received:  _0  No _1  Yes Date of any COVID positive Test in last 90 days: None  PCP - Antony Contras, MD Cardiologist - None  Chest x-ray - 09-14-2017 EKG -10-21-2020  (panic attacks caused [place], Negative EKG)  No reason to repeat at PST Stress Test -  ECHO -  Cardiac Cath -   PCR screen: _2  Ordered & Completed                      _3   No Order but Needs PROFEND                      _4   N/A for this surgery  Surgery Plan:  _5  Ambulatory                            _6  Outpatient in bed                            _7  Admit  Anesthesia:    _8  General  _9  Spinal                           _10   Choice _11   MAC  Bowel Prep - _12  No  _13   Yes __GYN Preop diet__  Pacemaker / ICD device _14  No _15  Yes        Device order form faxed _16  No    _17   Yes      Faxed to:  Spinal Cord Stimulator:_18  No _19  Yes      (Remind patient to bring remote DOS) Other Implants:   History of Sleep Apnea? _20  No _21  Yes   CPAP used?- _22  No _23  Yes    Does the patient monitor blood sugar? _24  No _25  Yes  _26  N/A  Blood Thinner / Instructions: none Aspirin Instructions:none  ERAS Protocol Ordered: _27  No  _28  Yes PRE-SURGERY _29  ENSURE  _30  G2  _31  No Drink Ordered Patient is to be NPO after: 04:30 am  Activity level: Patient can climb a flight of stairs without difficulty; _32  No CP  _33  No SOB.  Patient can perform ADLs without assistance.   Anesthesia review: anemia, anxiety, GERD, hx Hep C (treated 2004- not detectable now)  Patient denies shortness of breath, fever, cough and chest pain at PAT appointment.  Patient verbalized understanding and agreement to the Pre-Surgical Instructions that were given to them at this PAT appointment. Patient was also educated of the need to review these PAT instructions again prior to his/her surgery.I reviewed the appropriate phone numbers to call if they have any and questions or concerns.

## 2022-10-07 ENCOUNTER — Other Ambulatory Visit: Payer: Self-pay

## 2022-10-07 ENCOUNTER — Other Ambulatory Visit (HOSPITAL_COMMUNITY): Payer: Self-pay

## 2022-10-07 ENCOUNTER — Encounter (HOSPITAL_COMMUNITY): Payer: Self-pay

## 2022-10-07 ENCOUNTER — Telehealth: Payer: Self-pay

## 2022-10-07 ENCOUNTER — Encounter (HOSPITAL_COMMUNITY)
Admission: RE | Admit: 2022-10-07 | Discharge: 2022-10-07 | Disposition: A | Discharge status: Home / Self Care | Payer: No Typology Code available for payment source | Source: Ambulatory Visit | Attending: Psychiatry | Admitting: Psychiatry

## 2022-10-07 DIAGNOSIS — C541 Malignant neoplasm of endometrium: Secondary | ICD-10-CM | POA: Insufficient documentation

## 2022-10-07 DIAGNOSIS — Z01812 Encounter for preprocedural laboratory examination: Secondary | ICD-10-CM | POA: Diagnosis not present

## 2022-10-07 HISTORY — DX: Malignant (primary) neoplasm, unspecified: C80.1

## 2022-10-07 HISTORY — DX: Hypothyroidism, unspecified: E03.9

## 2022-10-07 LAB — COMPREHENSIVE METABOLIC PANEL
ALT: 16 U/L (ref 0–44)
AST: 19 U/L (ref 15–41)
Albumin: 4.1 g/dL (ref 3.5–5.0)
Alkaline Phosphatase: 60 U/L (ref 38–126)
Anion gap: 7 (ref 5–15)
BUN: 15 mg/dL (ref 6–20)
CO2: 25 mmol/L (ref 22–32)
Calcium: 8.9 mg/dL (ref 8.9–10.3)
Chloride: 106 mmol/L (ref 98–111)
Creatinine, Ser: 1.14 mg/dL — ABNORMAL HIGH (ref 0.44–1.00)
GFR, Estimated: 56 mL/min — ABNORMAL LOW (ref >60)
Glucose, Bld: 125 mg/dL — ABNORMAL HIGH (ref 70–99)
Potassium: 4.8 mmol/L (ref 3.5–5.1)
Sodium: 138 mmol/L (ref 135–145)
Total Bilirubin: 0.5 mg/dL (ref 0.3–1.2)
Total Protein: 8.1 g/dL (ref 6.5–8.1)

## 2022-10-07 LAB — TYPE AND SCREEN
ABO/RH(D): O POS
Antibody Screen: NEGATIVE

## 2022-10-07 LAB — CBC
HCT: 40.5 % (ref 36.0–46.0)
Hemoglobin: 12.3 g/dL (ref 12.0–15.0)
MCH: 27.6 pg (ref 26.0–34.0)
MCHC: 30.4 g/dL (ref 30.0–36.0)
MCV: 91 fL (ref 80.0–100.0)
Platelets: 206 10*3/uL (ref 150–400)
RBC: 4.45 MIL/uL (ref 3.87–5.11)
RDW: 14.2 % (ref 11.5–15.5)
WBC: 6.5 10*3/uL (ref 4.0–10.5)
nRBC: 0 % (ref 0.0–0.2)

## 2022-10-07 NOTE — Telephone Encounter (Signed)
Faxed CBC and CMP to PCP at [facility].

## 2022-10-11 ENCOUNTER — Other Ambulatory Visit (HOSPITAL_BASED_OUTPATIENT_CLINIC_OR_DEPARTMENT_OTHER): Payer: Self-pay

## 2022-10-12 ENCOUNTER — Other Ambulatory Visit (HOSPITAL_BASED_OUTPATIENT_CLINIC_OR_DEPARTMENT_OTHER): Payer: Self-pay

## 2022-10-16 NOTE — Anesthesia Preprocedure Evaluation (Signed)
Anesthesia Evaluation  Patient identified by MRN, date of birth, ID band Patient awake    Reviewed: Allergy & Precautions, NPO status , Patient's Chart, lab work & pertinent test results  History of Anesthesia Complications Negative for: history of anesthetic complications  Airway Mallampati: II  TM Distance: >3 FB Neck ROM: Full   Comment: Previous grade I view with MAC 3, easy mask ventilation Dental  (+) Teeth Intact, Dental Advisory Given   Pulmonary neg shortness of breath, neg sleep apnea, neg COPD, neg recent URI, Patient abstained from smoking., former smoker   Pulmonary exam normal breath sounds clear to auscultation       Cardiovascular (-) hypertension(-) angina (-) Past MI, (-) Cardiac Stents and (-) CABG (-) dysrhythmias  Rhythm:Regular Rate:Normal  HLD   Neuro/Psych neg Seizures PSYCHIATRIC DISORDERS Anxiety Depression     Neuromuscular disease (lumbar disc disease)    GI/Hepatic ,GERD  ,,(+) Hepatitis - (2004, s/p interferon treatment), C  Endo/Other  neg diabetesHypothyroidism  Pre-diabetes  Renal/GU negative Renal ROS     Musculoskeletal  (+) Arthritis , Osteoarthritis,    Abdominal  (+) + obese  Peds  Hematology  (+) Blood dyscrasia, anemia   Anesthesia Other Findings Endometrial cancer  Reproductive/Obstetrics                             Anesthesia Physical Anesthesia Plan  ASA: 3  Anesthesia Plan: General   Post-op Pain Management:    Induction: Intravenous  PONV Risk Score and Plan: 3 and Ondansetron, Dexamethasone, Scopolamine patch - Pre-op and Treatment may vary due to age or medical condition  Airway Management Planned: Oral ETT  Additional Equipment:   Intra-op Plan:   Post-operative Plan: Extubation in OR  Informed Consent: I have reviewed the patients History and Physical, chart, labs and discussed the procedure including the risks, benefits and  alternatives for the proposed anesthesia with the patient or authorized representative who has indicated his/her understanding and acceptance.     Dental advisory given  Plan Discussed with: CRNA and Anesthesiologist  Anesthesia Plan Comments: (Risks of general anesthesia discussed including, but not limited to, sore throat, hoarse voice, chipped/damaged teeth, injury to vocal cords, nausea and vomiting, allergic reactions, lung infection, heart attack, stroke, and death. All questions answered. )        Anesthesia Quick Evaluation

## 2022-10-18 ENCOUNTER — Other Ambulatory Visit: Payer: Self-pay

## 2022-10-18 ENCOUNTER — Ambulatory Visit (HOSPITAL_COMMUNITY): Payer: 59 | Admitting: Anesthesiology

## 2022-10-18 ENCOUNTER — Ambulatory Visit (HOSPITAL_COMMUNITY)
Admission: RE | Admit: 2022-10-18 | Discharge: 2022-10-18 | Disposition: A | Discharge status: Home / Self Care | Payer: 59 | Source: Ambulatory Visit | Attending: Psychiatry | Admitting: Psychiatry

## 2022-10-18 ENCOUNTER — Encounter (HOSPITAL_COMMUNITY)
Admission: RE | Disposition: A | Discharge status: Home / Self Care | Payer: Self-pay | Source: Ambulatory Visit | Attending: Psychiatry

## 2022-10-18 ENCOUNTER — Encounter (HOSPITAL_COMMUNITY): Payer: Self-pay | Admitting: Psychiatry

## 2022-10-18 ENCOUNTER — Ambulatory Visit (HOSPITAL_BASED_OUTPATIENT_CLINIC_OR_DEPARTMENT_OTHER): Payer: 59 | Admitting: Anesthesiology

## 2022-10-18 ENCOUNTER — Telehealth: Payer: Self-pay

## 2022-10-18 DIAGNOSIS — N8003 Adenomyosis of the uterus: Secondary | ICD-10-CM | POA: Diagnosis not present

## 2022-10-18 DIAGNOSIS — C541 Malignant neoplasm of endometrium: Secondary | ICD-10-CM | POA: Diagnosis not present

## 2022-10-18 DIAGNOSIS — N736 Female pelvic peritoneal adhesions (postinfective): Secondary | ICD-10-CM | POA: Diagnosis not present

## 2022-10-18 DIAGNOSIS — K660 Peritoneal adhesions (postprocedural) (postinfection): Secondary | ICD-10-CM

## 2022-10-18 DIAGNOSIS — E039 Hypothyroidism, unspecified: Secondary | ICD-10-CM | POA: Diagnosis not present

## 2022-10-18 DIAGNOSIS — F418 Other specified anxiety disorders: Secondary | ICD-10-CM | POA: Insufficient documentation

## 2022-10-18 DIAGNOSIS — R7989 Other specified abnormal findings of blood chemistry: Secondary | ICD-10-CM

## 2022-10-18 DIAGNOSIS — D630 Anemia in neoplastic disease: Secondary | ICD-10-CM | POA: Diagnosis not present

## 2022-10-18 DIAGNOSIS — Z87891 Personal history of nicotine dependence: Secondary | ICD-10-CM | POA: Insufficient documentation

## 2022-10-18 DIAGNOSIS — E785 Hyperlipidemia, unspecified: Secondary | ICD-10-CM | POA: Diagnosis not present

## 2022-10-18 HISTORY — PX: SENTINEL NODE BIOPSY: SHX6608

## 2022-10-18 HISTORY — PX: ROBOTIC ASSISTED TOTAL HYSTERECTOMY WITH BILATERAL SALPINGO OOPHERECTOMY: SHX6086

## 2022-10-18 SURGERY — HYSTERECTOMY, TOTAL, ROBOT-ASSISTED, LAPAROSCOPIC, WITH BILATERAL SALPINGO-OOPHORECTOMY
Anesthesia: General

## 2022-10-18 MED ORDER — ONDANSETRON HCL 4 MG/2ML IJ SOLN
INTRAMUSCULAR | Status: DC | PRN
  Administered 2022-10-18: 4 mg via INTRAVENOUS

## 2022-10-18 MED ORDER — FENTANYL CITRATE (PF) 250 MCG/5ML IJ SOLN
INTRAMUSCULAR | Status: AC
  Filled 2022-10-18: qty 5

## 2022-10-18 MED ORDER — STERILE WATER FOR IRRIGATION IR SOLN
Status: DC | PRN
  Administered 2022-10-18: 1000 mL

## 2022-10-18 MED ORDER — GABAPENTIN 300 MG PO CAPS
300.0000 mg | ORAL_CAPSULE | ORAL | Status: AC
  Administered 2022-10-18: 300 mg via ORAL
  Filled 2022-10-18: qty 1

## 2022-10-18 MED ORDER — OXYCODONE HCL 5 MG PO TABS: 5.0000 mg | ORAL_TABLET | Freq: Once | ORAL | Status: AC | PRN

## 2022-10-18 MED ORDER — DEXAMETHASONE SODIUM PHOSPHATE 10 MG/ML IJ SOLN
INTRAMUSCULAR | Status: DC | PRN
  Administered 2022-10-18: 10 mg via INTRAVENOUS

## 2022-10-18 MED ORDER — OXYCODONE HCL 5 MG/5ML PO SOLN
5.0000 mg | Freq: Once | ORAL | Status: AC | PRN
  Administered 2022-10-18: 5 mg via ORAL

## 2022-10-18 MED ORDER — PROPOFOL 10 MG/ML IV BOLUS
INTRAVENOUS | Status: AC
  Filled 2022-10-18: qty 20

## 2022-10-18 MED ORDER — SCOPOLAMINE 1 MG/3DAYS TD PT72
1.0000 | MEDICATED_PATCH | TRANSDERMAL | Status: DC
  Administered 2022-10-18: 1.5 mg via TRANSDERMAL
  Filled 2022-10-18: qty 1

## 2022-10-18 MED ORDER — ROCURONIUM BROMIDE 10 MG/ML (PF) SYRINGE
PREFILLED_SYRINGE | INTRAVENOUS | Status: DC | PRN
  Administered 2022-10-18: 50 mg via INTRAVENOUS
  Administered 2022-10-18: 10 mg via INTRAVENOUS
  Administered 2022-10-18: 20 mg via INTRAVENOUS

## 2022-10-18 MED ORDER — CEFAZOLIN SODIUM-DEXTROSE 2-4 GM/100ML-% IV SOLN
2.0000 g | INTRAVENOUS | Status: AC
  Administered 2022-10-18: 2 g via INTRAVENOUS
  Filled 2022-10-18: qty 100

## 2022-10-18 MED ORDER — LIDOCAINE HCL (PF) 2 % IJ SOLN
INTRAMUSCULAR | Status: AC
  Filled 2022-10-18: qty 20

## 2022-10-18 MED ORDER — BUPIVACAINE HCL 0.25 % IJ SOLN
INTRAMUSCULAR | Status: AC
  Filled 2022-10-18: qty 1

## 2022-10-18 MED ORDER — DEXAMETHASONE SODIUM PHOSPHATE 10 MG/ML IJ SOLN
INTRAMUSCULAR | Status: AC
  Filled 2022-10-18: qty 1

## 2022-10-18 MED ORDER — PROMETHAZINE HCL 25 MG/ML IJ SOLN: 6.2500 mg | INTRAMUSCULAR | Status: DC | PRN

## 2022-10-18 MED ORDER — MIDAZOLAM HCL 2 MG/2ML IJ SOLN
INTRAMUSCULAR | Status: DC | PRN
  Administered 2022-10-18: 2 mg via INTRAVENOUS

## 2022-10-18 MED ORDER — CHLORHEXIDINE GLUCONATE 0.12 % MT SOLN
15.0000 mL | Freq: Once | OROMUCOSAL | Status: AC
  Administered 2022-10-18: 15 mL via OROMUCOSAL

## 2022-10-18 MED ORDER — LACTATED RINGERS IR SOLN
Status: DC | PRN
  Administered 2022-10-18: 1000 mL

## 2022-10-18 MED ORDER — MIDAZOLAM HCL 2 MG/2ML IJ SOLN
INTRAMUSCULAR | Status: AC
  Filled 2022-10-18: qty 2

## 2022-10-18 MED ORDER — KETOROLAC TROMETHAMINE 15 MG/ML IJ SOLN: 15.0000 mg | INTRAMUSCULAR | Status: DC

## 2022-10-18 MED ORDER — STERILE WATER FOR INJECTION IJ SOLN
INTRAMUSCULAR | Status: DC | PRN
  Administered 2022-10-18: 1 mL via INTRAMUSCULAR

## 2022-10-18 MED ORDER — LACTATED RINGERS IV SOLN: INTRAVENOUS | Status: DC | PRN

## 2022-10-18 MED ORDER — HEPARIN SODIUM (PORCINE) 5000 UNIT/ML IJ SOLN
5000.0000 [IU] | INTRAMUSCULAR | Status: AC
  Administered 2022-10-18: 5000 [IU] via SUBCUTANEOUS
  Filled 2022-10-18: qty 1

## 2022-10-18 MED ORDER — SUGAMMADEX SODIUM 500 MG/5ML IV SOLN
INTRAVENOUS | Status: DC | PRN
  Administered 2022-10-18: 250 mg via INTRAVENOUS

## 2022-10-18 MED ORDER — BUPIVACAINE HCL 0.25 % IJ SOLN
INTRAMUSCULAR | Status: DC | PRN
  Administered 2022-10-18: 23 mL

## 2022-10-18 MED ORDER — ONDANSETRON HCL 4 MG/2ML IJ SOLN
INTRAMUSCULAR | Status: AC
  Filled 2022-10-18: qty 2

## 2022-10-18 MED ORDER — ORAL CARE MOUTH RINSE: 15.0000 mL | Freq: Once | OROMUCOSAL | Status: AC

## 2022-10-18 MED ORDER — SUGAMMADEX SODIUM 500 MG/5ML IV SOLN
INTRAVENOUS | Status: AC
  Filled 2022-10-18: qty 5

## 2022-10-18 MED ORDER — ACETAMINOPHEN 500 MG PO TABS
1000.0000 mg | ORAL_TABLET | ORAL | Status: AC
  Administered 2022-10-18: 1000 mg via ORAL
  Filled 2022-10-18: qty 2

## 2022-10-18 MED ORDER — LIDOCAINE 2% (20 MG/ML) 5 ML SYRINGE
INTRAMUSCULAR | Status: DC | PRN
  Administered 2022-10-18: 100 mg via INTRAVENOUS
  Administered 2022-10-18: 1.5 mg/kg/h via INTRAVENOUS

## 2022-10-18 MED ORDER — STERILE WATER FOR INJECTION IJ SOLN
INTRAMUSCULAR | Status: DC | PRN
  Administered 2022-10-18: 10 mL

## 2022-10-18 MED ORDER — STERILE WATER FOR INJECTION IJ SOLN
INTRAMUSCULAR | Status: AC
  Filled 2022-10-18: qty 10

## 2022-10-18 MED ORDER — FENTANYL CITRATE PF 50 MCG/ML IJ SOSY: 25.0000 ug | PREFILLED_SYRINGE | INTRAMUSCULAR | Status: DC | PRN

## 2022-10-18 MED ORDER — LACTATED RINGERS IV SOLN: INTRAVENOUS | Status: DC

## 2022-10-18 MED ORDER — FENTANYL CITRATE (PF) 250 MCG/5ML IJ SOLN
INTRAMUSCULAR | Status: DC | PRN
  Administered 2022-10-18 (×2): 50 ug via INTRAVENOUS
  Administered 2022-10-18: 100 ug via INTRAVENOUS

## 2022-10-18 MED ORDER — KETAMINE HCL 10 MG/ML IJ SOLN
INTRAMUSCULAR | Status: DC | PRN
  Administered 2022-10-18: 30 mg via INTRAVENOUS
  Administered 2022-10-18 (×2): 10 mg via INTRAVENOUS

## 2022-10-18 MED ORDER — LIDOCAINE HCL (PF) 2 % IJ SOLN
INTRAMUSCULAR | Status: AC
  Filled 2022-10-18: qty 5

## 2022-10-18 MED ORDER — PROPOFOL 10 MG/ML IV BOLUS
INTRAVENOUS | Status: DC | PRN
  Administered 2022-10-18: 200 mg via INTRAVENOUS

## 2022-10-18 MED ORDER — KETAMINE HCL 10 MG/ML IJ SOLN
INTRAMUSCULAR | Status: AC
  Filled 2022-10-18: qty 1

## 2022-10-18 MED ORDER — OXYCODONE HCL 5 MG/5ML PO SOLN
ORAL | Status: AC
  Filled 2022-10-18: qty 5

## 2022-10-18 MED ORDER — ROCURONIUM BROMIDE 10 MG/ML (PF) SYRINGE
PREFILLED_SYRINGE | INTRAVENOUS | Status: AC
  Filled 2022-10-18: qty 10

## 2022-10-18 MED ORDER — DEXAMETHASONE SODIUM PHOSPHATE 4 MG/ML IJ SOLN: 4.0000 mg | INTRAMUSCULAR | Status: DC

## 2022-10-18 SURGICAL SUPPLY — 76 items
APPLICATOR SURGIFLO ENDO (HEMOSTASIS) IMPLANT
BAG LAPAROSCOPIC 12 15 PORT 16 (BASKET) IMPLANT
BAG RETRIEVAL 12/15 (BASKET)
BLADE SURG SZ10 CARB STEEL (BLADE) IMPLANT
COVER BACK TABLE 60X90IN (DRAPES) ×1 IMPLANT
COVER TIP SHEARS 8 DVNC (MISCELLANEOUS) ×1 IMPLANT
COVER TIP SHEARS 8MM DA VINCI (MISCELLANEOUS) ×1
DERMABOND ADVANCED .7 DNX12 (GAUZE/BANDAGES/DRESSINGS) ×1 IMPLANT
DRAPE ARM DVNC X/XI (DISPOSABLE) ×4 IMPLANT
DRAPE COLUMN DVNC XI (DISPOSABLE) ×1 IMPLANT
DRAPE DA VINCI XI ARM (DISPOSABLE) ×4
DRAPE DA VINCI XI COLUMN (DISPOSABLE) ×1
DRAPE SHEET LG 3/4 BI-LAMINATE (DRAPES) ×1 IMPLANT
DRAPE SURG IRRIG POUCH 19X23 (DRAPES) ×1 IMPLANT
DRSG OPSITE POSTOP 4X6 (GAUZE/BANDAGES/DRESSINGS) IMPLANT
DRSG OPSITE POSTOP 4X8 (GAUZE/BANDAGES/DRESSINGS) IMPLANT
ELECT PENCIL ROCKER SW 15FT (MISCELLANEOUS) IMPLANT
ELECT REM PT RETURN 15FT ADLT (MISCELLANEOUS) ×1 IMPLANT
GAUZE 4X4 16PLY ~~LOC~~+RFID DBL (SPONGE) ×1 IMPLANT
GLOVE BIO SURGEON STRL SZ 6 (GLOVE) ×4 IMPLANT
GLOVE BIO SURGEON STRL SZ 6.5 (GLOVE) ×1 IMPLANT
GLOVE BIOGEL PI IND STRL 6.5 (GLOVE) ×2 IMPLANT
GOWN STRL REUS W/ TWL LRG LVL3 (GOWN DISPOSABLE) ×4 IMPLANT
GOWN STRL REUS W/TWL LRG LVL3 (GOWN DISPOSABLE) ×4
GRASPER SUT TROCAR 14GX15 (MISCELLANEOUS) IMPLANT
HOLDER FOLEY CATH W/STRAP (MISCELLANEOUS) IMPLANT
IRRIG SUCT STRYKERFLOW 2 WTIP (MISCELLANEOUS) ×1
IRRIGATION SUCT STRKRFLW 2 WTP (MISCELLANEOUS) ×1 IMPLANT
KIT PROCEDURE DA VINCI SI (MISCELLANEOUS) ×1
KIT PROCEDURE DVNC SI (MISCELLANEOUS) IMPLANT
KIT TURNOVER KIT A (KITS) IMPLANT
LIGASURE IMPACT 36 18CM CVD LR (INSTRUMENTS) IMPLANT
MANIPULATOR ADVINCU DEL 3.0 PL (MISCELLANEOUS) IMPLANT
MANIPULATOR ADVINCU DEL 3.5 PL (MISCELLANEOUS) IMPLANT
MANIPULATOR UTERINE 4.5 ZUMI (MISCELLANEOUS) IMPLANT
NDL HYPO 21X1.5 SAFETY (NEEDLE) ×1 IMPLANT
NDL INSUFFLATION 14GA 120MM (NEEDLE) IMPLANT
NDL SPNL 18GX3.5 QUINCKE PK (NEEDLE) IMPLANT
NEEDLE HYPO 21X1.5 SAFETY (NEEDLE) ×1 IMPLANT
NEEDLE INSUFFLATION 14GA 120MM (NEEDLE) IMPLANT
NEEDLE SPNL 18GX3.5 QUINCKE PK (NEEDLE) ×1 IMPLANT
OBTURATOR OPTICAL STANDARD 8MM (TROCAR) ×1
OBTURATOR OPTICAL STND 8 DVNC (TROCAR) ×1
OBTURATOR OPTICALSTD 8 DVNC (TROCAR) ×1 IMPLANT
PACK ROBOT GYN CUSTOM WL (TRAY / TRAY PROCEDURE) ×1 IMPLANT
PAD ARMBOARD 7.5X6 YLW CONV (MISCELLANEOUS) ×1 IMPLANT
PAD POSITIONING PINK XL (MISCELLANEOUS) ×1 IMPLANT
PORT ACCESS TROCAR AIRSEAL 12 (TROCAR) IMPLANT
SEAL CANN UNIV 5-8 DVNC XI (MISCELLANEOUS) ×4 IMPLANT
SEAL XI 5MM-8MM UNIVERSAL (MISCELLANEOUS) ×4
SET TRI-LUMEN FLTR TB AIRSEAL (TUBING) ×1 IMPLANT
SOL PREP POV-IOD 4OZ 10% (MISCELLANEOUS) ×2 IMPLANT
SPIKE FLUID TRANSFER (MISCELLANEOUS) ×1 IMPLANT
SPONGE T-LAP 18X18 ~~LOC~~+RFID (SPONGE) IMPLANT
SURGIFLO W/THROMBIN 8M KIT (HEMOSTASIS) IMPLANT
SUT MNCRL AB 4-0 PS2 18 (SUTURE) IMPLANT
SUT PDS AB 1 TP1 96 (SUTURE) IMPLANT
SUT VIC AB 0 CT1 27 (SUTURE)
SUT VIC AB 0 CT1 27XBRD ANTBC (SUTURE) IMPLANT
SUT VIC AB 2-0 CT1 27 (SUTURE)
SUT VIC AB 2-0 CT1 TAPERPNT 27 (SUTURE) IMPLANT
SUT VIC AB 4-0 PS2 18 (SUTURE) ×2 IMPLANT
SUT VLOC 180 0 9IN  GS21 (SUTURE)
SUT VLOC 180 0 9IN GS21 (SUTURE) IMPLANT
SYR 10ML LL (SYRINGE) IMPLANT
SYS BAG RETRIEVAL 10MM (BASKET)
SYS WOUND ALEXIS 18CM MED (MISCELLANEOUS)
SYSTEM BAG RETRIEVAL 10MM (BASKET) IMPLANT
SYSTEM WOUND ALEXIS 18CM MED (MISCELLANEOUS) IMPLANT
TOWEL OR NON WOVEN STRL DISP B (DISPOSABLE) IMPLANT
TRAP SPECIMEN MUCUS 40CC (MISCELLANEOUS) IMPLANT
TRAY FOLEY MTR SLVR 16FR STAT (SET/KITS/TRAYS/PACK) ×1 IMPLANT
TROCAR PORT AIRSEAL 5X120 (TROCAR) IMPLANT
UNDERPAD 30X36 HEAVY ABSORB (UNDERPADS AND DIAPERS) ×2 IMPLANT
WATER STERILE IRR 1000ML POUR (IV SOLUTION) ×1 IMPLANT
YANKAUER SUCT BULB TIP 10FT TU (MISCELLANEOUS) IMPLANT

## 2022-10-18 NOTE — Discharge Instructions (Addendum)
AFTER SURGERY INSTRUCTIONS   Return to work: 4-6 weeks if applicable   Activity: 1. Be up and out of the bed during the day.  Take a nap if needed.  You may walk up steps but be careful and use the hand rail.  Stair climbing will tire you more than you think, you may need to stop part way and rest.    2. No lifting or straining for 6 weeks over 10 pounds. No pushing, pulling, straining for 6 weeks.   3. No driving for around 1 week(s).  Do not drive if you are taking narcotic pain medicine and make sure that your reaction time has returned.    4. You can shower as soon as the next day after surgery. Shower daily.  Use your regular soap and water (not directly on the incision) and pat your incision(s) dry afterwards; don't rub.  No tub baths or submerging your body in water until cleared by your surgeon. If you have the soap that was given to you by pre-surgical testing that was used before surgery, you do not need to use it afterwards because this can irritate your incisions.    5. No sexual activity and nothing in the vagina for 12 weeks.   6. You may experience a small amount of clear drainage from your incisions, which is normal.  If the drainage persists, increases, or changes color please call the office.   7. Do not use creams, lotions, or ointments such as neosporin on your incisions after surgery until advised by your surgeon because they can cause removal of the dermabond glue on your incisions.     8. You may experience vaginal spotting after surgery or around the 6-8 week mark from surgery when the stitches at the top of the vagina begin to dissolve.  The spotting is normal but if you experience heavy bleeding, call our office.   9. Take Tylenol or ibuprofen first for pain if you are able to take these medications and only use narcotic pain medication for severe pain not relieved by the Tylenol or Ibuprofen.  Monitor your Tylenol intake to a max of 4,000 mg in a 24 hour period. You  can alternate these medications after surgery.   Diet: 1. Low sodium Heart Healthy Diet is recommended but you are cleared to resume your normal (before surgery) diet after your procedure.   2. It is safe to use a laxative, such as Miralax or Colace, if you have difficulty moving your bowels. You have been prescribed Sennakot-S to take at bedtime every evening after surgery to keep bowel movements regular and to prevent constipation.     Wound Care: 1. Keep clean and dry.  Shower daily.   Reasons to call the Doctor: Fever - Oral temperature greater than 100.4 degrees Fahrenheit Foul-smelling vaginal discharge Difficulty urinating Nausea and vomiting Increased pain at the site of the incision that is unrelieved with pain medicine. Difficulty breathing with or without chest pain New calf pain especially if only on one side Sudden, continuing increased vaginal bleeding with or without clots.   Contacts: For questions or concerns you should contact:   Dr. Bernadene Bell at [facility], NP at [phone]   After Hours: call [phone] and have the GYN Oncologist paged/contacted (after 5 pm or on the weekends).   Messages sent via mychart are for non-urgent matters and are not responded to after hours so for urgent needs, please call the after hours number.

## 2022-10-18 NOTE — Telephone Encounter (Signed)
Jeneen Rinks, Pt's husband, called stating he missed a call from [facility]. He thinks this is an update on how surgery went today. Please call back at [phone] He is aware Ernestina Patches is in the OR today and it may be later in the afternoon before she returns call. He voiced an understanding

## 2022-10-18 NOTE — Interval H&P Note (Signed)
History and Physical Interval Note:  10/18/2022 7:13 AM  Whitney Davenport  has presented today for surgery, with the diagnosis of ENDOMETRIAL CANCER.  The various methods of treatment have been discussed with the patient and family. After consideration of risks, benefits and other options for treatment, the patient has consented to  Procedure(s): XI ROBOTIC ASSISTED TOTAL HYSTERECTOMY WITH BILATERAL SALPINGO OOPHORECTOMY (N/A) SENTINEL NODE BIOPSY (N/A) POSSIBLE LYMPH NODE DISSECTION (N/A) POSSIBLE LAPAROTOMY (N/A) as a surgical intervention.  The patient's history has been reviewed, patient examined, no change in status, stable for surgery.  I have reviewed the patient's chart and labs.  Questions were answered to the patient's satisfaction.     [name]

## 2022-10-18 NOTE — Anesthesia Postprocedure Evaluation (Signed)
Anesthesia Post Note  Patient: SHANDY CHECO  Procedure(s) Performed: XI ROBOTIC ASSISTED TOTAL HYSTERECTOMY WITH BILATERAL SALPINGO OOPHORECTOMY SENTINEL NODE BIOPSY     Patient location during evaluation: PACU Anesthesia Type: General Level of consciousness: awake Pain management: pain level controlled Vital Signs Assessment: post-procedure vital signs reviewed and stable Respiratory status: spontaneous breathing, nonlabored ventilation and respiratory function stable Cardiovascular status: blood pressure returned to baseline and stable Postop Assessment: no apparent nausea or vomiting Anesthetic complications: no   No notable events documented.  Last Vitals:  Vitals:   10/18/22 1100 10/18/22 1115  BP: (!) 142/86 133/73  Pulse: 83 83  Resp: 13   Temp: 36.5 C   SpO2: 95% 95%    Last Pain:  Vitals:   10/18/22 1115  TempSrc:   PainSc: 3                  Nilda Simmer

## 2022-10-18 NOTE — Anesthesia Procedure Notes (Signed)
Procedure Name: Intubation Date/Time: 10/18/2022 7:36 AM  Performed by: Sharlette Dense, CRNAPatient Re-evaluated:Patient Re-evaluated prior to induction Oxygen Delivery Method: Circle system utilized Preoxygenation: Pre-oxygenation with 100% oxygen Induction Type: IV induction Ventilation: Mask ventilation without difficulty and Oral airway inserted - appropriate to patient size Laryngoscope Size: Sabra Heck and 2 Grade View: Grade I Tube type: Oral Tube size: 7.5 mm Number of attempts: 1 Airway Equipment and Method: Stylet Placement Confirmation: ETT inserted through vocal cords under direct vision, positive ETCO2 and breath sounds checked- equal and bilateral Secured at: 22 cm Tube secured with: Tape Dental Injury: Teeth and Oropharynx as per pre-operative assessment

## 2022-10-18 NOTE — Op Note (Signed)
GYNECOLOGIC ONCOLOGY OPERATIVE NOTE  Date of Service: 10/18/2022  Preoperative Diagnosis: ENDOMETRIAL CANCER  Postoperative Diagnosis: Same  Procedures: Robotic-assisted total laparoscopic hysterectomy, bilateral salpingo-oophorectomy, bilateral sentinel lymph node evaluation and biopsy  Surgeon: Bernadene Bell, MD  Assistants: Joylene John, NP  Anesthesia: General  Estimated Blood Loss: 25 mL    Fluids: see anesthesia record  Urine Output: 250 ml, clear yellow  Findings: Normal upper abdominal survey with normal liver surface and diaphragm. Normal appearing small and large bowel. Adhesions of ascending colon to right paracolic gutter. Adhesions of sigmoid colon to left pelvic sidewall. Normal uterus, tubes, and ovaries. Adhesions of the bladder to the anterior lower uterine segment. No evidence of peritoneal disease, ascites, or carcinomatosis. Sentinel mapping on right to the external iliac vein; sentinel mapping on left to the obturator.   Specimens:  ID Type Source Tests Collected by Time Destination  1 : Right Externel Iliac Vein Sentinel Lymph Node Tissue PATH Sentinel Lymph Node SURGICAL PATHOLOGY Bernadene Bell, MD 10/18/2022 [phone]   2 : Left Obturator Sentinel Lymph Node Tissue PATH Sentinel Lymph Node SURGICAL PATHOLOGY Bernadene Bell, MD 10/18/2022 [phone]   3 : Uterus, Cervix, Bilateral Fallopian Tubes and Ovaries Tissue PATH Gyn tumor resection SURGICAL PATHOLOGY Bernadene Bell, MD 11/21/971 5329     Complications:  None  Indications for Procedure: Whitney Davenport is a 57 y.o. woman with endometrial cancer.  Prior to the procedure, all risks, benefits, and alternatives were discussed and informed surgical consent was signed.  Procedure: Patient was taken to the operating room where general anesthesia was achieved.  She was positioned in dorsal lithotomy and prepped and draped.  A foley catheter was inserted into the bladder. 1 ml of dilute Indo-Cyanine dye was was  injected at 1cm and 76m deep at 3 and 9 o'clock in the cervical stroma.  The cervix was dilated and an Advincula uterine manipulator with a colpotomy ring was inserted into the uterus.  A 10 mm incision was made in the left upper quadrant near Palmer's point.  The abdomen was entered with a 5 mm OptiView trocar under direct visualization.  The abdomen was insufflated, the patient placed in steep Trendelenburg, and additional trocars were placed as follows: an 852mtrocar superior to the umbilicus, two 8 mm robotic trocars in the right abdomen, and one 8 mm robotic trocar in the left abdomen.  The left upper quadrant trocar was removed and replaced with a 12 mm airseal trocar.  All trocars were placed under direct visualization.  The bowels were moved into the upper abdomen.  The DaVinci robotic surgical system was brought to the patient's bedside and docked.  The right round ligament was transected and the retroperitoneum entered.The right ureter was identified. The paravesical and pararectal spaces were opened, and the node was found to be located on the external iliac vein. A sentinel lymph node dissection was performed taking care to avoid injury to the ureter, superior vesicle artery or obturator nerve. A similar procedure was performed on left with the sentinel lymph node noted on the obturator. Adhesions of the sigmoid colon to the left pelvic sidewall were lysed with scissors. The sentinel lymph nodes mentioned above were identified and removed through the assistant trocar.  The left ureter was again identified, and the left infundibulopelvic ligament was isolated, cauterized, and transected. The posterior peritoneum was opened to the colpotomy ring. The anterior peritoneum was opened and the bladder flap was created. The left uterine artery was skeletonized, cauterized, and transected  at the level of the colpotomy ring. Additional cautery was used in a C-shaped fashion to allow the remainder of the  broad, cardinal, and uterosacral ligaments with the uterine vessels to be transected and fall away from the colpotomy ring.  A similar procedure was performed on the contralateral side. A colpotomy was made circumferentially following the contours of the colpotomy ring.  The uterine specimen was removed through the vagina.    The vaginal cuff was closed with a running stitch of 0 Vicryl suture.  The pelvis was irrigated and all operative sites were found to be hemostatic.  All instruments were removed and the robot was taken from the patient's bedside.   The fascia at the 12 mm incision was closed with 0 Vicryl with use of a PMI device. The abdomen was desufflated and all ports were removed. The skin at all incisions was closed with 4-0 Vicryl to reapproximate the subcutaneous tissue and 4-0 monocryl in a subcuticular fashion followed by surgical glue.  Patient tolerated the procedure well. Sponge, lap, and instrument counts were correct.  Patient received 2 gm of Ancef prior to skin incision for routine perioperative antibiotic prophylaxis.  She was extubated and taken to the PACU in stable condition.  Bernadene Bell, MD Gynecologic Oncology

## 2022-10-18 NOTE — Transfer of Care (Signed)
Immediate Anesthesia Transfer of Care Note  Patient: Whitney Davenport  Procedure(s) Performed: XI ROBOTIC ASSISTED TOTAL HYSTERECTOMY WITH BILATERAL SALPINGO OOPHORECTOMY SENTINEL NODE BIOPSY  Patient Location: PACU  Anesthesia Type:General  Level of Consciousness: drowsy  Airway & Oxygen Therapy: Patient Spontanous Breathing and Patient connected to face mask oxygen  Post-op Assessment: Report given to RN and Post -op Vital signs reviewed and stable  Post vital signs: Reviewed and stable  Last Vitals:  Vitals Value Taken Time  BP 144/86 10/18/22 1002  Temp    Pulse 79 10/18/22 1003  Resp 16 10/18/22 1003  SpO2 100 % 10/18/22 1003  Vitals shown include unvalidated device data.  Last Pain:  Vitals:   10/18/22 0552  TempSrc: Oral  PainSc: 0-No pain         Complications: No notable events documented.

## 2022-10-19 ENCOUNTER — Telehealth: Payer: Self-pay | Admitting: Surgery

## 2022-10-19 ENCOUNTER — Encounter (HOSPITAL_COMMUNITY): Payer: Self-pay | Admitting: Psychiatry

## 2022-10-19 LAB — SURGICAL PATHOLOGY

## 2022-10-19 NOTE — Telephone Encounter (Signed)
Spoke with Whitney Davenport this morning. She states she is eating, drinking and urinating well. She has not had a BM yet but is passing gas. She is taking senokot as prescribed and encouraged her to drink plenty of water. She denies fever or chills. Incisions are dry and intact. She rates her pain 2/10. Her pain is controlled with Tramadol.  Instructed to call office with any fever, chills, purulent drainage, uncontrolled pain or any other questions or concerns. Patient verbalizes understanding.   Pt aware of post op appointments as well as the office number [phone] and after hours number [phone] to call if she has any questions or concerns

## 2022-10-21 ENCOUNTER — Telehealth: Payer: Self-pay | Admitting: Licensed Clinical Social Worker

## 2022-10-21 NOTE — Telephone Encounter (Signed)
[facility] [employer] representative attempted to contact pt by phone to check on coping post-surgery. No answer. Left VM with direct contact information.    Belissa Kooy E Katlen Seyer, LCSW

## 2022-10-24 ENCOUNTER — Telehealth: Payer: Self-pay | Admitting: Oncology

## 2022-10-24 NOTE — Telephone Encounter (Signed)
Whitney Davenport called and said she is healing well but is having pain in her upper left inner thigh near her groin.  She said it feels like it has been pulled or strained and hurts when she sits down and turns to the left or twists her leg to the left.  She also said when she goes to put her left leg down, it pulls to the left.  She is able to walk. She said the pain started after surgery and is wondering if it was from how she was positioned and is not sure what she should do to make it better.

## 2022-10-25 NOTE — Telephone Encounter (Signed)
Called [facility] back and advised that Dr. Ernestina Patches will assess the issue at her follow up on Monday.  Also advised her to try NSAIDs, ice as needed and rest (try not to over-exert).  She verbalized understanding and agreement and will try the NSAIDs, ice and rest.

## 2022-10-26 ENCOUNTER — Other Ambulatory Visit: Payer: Self-pay | Admitting: Gynecologic Oncology

## 2022-10-26 ENCOUNTER — Telehealth: Payer: Self-pay | Admitting: Oncology

## 2022-10-26 ENCOUNTER — Encounter: Payer: Self-pay | Admitting: Psychiatry

## 2022-10-26 ENCOUNTER — Other Ambulatory Visit (HOSPITAL_BASED_OUTPATIENT_CLINIC_OR_DEPARTMENT_OTHER): Payer: Self-pay

## 2022-10-26 DIAGNOSIS — L270 Generalized skin eruption due to drugs and medicaments taken internally: Secondary | ICD-10-CM

## 2022-10-26 MED ORDER — HYDROXYZINE HCL 10 MG PO TABS
10.0000 mg | ORAL_TABLET | Freq: Three times a day (TID) | ORAL | 0 refills | Status: DC | PRN
  Filled 2022-10-26: qty 30, 10d supply, fill #0

## 2022-10-26 NOTE — Telephone Encounter (Signed)
Whitney Davenport called and said her belly has been itching for the past few days.  She now has a red rash around her incisions.  She took Benadryl yesterday to help her sleep and is also using hydrocortisone cream which helps a little.  She said her incisions look good and are not draining.  She is going to take a picture and send it through [facility].

## 2022-10-26 NOTE — Telephone Encounter (Signed)
Called [place] back and she has been using dove soap - same as before surgery.  She only took Tramadol the first day after surgery and is now taking Ibuprofen.  She has not used any new detergents.  Advised her that Dr. Ernestina Patches has looked at her pictures and does not think it is related to the surgical glue or surgery.  Advised her to continue using Benadryl and hydrocortisone cream as needed and that I will check with her tomorrow morning to see how she is feeling.

## 2022-10-26 NOTE — Progress Notes (Signed)
See note from Janetta Hora.

## 2022-10-27 ENCOUNTER — Other Ambulatory Visit: Payer: Self-pay | Admitting: Gynecologic Oncology

## 2022-10-27 ENCOUNTER — Other Ambulatory Visit (HOSPITAL_BASED_OUTPATIENT_CLINIC_OR_DEPARTMENT_OTHER): Payer: Self-pay

## 2022-10-27 ENCOUNTER — Telehealth: Payer: Self-pay | Admitting: Oncology

## 2022-10-27 DIAGNOSIS — L270 Generalized skin eruption due to drugs and medicaments taken internally: Secondary | ICD-10-CM

## 2022-10-27 MED ORDER — PREDNISONE 5 MG PO TABS: ORAL_TABLET | ORAL | 0 refills | Status: DC

## 2022-10-27 MED ORDER — PREDNISONE 5 MG PO TABS
ORAL_TABLET | ORAL | 0 refills | Status: DC
  Filled 2022-10-27: qty 21, 6d supply, fill #0

## 2022-10-27 NOTE — Telephone Encounter (Signed)
Whitney Davenport called and said the Atarax helped her sleep last night but the itching is back this morning.  She is wondering if a prescription for a steroid can be sent to the [facility].    She also asked if a steroid injection would be better.  Advised usually we use the steroid dose pack.  She then asked if she could also use a steroid cream..

## 2022-10-27 NOTE — Progress Notes (Signed)
See Whitney Putt RN note. Itching and abdominal rash continues. Plan for prednisone dose pack.

## 2022-10-27 NOTE — Telephone Encounter (Signed)
Called Zabella back and let her know the prescription for prednisone has been sent to her pharmacy.

## 2022-10-31 ENCOUNTER — Encounter: Payer: Self-pay | Admitting: Oncology

## 2022-10-31 ENCOUNTER — Other Ambulatory Visit: Payer: Self-pay

## 2022-10-31 ENCOUNTER — Inpatient Hospital Stay: Payer: 59 | Attending: Psychiatry | Admitting: Psychiatry

## 2022-10-31 VITALS — BP 126/82 | HR 96 | Temp 97.9°F | Resp 16 | Wt 227.0 lb

## 2022-10-31 DIAGNOSIS — Z90710 Acquired absence of both cervix and uterus: Secondary | ICD-10-CM | POA: Insufficient documentation

## 2022-10-31 DIAGNOSIS — C541 Malignant neoplasm of endometrium: Secondary | ICD-10-CM | POA: Insufficient documentation

## 2022-10-31 DIAGNOSIS — Z7189 Other specified counseling: Secondary | ICD-10-CM

## 2022-10-31 DIAGNOSIS — G5722 Lesion of femoral nerve, left lower limb: Secondary | ICD-10-CM | POA: Diagnosis not present

## 2022-10-31 DIAGNOSIS — Z90722 Acquired absence of ovaries, bilateral: Secondary | ICD-10-CM | POA: Diagnosis not present

## 2022-10-31 NOTE — Progress Notes (Signed)
Called [facility] and requested MMR/MSI and P53 on accession [id number].  Also faxed request to [phone] attn: Wilburn Cornelia or Angie.

## 2022-10-31 NOTE — Patient Instructions (Signed)
It was a pleasure to see you in clinic today. - You are healing well. - No lifting >10lbs until 6 weeks after surgery but walking is good. - Nothing in the vaginal until 8 weeks after surgery. - Return visit planned for 6 months.  Thank you very much for allowing me to provide care for you today.  I appreciate your confidence in choosing our Gynecologic Oncology team at [facility].  If you have any questions about your visit today please call our office or send [place] a MyChart message and we will get back to you as soon as possible.

## 2022-10-31 NOTE — Progress Notes (Signed)
Gynecologic Oncology Return Clinic Visit  Date of Service: 10/31/2022 Referring Provider: Azucena Fallen, MD   Assessment & Plan: Whitney Davenport is a 57 y.o. woman with Stage IA1 FIGO grade 1 endometrioid endometrial cancer (no MI, no LVSI) who is 2 weeks s/p Robotic-assisted total laparoscopic hysterectomy, bilateral salpingo-oophorectomy, bilateral sentinel lymph node evaluation and biopsy  on 10/18/22.  Postop: - Pt recovering well from surgery and healing appropriately postoperatively - Intraoperative findings and pathology results reviewed. - Ongoing postoperative expectations and precautions reviewed. Continue with no lifting >10lbs through 6 weeks postoperatively. Nothing in the vagina x8 weeks.   Endometrial cancer: - Pathology results reviewed in detail - No high-intermediate risk factors. - Recommend observation. - Signs/symptoms of recurrence reviewed. - Surveillance reviewed. Follow-up q6 months x2 years then yearly.  - History of high grade cervical dysplasia, last with a LEEP in 1998. Given 25 years since treatment with apparent normal paps since, and normal cervix on pathology, no additional pap smears required. - MMR and p53 pending (will request on biopsy if unable to be performed on uterine specimen given so focal).   Neuropathy: - Reviewed that she may have had stretch of her left femoral nerve due to positioning in the OR. - Improving without intervention. - PT referral offered. Pt declines at this time but will let us know if she decides otherwise.    RTC 6 months.  Bernadene Bell, MD Gynecologic Oncology   Medical Decision Making I personally spent  TOTAL 35 minutes face-to-face and non-face-to-face in the care of this patient, which includes all pre, intra, and post visit time on the date of service.  ----------------------- Reason for Visit: Postop/Treatment counseling  Treatment History: Oncology History  Endometrial cancer ([place])  09/07/2022  Imaging   Pelvic ultrasound: Uterus measuring 8.3 x 5.1 x 4.3 cm. Endometrial stripe 11.6 mm No adnexal abnormalities   09/13/2022 Pathology Results   Endometrial biopsy: Well-differentiated endometrial adenocarcinoma, FIGO 1   09/13/2022 Initial Diagnosis   Endometrial cancer ([facility])     Interval History: Pt reports that she is recovering well from surgery. She is using no longer needing meds for pain. She is eating and drinking well. She is voiding without issue and having regular bowel movements. She had experiencing a broad rash a few days following her surgery which she reports resolved with steroids. She does note some left leg weakness that is improving. She reports it is predominantly when she is getting up from a seated position (like pushing up when getting off of the toilet).     Past Medical/Surgical History: Past Medical History:  Diagnosis Date   Allergy    Anemia    hx of   Anxiety    Back injury    "ruptured disc-no problems since surgery"   Back pain    Cancer ([place])    endometrial cancer   Constipation    Depression    GERD (gastroesophageal reflux disease)    occ   Hepatitis 2004   hx of hep c-"interferon treatment, does not show up on blood test anymore"   History of kidney stones    Hyperlipidemia    Hypotension    no fainting in years   Hypothyroidism    not on meds yet, has appt at [facility]   Infertility, female    Lumbar disc disease    Osteoarthritis    Vitamin D deficiency     Past Surgical History:  Procedure Laterality Date   [place]  DIAGNOSTIC LAPAROSCOPY  last 2001   for endometriosis, couple of surgeries   GANGLION CYST EXCISION Left age 16   left hand   LEEP  1998   LUMBAR LAMINECTOMY/DECOMPRESSION MICRODISCECTOMY  11/03/2011   Procedure: LUMBAR LAMINECTOMY/DECOMPRESSION MICRODISCECTOMY;  Surgeon: Hosie Spangle, MD;  Location: [facility];  Service: Neurosurgery;  Laterality: Left;  LEFT Lumbar five sacral  one laminotomy and microdiskectomy   ROBOTIC ASSISTED TOTAL HYSTERECTOMY WITH BILATERAL SALPINGO OOPHERECTOMY N/A 10/18/2022   Procedure: XI ROBOTIC ASSISTED TOTAL HYSTERECTOMY WITH BILATERAL SALPINGO OOPHORECTOMY;  Surgeon: Bernadene Bell, MD;  Location: [facility];  Service: Gynecology;  Laterality: N/A;   SENTINEL NODE BIOPSY N/A 10/18/2022   Procedure: SENTINEL NODE BIOPSY;  Surgeon: Bernadene Bell, MD;  Location: [facility];  Service: Gynecology;  Laterality: N/A;   TOTAL HIP ARTHROPLASTY Right 02/03/2015   Procedure: RIGHT TOTAL HIP ARTHROPLASTY ANTERIOR APPROACH;  Surgeon: Paralee Cancel, MD;  Location: [facility];  Service: Orthopedics;  Laterality: Right;    Family History  Problem Relation Age of Onset   Thyroid disease Mother    Diabetes Father    Hyperlipidemia Father    Obesity Father    Lung cancer Maternal Grandmother    Colon cancer Neg Hx    Breast cancer Neg Hx    Ovarian cancer Neg Hx    Endometrial cancer Neg Hx    Prostate cancer Neg Hx    Pancreatic cancer Neg Hx     Social History   Socioeconomic History   Marital status: Married    Spouse name: Estill Dooms   Number of children: 1   Years of education: Not on file   Highest education level: Not on file  Occupational History   Occupation: Patient Theatre manager  Tobacco Use   Smoking status: Former    Years: 15.00    Types: Cigarettes    Quit date: 10/31/2010    Years since quitting: 12.0   Smokeless tobacco: Never  Vaping Use   Vaping Use: Never used  Substance and Sexual Activity   Alcohol use: Yes    Comment: rare   Drug use: No   Sexual activity: Not Currently    Partners: Male  Other Topics Concern   Not on file  Social History Narrative   Not on file   Social Determinants of Health   Financial Resource Strain: Not on file  Food Insecurity: No Food Insecurity (10/05/2022)   Hunger Vital Sign    Worried About Running Out of Food in the Last Year: Never true    Ran Out of Food in the Last Year:  Never true  Transportation Needs: No Transportation Needs (10/05/2022)   PRAPARE - Hydrologist (Medical): No    Lack of Transportation (Non-Medical): No  Physical Activity: Not on file  Stress: Not on file  Social Connections: Not on file    Current Medications:  Current Outpatient Medications:    cetirizine (ZYRTEC) 10 MG tablet, Take 10 mg by mouth at bedtime. , Disp: , Rfl:    clonazePAM (KLONOPIN) 0.5 MG tablet, Take 0.5 -1 tablet by mouth daily as needed., Disp: 30 tablet, Rfl: 2   predniSONE (DELTASONE) 5 MG tablet, Day1: 10 mg (2 tablets) before breakfast, 5 mg at lunch, 5 mg at dinner, 10 mg (2 tablets) at bedtime Day2: 5 mg at breakfast, 5 mg at lunch, 5 mg at dinner, 10 mg (2 tablets) at bedtime Day3: 5 mg four times daily (with meals and  at bedtime) Day4: 5 mg three times daily (with meals) Day5: 5 mg two times daily (breakfast, bedtime) Day 6: 5 mg before breakfast, Disp: 21 tablet, Rfl: 0   senna-docusate (SENOKOT-S) 8.6-50 MG tablet, Take 2 tablets by mouth at bedtime. For AFTER surgery, do not take if having diarrhea, Disp: 30 tablet, Rfl: 0   sertraline (ZOLOFT) 100 MG tablet, Take 1 tablet by mouth once a day, Disp: 90 tablet, Rfl: 1   simvastatin (ZOCOR) 40 MG tablet, Take 1 tablet by mouth once a day, Disp: 90 tablet, Rfl: 1  Review of Symptoms: Complete 10-system review is negative except as above in Interval History.  Physical Exam: BP 126/82 (BP Location: Right Arm, Patient Position: Sitting)   Pulse 96   Temp 97.9 F (36.6 C) (Oral)   Resp 16   Wt 227 lb (103 kg)   SpO2 99%   BMI 35.55 kg/m  General: Alert, oriented, no acute distress. HEENT: Normocephalic, atraumatic. Neck symmetric without masses. Sclera anicteric.  Chest: Normal work of breathing. Clear to auscultation bilaterally.   Cardiovascular: Regular rate and rhythm, no murmurs. Abdomen: Soft, nontender.  Normoactive bowel sounds.  No masses or hepatosplenomegaly  appreciated.  Well-healing incisions with glue in place. Extremities: Grossly normal range of motion.  Warm, well perfused.  No edema bilaterally. Skin: No rashes or lesions noted. Neuro: 5/5 strength in right leg for hip flexion/extension, knee extension/flexion and ante/dorsiflexion. On left 5/5 strength of leg except 4/5 for left hip flexion.  GU: Normal appearing external genitalia without erythema, excoriation, or lesions.  Speculum exam reveals normal vaginal mucosa and well healing vaginal cuff.  Bimanual exam reveals intact vaginal cuff without tenderness or fluctuance.. Exam chaperoned by [facility] Rad[facility] Studies: FINAL MICROSCOPIC DIAGNOSIS:  A. SENTINEL LYMPH NODE, RIGHT EXTERNAL ILIAC VEIN, EXCISION: -  1 lymph node negative for malignancy (0/1).  B. SENTINEL LYMPH NODE, LEFT OBTURATOR, EXCISION: -  1 lymph node negative for malignancy (0/1).  C. UTERUS, CERVIX, BILATERAL FALLOPIAN TUBES AND OVARIES: -  Endometrium with predominantly endometrial intraepithelial neoplasia with small foci approaching endometrioid carcinoma, FIGO grade 1 of 3 exclusively present in the endometrium without evidence of myometrial invasion. -  Unremarkable cervix, negative for dysplasia. -  Myometrium with the presence of adenomyosis. -  Unremarkable bilateral fallopian tubes. -  Unremarkable bilateral senescent appearing ovaries. pT1a pN0; FIGO Stage 1A  ONCOLOGY TABLE:  UTERUS, CARCINOMA OR CARCINOSARCOMA: Resection  Procedure: Robotic assisted total hysterectomy with bilateral salpingo-oophorectomy Histologic Type: Endometrioid carcinoma in the background of EIN Histologic Grade: FIGO grade 1 of 3 Myometrial Invasion:      Depth of Myometrial Invasion (mm): 0      Myometrial Thickness (mm): N/A      Percentage of Myometrial Invasion: 0% Uterine Serosa Involvement: Not identified Cervical stromal Involvement: Not identified Extent of involvement of other tissue/organs:  Not identified Peritoneal/Ascitic Fluid: N/A Lymphovascular Invasion: Not identified Regional Lymph Nodes:      Pelvic Lymph Nodes Examined (right external iliac vein and left obturator:                2 Sentinel                               0 non-sentinel  2 total      Pelvic Lymph Nodes with Metastasis: 0                          Macrometastasis: (>2.0 mm): N/A                         Micrometastasis: (>0.2 mm and < 2.0 mm): N/A                           Isolated Tumor Cells (<0.2 mm): N/A                      Laterality of Lymph Node with Tumor: N/A                            Extracapsular Extension: N/A      Para-aortic Lymph Nodes Examined:                0 Sentinel                                   0 non-sentinel                                   0 total      Para-aortic Lymph Nodes with Metastasis: N/A                          Macrometastasis: (>2.0 mm): N/A                         Micrometastasis:  (>0.2 mm and < 2.0 mm): N/A                           Isolated Tumor Cells (<0.2 mm): N/A                      Laterality of Lymph Node with Tumor: N/A                            Extracapsular Extension: N/A Distant Metastasis:      Distant Site(s) Involved: N/A Pathologic Stage Classification (pTNM, AJCC 8th Edition): pT1a pN0; FIGO stage IA Ancillary Studies: Not performed; limited carcinoma in the background of EIN (can be performed on clinician request; however, possibly better performed on diagnostic biopsy). Representative Tumor Block: C3 Comment(s):

## 2022-11-06 ENCOUNTER — Encounter: Payer: Self-pay | Admitting: Psychiatry

## 2022-11-11 ENCOUNTER — Telehealth: Payer: Self-pay | Admitting: Oncology

## 2022-11-11 NOTE — Telephone Encounter (Signed)
Left a message for [facility] regarding MSI, MMR and P53 testing on accession [id number].

## 2022-11-15 ENCOUNTER — Other Ambulatory Visit (HOSPITAL_COMMUNITY): Payer: Self-pay

## 2022-11-15 DIAGNOSIS — E782 Mixed hyperlipidemia: Secondary | ICD-10-CM | POA: Diagnosis not present

## 2022-11-15 DIAGNOSIS — E039 Hypothyroidism, unspecified: Secondary | ICD-10-CM | POA: Diagnosis not present

## 2022-11-15 DIAGNOSIS — Z96641 Presence of right artificial hip joint: Secondary | ICD-10-CM | POA: Diagnosis not present

## 2022-11-15 DIAGNOSIS — R7303 Prediabetes: Secondary | ICD-10-CM | POA: Diagnosis not present

## 2022-11-15 DIAGNOSIS — Z8619 Personal history of other infectious and parasitic diseases: Secondary | ICD-10-CM | POA: Diagnosis not present

## 2022-11-15 DIAGNOSIS — J309 Allergic rhinitis, unspecified: Secondary | ICD-10-CM | POA: Diagnosis not present

## 2022-11-15 DIAGNOSIS — E559 Vitamin D deficiency, unspecified: Secondary | ICD-10-CM | POA: Diagnosis not present

## 2022-11-15 DIAGNOSIS — N951 Menopausal and female climacteric states: Secondary | ICD-10-CM | POA: Diagnosis not present

## 2022-11-15 DIAGNOSIS — N1831 Chronic kidney disease, stage 3a: Secondary | ICD-10-CM | POA: Diagnosis not present

## 2022-11-15 DIAGNOSIS — F419 Anxiety disorder, unspecified: Secondary | ICD-10-CM | POA: Diagnosis not present

## 2022-11-15 MED ORDER — SERTRALINE HCL 100 MG PO TABS
100.0000 mg | ORAL_TABLET | Freq: Every day | ORAL | 1 refills | Status: DC
  Filled 2022-12-25 – 2022-12-26 (×2): qty 90, 90d supply, fill #0
  Filled 2023-03-20: qty 90, 90d supply, fill #1

## 2022-11-15 MED ORDER — SIMVASTATIN 40 MG PO TABS
40.0000 mg | ORAL_TABLET | Freq: Every day | ORAL | 1 refills | Status: DC
  Filled 2023-01-04: qty 90, 90d supply, fill #0
  Filled 2023-04-03: qty 90, 90d supply, fill #1

## 2022-11-15 MED ORDER — CLONAZEPAM 0.5 MG PO TABS
0.2500 mg | ORAL_TABLET | Freq: Every day | ORAL | 2 refills | Status: DC | PRN
  Filled 2022-11-15: qty 30, 30d supply, fill #0

## 2022-11-21 ENCOUNTER — Telehealth: Payer: Self-pay | Admitting: Oncology

## 2022-11-21 NOTE — Telephone Encounter (Signed)
Left a message for Angie at [facility] regarding MMR, MSI and P53 results.  Requested a return call.

## 2022-11-24 ENCOUNTER — Encounter: Payer: Self-pay | Admitting: Psychiatry

## 2022-11-28 DIAGNOSIS — C541 Malignant neoplasm of endometrium: Secondary | ICD-10-CM | POA: Diagnosis not present

## 2022-11-28 DIAGNOSIS — E559 Vitamin D deficiency, unspecified: Secondary | ICD-10-CM | POA: Diagnosis not present

## 2022-11-28 DIAGNOSIS — R7303 Prediabetes: Secondary | ICD-10-CM | POA: Diagnosis not present

## 2022-11-28 DIAGNOSIS — N951 Menopausal and female climacteric states: Secondary | ICD-10-CM | POA: Diagnosis not present

## 2022-11-28 DIAGNOSIS — N1831 Chronic kidney disease, stage 3a: Secondary | ICD-10-CM | POA: Diagnosis not present

## 2022-11-28 DIAGNOSIS — E782 Mixed hyperlipidemia: Secondary | ICD-10-CM | POA: Diagnosis not present

## 2022-11-28 DIAGNOSIS — R946 Abnormal results of thyroid function studies: Secondary | ICD-10-CM | POA: Diagnosis not present

## 2022-12-25 ENCOUNTER — Other Ambulatory Visit (HOSPITAL_COMMUNITY): Payer: Self-pay

## 2022-12-26 ENCOUNTER — Other Ambulatory Visit (HOSPITAL_BASED_OUTPATIENT_CLINIC_OR_DEPARTMENT_OTHER): Payer: Self-pay

## 2022-12-26 ENCOUNTER — Other Ambulatory Visit (HOSPITAL_COMMUNITY): Payer: Self-pay

## 2023-01-04 ENCOUNTER — Other Ambulatory Visit (HOSPITAL_COMMUNITY): Payer: Self-pay

## 2023-01-05 ENCOUNTER — Other Ambulatory Visit: Payer: Self-pay

## 2023-03-20 ENCOUNTER — Other Ambulatory Visit (HOSPITAL_BASED_OUTPATIENT_CLINIC_OR_DEPARTMENT_OTHER): Payer: Self-pay

## 2023-03-24 ENCOUNTER — Encounter: Payer: Self-pay | Admitting: Internal Medicine

## 2023-03-24 ENCOUNTER — Ambulatory Visit: Payer: 59 | Admitting: Internal Medicine

## 2023-03-24 VITALS — BP 128/88 | HR 78 | Ht 66.0 in | Wt 227.8 lb

## 2023-03-24 DIAGNOSIS — E038 Other specified hypothyroidism: Secondary | ICD-10-CM | POA: Diagnosis not present

## 2023-03-24 NOTE — Progress Notes (Unsigned)
Name: Whitney Davenport  MRN/ DOB: [id number], June 27, 1966    Age/ Sex: 57 y.o., female    PCP: Tally Joe, MD   Reason for Endocrinology Evaluation: Subclinical Hypothyroidism     Date of Initial Endocrinology Evaluation: 03/24/2023     HPI: Whitney Davenport is a 57 y.o. female with a past medical history of anxiety,Hx of endometrial cancer, prediabetes. The patient presented for initial endocrinology clinic visit on 03/24/2023 for consultative assistance with her Hypothyroidism.   Patient has been did with slight elevation of TSH (4.74-4.95) in 2023, with normal free T4.  Her PCP attempted to start her on levothyroxine 25 mcg in 05/2022 but she developed pruritus   She was subsequently seen by Dr. Roanna Raider (Endo) , and pruritus was attributed to the presence of dye, she was given 50 mcg dose of levothyroxine in the office and sent home, patient unfortunately developed palpitations, dizziness, worsening anxiety  She was advised after that, to take half a tablet of the 50 mcg of levothyroxine but continue to feel woozy  She does follow with GYN for hot flashes, she is s/p hysterectomy 10/2022 due to endometrial cancer.  And has noted weight gain since then  She does endorse dry skin, as well as increased hair falling She has noted red discoloration of the anterior neck, but no swelling Denies constipation or diarrhea      Mother with thyroid disease started with hyperthyroidism, required RAI   HISTORY:  Past Medical History:  Past Medical History:  Diagnosis Date   Allergy    Anemia    hx of   Anxiety    Back injury    "ruptured disc-no problems since surgery"   Back pain    Cancer ([facility])    endometrial cancer   Constipation    Depression    GERD (gastroesophageal reflux disease)    occ   Hepatitis 2004   hx of hep c-"interferon treatment, does not show up on blood test anymore"   History of kidney stones    Hyperlipidemia    Hypotension    no fainting in years    Hypothyroidism    not on meds yet, has appt at [facility]   Infertility, female    Lumbar disc disease    Osteoarthritis    Vitamin D deficiency    Past Surgical History:  Past Surgical History:  Procedure Laterality Date   CERVICAL [place] BIOPSY  1987   DIAGNOSTIC LAPAROSCOPY  last 2001   for endometriosis, couple of surgeries   GANGLION CYST EXCISION Left age 66   left hand   LEEP  1998   LUMBAR LAMINECTOMY/DECOMPRESSION MICRODISCECTOMY  11/03/2011   Procedure: LUMBAR LAMINECTOMY/DECOMPRESSION MICRODISCECTOMY;  Surgeon: Hewitt Shorts, MD;  Location: [facility];  Service: Neurosurgery;  Laterality: Left;  LEFT Lumbar five sacral one laminotomy and microdiskectomy   ROBOTIC ASSISTED TOTAL HYSTERECTOMY WITH BILATERAL SALPINGO OOPHERECTOMY N/A 10/18/2022   Procedure: XI ROBOTIC ASSISTED TOTAL HYSTERECTOMY WITH BILATERAL SALPINGO OOPHORECTOMY;  Surgeon: Clide Cliff, MD;  Location: [facility];  Service: Gynecology;  Laterality: N/A;   SENTINEL NODE BIOPSY N/A 10/18/2022   Procedure: SENTINEL NODE BIOPSY;  Surgeon: Clide Cliff, MD;  Location: [facility];  Service: Gynecology;  Laterality: N/A;   TOTAL HIP ARTHROPLASTY Right 02/03/2015   Procedure: RIGHT TOTAL HIP ARTHROPLASTY ANTERIOR APPROACH;  Surgeon: Durene Romans, MD;  Location: [facility];  Service: Orthopedics;  Laterality: Right;    Social History:  reports that she quit smoking  about 12 years ago. Her smoking use included cigarettes. She has never used smokeless tobacco. She reports current alcohol use. She reports that she does not use drugs. Family History: family history includes Diabetes in her father; Hyperlipidemia in her father; Lung cancer in her maternal grandmother; Obesity in her father; Thyroid disease in her mother.   HOME MEDICATIONS: Allergies as of 03/24/2023       Reactions   Levothyroxine Itching   Sulfa Antibiotics Nausea And Vomiting, Other (See Comments)   Causes headache with nausea   Hydrocodone Itching, Rash    Zithromax [azithromycin] Itching, Rash        Medication List        Accurate as of March 24, 2023  2:46 PM. If you have any questions, ask your nurse or doctor.          STOP taking these medications    predniSONE 5 MG tablet Commonly known as: DELTASONE Stopped by: Scarlette Shorts, MD   Stool Softener/Laxative 8.6-50 MG tablet Generic drug: senna-docusate Stopped by: Scarlette Shorts, MD       TAKE these medications    cetirizine 10 MG tablet Commonly known as: ZYRTEC Take 10 mg by mouth at bedtime.   clonazePAM 0.5 MG tablet Commonly known as: KLONOPIN Take 0.5-1 tablets (0.25-0.5 mg total) by mouth daily as needed.   sertraline 100 MG tablet Commonly known as: ZOLOFT Take 1 tablet (100 mg total) by mouth daily.   simvastatin 40 MG tablet Commonly known as: ZOCOR Take 1 tablet (40 mg total) by mouth daily.          REVIEW OF SYSTEMS: A comprehensive ROS was conducted with the patient and is negative except as per HPI and below:  ROS     OBJECTIVE:  VS: BP 128/88 (BP Location: Right Arm, Patient Position: Sitting, Cuff Size: Normal)   Pulse 78   Ht 5\' 6"  (1.676 m)   Wt 227 lb 12.8 oz (103.3 kg)   SpO2 95%   BMI 36.77 kg/m    Wt Readings from Last 3 Encounters:  03/24/23 227 lb 12.8 oz (103.3 kg)  10/31/22 227 lb (103 kg)  10/18/22 227 lb 1.2 oz (103 kg)     EXAM: General: Pt appears well and is in NAD  Eyes: External eye exam normal without stare, lid lag or exophthalmos.  EOM intact.   Neck: General: Supple without adenopathy. Thyroid: Thyroid size normal.  No goiter or nodules appreciated.  Lungs: Clear with good BS bilat   Heart: Auscultation: RRR.  Abdomen: Soft, nontender  Extremities:  BL LE: No pretibial edema   Mental Status: Judgment, insight: Intact Orientation: Oriented to time, place, and person Mood and affect: No depression, anxiety, or agitation     DATA REVIEWED: ***   Old records , labs and images  have been reviewed.   ASSESSMENT/PLAN/RECOMMENDATIONS:   Subclinical Hypothyroidism:  -TSH has been fluctuating over the past year with a max level of 4.95 u IU/mL in 2023 -She has been intolerant to levothyroxine 25 mcg and 50 mcg doses -We did discuss alternative therapy to include Synthroid,Tirosint as well as desiccated thyroid hormone.  I did explain to the patient that desiccated thyroid hormone will have combination of T3 and T4, and may cause worsening anxiety and palpitations. -At this time her levels have not been high enough to warrant LT-4 for replacement -I will also check her for TPO antibodies, I did advise the patient that if TPO antibodies elevated,  the recommendation is to keep TSH ~ 3 uIU/mL     Follow-up in 6 months   I spent 45 minutes preparing to see the patient by review of recent labs, imaging and procedures, obtaining and reviewing separately obtained history, communicating with the patient, ordering medications, tests or procedures, and documenting clinical information in the EHR including the differential Dx, treatment, and any further evaluation and other management    Signed electronically by: Lyndle Herrlich, MD  [facility] Endocrinology  [facility] [address] [place] [place], [place] [address] Phone: [phone] FAX: [phone]   CC: Tally Joe, MD [address] [place] [address] Phone: [phone] Fax: [phone]   Return to Endocrinology clinic as below: Future Appointments  Date Time Provider Department Center  09/26/2023  3:00 PM Jarmaine Ehrler, Konrad Dolores, MD [id number] None

## 2023-03-25 LAB — T3, FREE: T3, Free: 2.9 pg/mL (ref 2.3–4.2)

## 2023-03-25 LAB — TSH: TSH: 3.82 mIU/L (ref 0.40–4.50)

## 2023-03-25 LAB — T4, FREE: Free T4: 0.9 ng/dL (ref 0.8–1.8)

## 2023-03-27 LAB — THYROID PEROXIDASE ANTIBODY: Thyroperoxidase Ab SerPl-aCnc: 1 IU/mL (ref <9)

## 2023-04-25 ENCOUNTER — Telehealth: Payer: Self-pay | Admitting: *Deleted

## 2023-04-25 NOTE — Telephone Encounter (Signed)
Patient called and scheduled a follow up appt with Dr Alvester Morin on 7/22

## 2023-05-08 ENCOUNTER — Inpatient Hospital Stay: Payer: 59 | Attending: Psychiatry | Admitting: Psychiatry

## 2023-05-08 ENCOUNTER — Encounter: Payer: Self-pay | Admitting: Psychiatry

## 2023-05-08 VITALS — BP 107/66 | HR 67 | Temp 98.1°F | Resp 16 | Ht 67.0 in | Wt 229.0 lb

## 2023-05-08 DIAGNOSIS — Z90710 Acquired absence of both cervix and uterus: Secondary | ICD-10-CM | POA: Diagnosis not present

## 2023-05-08 DIAGNOSIS — C541 Malignant neoplasm of endometrium: Secondary | ICD-10-CM

## 2023-05-08 DIAGNOSIS — Z8542 Personal history of malignant neoplasm of other parts of uterus: Secondary | ICD-10-CM | POA: Diagnosis not present

## 2023-05-08 DIAGNOSIS — R232 Flushing: Secondary | ICD-10-CM

## 2023-05-08 DIAGNOSIS — Z90722 Acquired absence of ovaries, bilateral: Secondary | ICD-10-CM | POA: Insufficient documentation

## 2023-05-08 NOTE — Progress Notes (Signed)
Gynecologic Oncology Return Clinic Visit  Date of Service: 05/08/2023 Referring Provider: Shea Evans, MD   Assessment & Plan: Whitney Davenport is a 57 y.o. woman with Stage IA1 FIGO grade 1 endometrioid endometrial cancer (no MI, no LVSI, MMRp, p53wt) s/p RA-TLH, BSO, blt SLNBx on 10/18/22 who presents for surveillance.  Endometrial cancer: - NED on exam today. - Signs/symptoms of recurrence reviewed. - Continue surveillance with follow-up q6 months x2 years then yearly.  - Reviewed that after 5 years NED, will be safe to return to Ob/Gyn.  Hotflashes: - Reviewed retrospective data that suggests low risk for HRT in low risk endometrial cancer pt. - Alternatives reviewed for non hormonal treatments. - Could also consider supplement like black cohosh. Limitation is lack of regulation. - Pt wishes to hold off on HRT or non hormonal prescription medications at this time. May consider supplement.  Neuropathy: - resolved  RTC 6 months.  Clide Cliff, MD Gynecologic Oncology   Medical Decision Making I personally spent  TOTAL 20 minutes face-to-face and non-face-to-face in the care of this patient, which includes all pre, intra, and post visit time on the date of service.  ----------------------- Reason for Visit: Surveillance  Treatment History: Oncology History  Endometrial cancer (HCC)  09/07/2022 Imaging   Pelvic ultrasound: Uterus measuring 8.3 x 5.1 x 4.3 cm. Endometrial stripe 11.6 mm No adnexal abnormalities   09/13/2022 Pathology Results   Endometrial biopsy: Well-differentiated endometrial adenocarcinoma, FIGO 1   09/13/2022 Initial Diagnosis   Endometrial cancer (HCC)   10/18/2022 Surgery   Robotic-assisted total laparoscopic hysterectomy, bilateral salpingo-oophorectomy, bilateral sentinel lymph node evaluation and biopsy    10/18/2022 Pathology Results   FINAL MICROSCOPIC DIAGNOSIS:  A. SENTINEL LYMPH NODE, RIGHT EXTERNAL ILIAC VEIN, EXCISION: -  1  lymph node negative for malignancy (0/1).  B. SENTINEL LYMPH NODE, LEFT OBTURATOR, EXCISION: -  1 lymph node negative for malignancy (0/1).  C. UTERUS, CERVIX, BILATERAL FALLOPIAN TUBES AND OVARIES: -  Endometrium with predominantly endometrial intraepithelial neoplasia with small foci approaching endometrioid carcinoma, FIGO grade 1 of 3 exclusively present in the endometrium without evidence of myometrial invasion. -  Unremarkable cervix, negative for dysplasia. -  Myometrium with the presence of adenomyosis. -  Unremarkable bilateral fallopian tubes. -  Unremarkable bilateral senescent appearing ovaries. pT1a pN0; FIGO Stage 1A   10/18/2022 Cancer Staging   Staging form: Corpus Uteri - Carcinoma and Carcinosarcoma, AJCC 8th Edition - Pathologic stage from 10/18/2022: FIGO Stage IA (pT1a, pN0, cM0) - Signed by Clide Cliff, MD on 11/06/2022 Histopathologic type: Endometrioid adenocarcinoma, NOS Stage prefix: Initial diagnosis Histologic grade (G): G1 Histologic grading system: 3 grade system Lymph-vascular invasion (LVI): LVI not present (absent)/not identified     Interval History: Overall doing well. Her leg weakness resolved. She has some hotflashes that come and go. She is using a magnesium/vitamin D cream which helps with sleep. No new vaginal bleeding, abdominal/pelvic pain, unintentional weight loss, change in bowel or bladder habits, early satiety, bloating, nausea/vomiting.     Past Medical/Surgical History: Past Medical History:  Diagnosis Date   Allergy    Anemia    hx of   Anxiety    Back injury    "ruptured disc-no problems since surgery"   Back pain    Cancer (HCC)    endometrial cancer   Constipation    Depression    GERD (gastroesophageal reflux disease)    occ   Hepatitis 2004   hx of hep c-"interferon treatment, does not show  up on blood test anymore"   History of kidney stones    Hyperlipidemia    Hypotension    no fainting in years    Hypothyroidism    not on meds yet, has appt at [facility]   Infertility, female    Lumbar disc disease    Osteoarthritis    Vitamin D deficiency     Past Surgical History:  Procedure Laterality Date   CERVICAL CONE BIOPSY  1987   DIAGNOSTIC LAPAROSCOPY  last 2001   for endometriosis, couple of surgeries   GANGLION CYST EXCISION Left age 31   left hand   LEEP  1998   LUMBAR LAMINECTOMY/DECOMPRESSION MICRODISCECTOMY  11/03/2011   Procedure: LUMBAR LAMINECTOMY/DECOMPRESSION MICRODISCECTOMY;  Surgeon: Hewitt Shorts, MD;  Location: [facility];  Service: Neurosurgery;  Laterality: Left;  LEFT Lumbar five sacral one laminotomy and microdiskectomy   ROBOTIC ASSISTED TOTAL HYSTERECTOMY WITH BILATERAL SALPINGO OOPHERECTOMY N/A 10/18/2022   Procedure: XI ROBOTIC ASSISTED TOTAL HYSTERECTOMY WITH BILATERAL SALPINGO OOPHORECTOMY;  Surgeon: Clide Cliff, MD;  Location: [facility];  Service: Gynecology;  Laterality: N/A;   SENTINEL NODE BIOPSY N/A 10/18/2022   Procedure: SENTINEL NODE BIOPSY;  Surgeon: Clide Cliff, MD;  Location: [facility];  Service: Gynecology;  Laterality: N/A;   TOTAL HIP ARTHROPLASTY Right 02/03/2015   Procedure: RIGHT TOTAL HIP ARTHROPLASTY ANTERIOR APPROACH;  Surgeon: Durene Romans, MD;  Location: [facility];  Service: Orthopedics;  Laterality: Right;    Family History  Problem Relation Age of Onset   Thyroid disease Mother    Diabetes Father    Hyperlipidemia Father    Obesity Father    Lung cancer Maternal Grandmother    Colon cancer Neg Hx    Breast cancer Neg Hx    Ovarian cancer Neg Hx    Endometrial cancer Neg Hx    Prostate cancer Neg Hx    Pancreatic cancer Neg Hx     Social History   Socioeconomic History   Marital status: Married    Spouse name: Heriberto Antigua   Number of children: 1   Years of education: Not on file   Highest education level: Not on file  Occupational History   Occupation: Patient Theatre manager  Tobacco Use   Smoking status: Former     Current packs/day: 0.00    Types: Cigarettes    Start date: 11/01/1995    Quit date: 10/31/2010    Years since quitting: 12.5   Smokeless tobacco: Never  Vaping Use   Vaping status: Never Used  Substance and Sexual Activity   Alcohol use: Yes    Comment: rare   Drug use: No   Sexual activity: Not Currently    Partners: Male  Other Topics Concern   Not on file  Social History Narrative   Not on file   Social Determinants of Health   Financial Resource Strain: Not on file  Food Insecurity: No Food Insecurity (10/05/2022)   Hunger Vital Sign    Worried About Running Out of Food in the Last Year: Never true    Ran Out of Food in the Last Year: Never true  Transportation Needs: No Transportation Needs (10/05/2022)   PRAPARE - Administrator, Civil Service (Medical): No    Lack of Transportation (Non-Medical): No  Physical Activity: Not on file  Stress: Not on file  Social Connections: Not on file    Current Medications:  Current Outpatient Medications:    cetirizine (ZYRTEC) 10 MG tablet, Take 10  mg by mouth at bedtime. , Disp: , Rfl:    clonazePAM (KLONOPIN) 0.5 MG tablet, Take 0.5-1 tablets (0.25-0.5 mg total) by mouth daily as needed., Disp: 30 tablet, Rfl: 2   sertraline (ZOLOFT) 100 MG tablet, Take 1 tablet (100 mg total) by mouth daily., Disp: 90 tablet, Rfl: 1   simvastatin (ZOCOR) 40 MG tablet, Take 1 tablet (40 mg total) by mouth daily., Disp: 90 tablet, Rfl: 1  Review of Symptoms: Complete 10-system review is negative except as above in Interval History.  Physical Exam: BP 107/66 (BP Location: Right Arm, Patient Position: Sitting)   Pulse 67   Temp 98.1 F (36.7 C) (Oral)   Resp 16   Ht 5\' 7"  (1.702 m)   Wt 229 lb (103.9 kg)   SpO2 100%   BMI 35.87 kg/m  General: Alert, oriented, no acute distress. HEENT: Normocephalic, atraumatic. Neck symmetric without masses. Sclera anicteric.  Chest: Normal work of breathing. Clear to auscultation  bilaterally.   Cardiovascular: Regular rate and rhythm, no murmurs. Abdomen: Soft, nontender.  Normoactive bowel sounds.  No masses appreciated.  Well-healed incisions. Extremities: Grossly normal range of motion.  Warm, well perfused.  No edema bilaterally. Skin: No rashes or lesions noted. Lymphatics: No cervical, supraclavicular, or inguinal adenopathy. GU: Normal appearing external genitalia without erythema, excoriation, or lesions.  Speculum exam reveals normal vaginal mucosa and cuff. No lesions.  Bimanual exam reveals smooth vaginal cuff. No pelvic mass or nodularity.  Exam chaperoned by Kimberly [place], CMA   Laboratory & Radiologic Studies: None

## 2023-05-08 NOTE — Patient Instructions (Signed)
It was a pleasure to see you in clinic today. - Exam normal today. - Return visit planned for 6 months  Thank you very much for allowing me to provide care for you today.  I appreciate your confidence in choosing our Gynecologic Oncology team at [facility].  If you have any questions about your visit today please call our office or send [place] a MyChart message and we will get back to you as soon as possible.

## 2023-06-09 ENCOUNTER — Other Ambulatory Visit (HOSPITAL_COMMUNITY): Payer: Self-pay

## 2023-06-09 DIAGNOSIS — Z96641 Presence of right artificial hip joint: Secondary | ICD-10-CM | POA: Diagnosis not present

## 2023-06-09 DIAGNOSIS — F419 Anxiety disorder, unspecified: Secondary | ICD-10-CM | POA: Diagnosis not present

## 2023-06-09 DIAGNOSIS — N951 Menopausal and female climacteric states: Secondary | ICD-10-CM | POA: Diagnosis not present

## 2023-06-09 DIAGNOSIS — N1831 Chronic kidney disease, stage 3a: Secondary | ICD-10-CM | POA: Diagnosis not present

## 2023-06-09 DIAGNOSIS — Z0000 Encounter for general adult medical examination without abnormal findings: Secondary | ICD-10-CM | POA: Diagnosis not present

## 2023-06-09 DIAGNOSIS — E559 Vitamin D deficiency, unspecified: Secondary | ICD-10-CM | POA: Diagnosis not present

## 2023-06-09 DIAGNOSIS — E782 Mixed hyperlipidemia: Secondary | ICD-10-CM | POA: Diagnosis not present

## 2023-06-09 DIAGNOSIS — E1169 Type 2 diabetes mellitus with other specified complication: Secondary | ICD-10-CM | POA: Diagnosis not present

## 2023-06-09 DIAGNOSIS — E039 Hypothyroidism, unspecified: Secondary | ICD-10-CM | POA: Diagnosis not present

## 2023-06-09 DIAGNOSIS — J309 Allergic rhinitis, unspecified: Secondary | ICD-10-CM | POA: Diagnosis not present

## 2023-06-09 MED ORDER — CLONAZEPAM 0.5 MG PO TABS
0.2500 mg | ORAL_TABLET | Freq: Every day | ORAL | 2 refills | Status: DC | PRN
  Filled 2023-06-09: qty 30, 30d supply, fill #0

## 2023-06-09 MED ORDER — SIMVASTATIN 40 MG PO TABS
40.0000 mg | ORAL_TABLET | Freq: Every day | ORAL | 1 refills | Status: DC
  Filled 2023-06-09: qty 90, 90d supply, fill #0
  Filled 2023-10-08: qty 90, 90d supply, fill #1

## 2023-06-09 MED ORDER — SERTRALINE HCL 100 MG PO TABS
100.0000 mg | ORAL_TABLET | Freq: Every day | ORAL | 1 refills | Status: DC
  Filled 2023-06-09: qty 90, 90d supply, fill #0
  Filled 2023-09-18 – 2023-09-19 (×2): qty 90, 90d supply, fill #1

## 2023-06-10 ENCOUNTER — Other Ambulatory Visit (HOSPITAL_COMMUNITY): Payer: Self-pay

## 2023-06-16 ENCOUNTER — Other Ambulatory Visit (HOSPITAL_COMMUNITY): Payer: Self-pay

## 2023-06-21 ENCOUNTER — Other Ambulatory Visit (HOSPITAL_COMMUNITY): Payer: Self-pay

## 2023-06-24 ENCOUNTER — Other Ambulatory Visit (HOSPITAL_COMMUNITY): Payer: Self-pay

## 2023-06-26 ENCOUNTER — Other Ambulatory Visit (HOSPITAL_COMMUNITY): Payer: Self-pay

## 2023-09-18 ENCOUNTER — Other Ambulatory Visit (HOSPITAL_COMMUNITY): Payer: Self-pay

## 2023-09-19 ENCOUNTER — Other Ambulatory Visit (HOSPITAL_COMMUNITY): Payer: Self-pay

## 2023-09-19 DIAGNOSIS — Z1231 Encounter for screening mammogram for malignant neoplasm of breast: Secondary | ICD-10-CM | POA: Diagnosis not present

## 2023-09-26 ENCOUNTER — Ambulatory Visit: Payer: 59 | Admitting: Internal Medicine

## 2023-10-30 ENCOUNTER — Inpatient Hospital Stay: Payer: 59 | Attending: Psychiatry | Admitting: Psychiatry

## 2023-10-30 ENCOUNTER — Encounter: Payer: Self-pay | Admitting: Psychiatry

## 2023-10-30 VITALS — BP 110/70 | HR 73 | Temp 97.9°F | Resp 19 | Wt 235.6 lb

## 2023-10-30 DIAGNOSIS — Z8542 Personal history of malignant neoplasm of other parts of uterus: Secondary | ICD-10-CM | POA: Insufficient documentation

## 2023-10-30 DIAGNOSIS — C541 Malignant neoplasm of endometrium: Secondary | ICD-10-CM

## 2023-10-30 DIAGNOSIS — Z9079 Acquired absence of other genital organ(s): Secondary | ICD-10-CM | POA: Insufficient documentation

## 2023-10-30 DIAGNOSIS — Z90710 Acquired absence of both cervix and uterus: Secondary | ICD-10-CM | POA: Insufficient documentation

## 2023-10-30 DIAGNOSIS — Z90722 Acquired absence of ovaries, bilateral: Secondary | ICD-10-CM | POA: Insufficient documentation

## 2023-10-30 NOTE — Patient Instructions (Signed)
 It was a pleasure to see you in clinic today. - Normal exam today - Return visit planned for 37mo  Thank you very much for allowing me to provide care for you today.  I appreciate your confidence in choosing our Gynecologic Oncology team at [facility].  If you have any questions about your visit today please call our office or send us  a MyChart message and we will get back to you as soon as possible.

## 2023-10-30 NOTE — Progress Notes (Signed)
 Gynecologic Oncology Return Clinic Visit  Date of Service: 10/30/2023 Referring Provider: Robbi Render, MD   Assessment & Plan: Whitney Davenport is a 58 y.o. woman with Stage IA1 FIGO grade 1 endometrioid endometrial cancer (no MI, no LVSI, MMRp, p53wt) s/p RA-TLH, BSO, blt SLNBx on 10/18/22 who presents for surveillance.  Endometrial cancer: - NED on exam today. - Signs/symptoms of recurrence reviewed. - Continue surveillance with follow-up q6 months x2 years then yearly.  - Reviewed that after 5 years NED, will be safe to return to Ob/Gyn  RTC 6 months.  Hoy Masters, MD Gynecologic Oncology   Medical Decision Making I personally spent  TOTAL 15 minutes face-to-face and non-face-to-face in the care of this patient, which includes all pre, intra, and post visit time on the date of service.  ----------------------- Reason for Visit: Surveillance  Treatment History: Oncology History  Endometrial cancer (HCC)  09/07/2022 Imaging   Pelvic ultrasound: Uterus measuring 8.3 x 5.1 x 4.3 cm. Endometrial stripe 11.6 mm No adnexal abnormalities   09/13/2022 Pathology Results   Endometrial biopsy: Well-differentiated endometrial adenocarcinoma, FIGO 1   09/13/2022 Initial Diagnosis   Endometrial cancer (HCC)   10/18/2022 Surgery   Robotic-assisted total laparoscopic hysterectomy, bilateral salpingo-oophorectomy, bilateral sentinel lymph node evaluation and biopsy    10/18/2022 Pathology Results   FINAL MICROSCOPIC DIAGNOSIS:  A. SENTINEL LYMPH NODE, RIGHT EXTERNAL ILIAC VEIN, EXCISION: -  1 lymph node negative for malignancy (0/1).  B. SENTINEL LYMPH NODE, LEFT OBTURATOR, EXCISION: -  1 lymph node negative for malignancy (0/1).  C. UTERUS, CERVIX, BILATERAL FALLOPIAN TUBES AND OVARIES: -  Endometrium with predominantly endometrial intraepithelial neoplasia with small foci approaching endometrioid carcinoma, FIGO grade 1 of 3 exclusively present in the endometrium without  evidence of myometrial invasion. -  Unremarkable cervix, negative for dysplasia. -  Myometrium with the presence of adenomyosis. -  Unremarkable bilateral fallopian tubes. -  Unremarkable bilateral senescent appearing ovaries. pT1a pN0; FIGO Stage 1A   10/18/2022 Cancer Staging   Staging form: Corpus Uteri - Carcinoma and Carcinosarcoma, AJCC 8th Edition - Pathologic stage from 10/18/2022: FIGO Stage IA (pT1a, pN0, cM0) - Signed by Masters Hoy, MD on 11/06/2022 Histopathologic type: Endometrioid adenocarcinoma, NOS Stage prefix: Initial diagnosis Histologic grade (G): G1 Histologic grading system: 3 grade system Lymph-vascular invasion (LVI): LVI not present (absent)/not identified     Interval History: Patient reports she is overall doing well.  Hot flashes a little bit better.  Not taking any medications or supplements for this. No new vaginal bleeding, abdominal/pelvic pain, unintentional weight loss, change in bowel or bladder habits, early satiety, bloating, nausea/vomiting.    Past Medical/Surgical History: Past Medical History:  Diagnosis Date   Allergy    Anemia    hx of   Anxiety    Back injury    ruptured disc-no problems since surgery   Back pain    Cancer (HCC)    endometrial cancer   Constipation    Depression    GERD (gastroesophageal reflux disease)    occ   Hepatitis 2004   hx of hep c-interferon treatment, does not show up on blood test anymore   History of kidney stones    Hyperlipidemia    Hypotension    no fainting in years   Hypothyroidism    not on meds yet, has appt at [facility]   Infertility, female    Lumbar disc disease    Osteoarthritis    Vitamin D  deficiency     Past  Surgical History:  Procedure Laterality Date   CERVICAL CONE BIOPSY  1987   DIAGNOSTIC LAPAROSCOPY  last 2001   for endometriosis, couple of surgeries   GANGLION CYST EXCISION Left age 15   left hand   LEEP  1998   LUMBAR LAMINECTOMY/DECOMPRESSION MICRODISCECTOMY   11/03/2011   Procedure: LUMBAR LAMINECTOMY/DECOMPRESSION MICRODISCECTOMY;  Surgeon: Lamar LELON Peaches, MD;  Location: [facility];  Service: Neurosurgery;  Laterality: Left;  LEFT Lumbar five sacral one laminotomy and microdiskectomy   ROBOTIC ASSISTED TOTAL HYSTERECTOMY WITH BILATERAL SALPINGO OOPHERECTOMY N/A 10/18/2022   Procedure: XI ROBOTIC ASSISTED TOTAL HYSTERECTOMY WITH BILATERAL SALPINGO OOPHORECTOMY;  Surgeon: Eldonna Mays, MD;  Location: [facility];  Service: Gynecology;  Laterality: N/A;   SENTINEL NODE BIOPSY N/A 10/18/2022   Procedure: SENTINEL NODE BIOPSY;  Surgeon: Eldonna Mays, MD;  Location: [facility];  Service: Gynecology;  Laterality: N/A;   TOTAL HIP ARTHROPLASTY Right 02/03/2015   Procedure: RIGHT TOTAL HIP ARTHROPLASTY ANTERIOR APPROACH;  Surgeon: Donnice Car, MD;  Location: [facility];  Service: Orthopedics;  Laterality: Right;    Family History  Problem Relation Age of Onset   Thyroid  disease Mother    Diabetes Father    Hyperlipidemia Father    Obesity Father    Lung cancer Maternal Grandmother    Colon cancer Neg Hx    Breast cancer Neg Hx    Ovarian cancer Neg Hx    Endometrial cancer Neg Hx    Prostate cancer Neg Hx    Pancreatic cancer Neg Hx     Social History   Socioeconomic History   Marital status: Married    Spouse name: Lynwood Rase   Number of children: 1   Years of education: Not on file   Highest education level: Not on file  Occupational History   Occupation: Patient Theatre Manager  Tobacco Use   Smoking status: Former    Current packs/day: 0.00    Types: Cigarettes    Start date: 11/01/1995    Quit date: 10/31/2010    Years since quitting: 13.0   Smokeless tobacco: Never  Vaping Use   Vaping status: Never Used  Substance and Sexual Activity   Alcohol use: Yes    Comment: rare   Drug use: No   Sexual activity: Not Currently    Partners: Male  Other Topics Concern   Not on file  Social History Narrative   Not on file   Social  Drivers of Health   Financial Resource Strain: Not on file  Food Insecurity: No Food Insecurity (10/05/2022)   Hunger Vital Sign    Worried About Running Out of Food in the Last Year: Never true    Ran Out of Food in the Last Year: Never true  Transportation Needs: No Transportation Needs (10/05/2022)   PRAPARE - Administrator, Civil Service (Medical): No    Lack of Transportation (Non-Medical): No  Physical Activity: Not on file  Stress: Not on file  Social Connections: Not on file    Current Medications:  Current Outpatient Medications:    cetirizine (ZYRTEC) 10 MG tablet, Take 10 mg by mouth at bedtime. , Disp: , Rfl:    clonazePAM  (KLONOPIN ) 0.5 MG tablet, Take 1/2-1 tablet (0.25-0.5 mg total) by mouth daily as needed., Disp: 30 tablet, Rfl: 2   sertraline  (ZOLOFT ) 100 MG tablet, Take 1 tablet (100 mg total) by mouth daily., Disp: 90 tablet, Rfl: 1   simvastatin  (ZOCOR ) 40 MG tablet, Take 1 tablet (40  mg total) by mouth daily., Disp: 90 tablet, Rfl: 1  Review of Symptoms: Complete 10-system review is positive for: Anxiety, hot flashes  Physical Exam: BP 110/70 (BP Location: Right Arm, Patient Position: Sitting)   Pulse 73   Temp 97.9 F (36.6 C) (Oral)   Resp 19   Wt 235 lb 9.6 oz (106.9 kg)   SpO2 97%   BMI 36.90 kg/m  General: Alert, oriented, no acute distress. HEENT: Normocephalic, atraumatic. Neck symmetric without masses. Sclera anicteric.  Chest: Normal work of breathing. Clear to auscultation bilaterally.   Cardiovascular: Regular rate and rhythm, no murmurs. Abdomen: Soft, nontender.  Normoactive bowel sounds.  No masses appreciated.  Well-healed incisions. Extremities: Grossly normal range of motion.  Warm, well perfused.  No edema bilaterally. Skin: No rashes or lesions noted. Lymphatics: No cervical, supraclavicular, or inguinal adenopathy. GU: Normal appearing external genitalia without erythema, excoriation, or lesions.  Speculum exam reveals  normal vaginal mucosa and cuff. No lesions.  Bimanual exam reveals smooth vaginal cuff. No pelvic mass or nodularity.  Exam chaperoned by Kimberly Jordan, CMA    Laboratory & Radiologic Studies: None

## 2023-12-14 ENCOUNTER — Other Ambulatory Visit (HOSPITAL_COMMUNITY): Payer: Self-pay

## 2023-12-14 ENCOUNTER — Other Ambulatory Visit: Payer: Self-pay

## 2023-12-14 DIAGNOSIS — J309 Allergic rhinitis, unspecified: Secondary | ICD-10-CM | POA: Diagnosis not present

## 2023-12-14 DIAGNOSIS — N951 Menopausal and female climacteric states: Secondary | ICD-10-CM | POA: Diagnosis not present

## 2023-12-14 DIAGNOSIS — E039 Hypothyroidism, unspecified: Secondary | ICD-10-CM | POA: Diagnosis not present

## 2023-12-14 DIAGNOSIS — Z8619 Personal history of other infectious and parasitic diseases: Secondary | ICD-10-CM | POA: Diagnosis not present

## 2023-12-14 DIAGNOSIS — Z96641 Presence of right artificial hip joint: Secondary | ICD-10-CM | POA: Diagnosis not present

## 2023-12-14 DIAGNOSIS — E559 Vitamin D deficiency, unspecified: Secondary | ICD-10-CM | POA: Diagnosis not present

## 2023-12-14 DIAGNOSIS — R7303 Prediabetes: Secondary | ICD-10-CM | POA: Diagnosis not present

## 2023-12-14 DIAGNOSIS — N1831 Chronic kidney disease, stage 3a: Secondary | ICD-10-CM | POA: Diagnosis not present

## 2023-12-14 DIAGNOSIS — F419 Anxiety disorder, unspecified: Secondary | ICD-10-CM | POA: Diagnosis not present

## 2023-12-14 DIAGNOSIS — E782 Mixed hyperlipidemia: Secondary | ICD-10-CM | POA: Diagnosis not present

## 2023-12-14 MED ORDER — SIMVASTATIN 40 MG PO TABS
40.0000 mg | ORAL_TABLET | Freq: Every day | ORAL | 1 refills | Status: DC
  Filled 2024-01-03: qty 90, 90d supply, fill #0
  Filled 2024-04-01: qty 90, 90d supply, fill #1

## 2023-12-14 MED ORDER — CLONAZEPAM 0.5 MG PO TABS
0.2500 mg | ORAL_TABLET | Freq: Every day | ORAL | 2 refills | Status: AC | PRN
  Filled 2023-12-14 (×2): qty 30, 30d supply, fill #0

## 2023-12-14 MED ORDER — SERTRALINE HCL 100 MG PO TABS
100.0000 mg | ORAL_TABLET | Freq: Every day | ORAL | 1 refills | Status: DC
  Filled 2023-12-14 (×2): qty 90, 90d supply, fill #0
  Filled 2024-03-14: qty 90, 90d supply, fill #1

## 2023-12-15 ENCOUNTER — Ambulatory Visit: Payer: 59 | Admitting: Internal Medicine

## 2024-01-03 ENCOUNTER — Other Ambulatory Visit: Payer: Self-pay

## 2024-01-03 ENCOUNTER — Other Ambulatory Visit (HOSPITAL_COMMUNITY): Payer: Self-pay

## 2024-03-07 DIAGNOSIS — H52223 Regular astigmatism, bilateral: Secondary | ICD-10-CM | POA: Diagnosis not present

## 2024-03-07 DIAGNOSIS — H5213 Myopia, bilateral: Secondary | ICD-10-CM | POA: Diagnosis not present

## 2024-03-07 DIAGNOSIS — H524 Presbyopia: Secondary | ICD-10-CM | POA: Diagnosis not present

## 2024-03-08 ENCOUNTER — Other Ambulatory Visit (HOSPITAL_COMMUNITY): Payer: Self-pay

## 2024-03-08 ENCOUNTER — Other Ambulatory Visit (HOSPITAL_BASED_OUTPATIENT_CLINIC_OR_DEPARTMENT_OTHER): Payer: Self-pay

## 2024-03-08 DIAGNOSIS — W57XXXA Bitten or stung by nonvenomous insect and other nonvenomous arthropods, initial encounter: Secondary | ICD-10-CM | POA: Diagnosis not present

## 2024-03-08 DIAGNOSIS — S70361A Insect bite (nonvenomous), right thigh, initial encounter: Secondary | ICD-10-CM | POA: Diagnosis not present

## 2024-03-08 MED ORDER — TRIAMCINOLONE ACETONIDE 0.1 % EX CREA
TOPICAL_CREAM | CUTANEOUS | 1 refills | Status: DC
  Filled 2024-03-08 (×2): qty 45, 30d supply, fill #0

## 2024-03-15 ENCOUNTER — Other Ambulatory Visit: Payer: Self-pay

## 2024-04-01 ENCOUNTER — Other Ambulatory Visit (HOSPITAL_COMMUNITY): Payer: Self-pay

## 2024-04-29 ENCOUNTER — Encounter: Payer: Self-pay | Admitting: Psychiatry

## 2024-04-29 ENCOUNTER — Inpatient Hospital Stay: Payer: 59 | Attending: Psychiatry | Admitting: Psychiatry

## 2024-04-29 ENCOUNTER — Other Ambulatory Visit (HOSPITAL_COMMUNITY)
Admission: RE | Admit: 2024-04-29 | Discharge: 2024-04-29 | Disposition: A | Discharge status: Home / Self Care | Source: Ambulatory Visit | Attending: Psychiatry | Admitting: Psychiatry

## 2024-04-29 VITALS — BP 139/65 | HR 65 | Temp 97.9°F | Resp 20 | Wt 239.2 lb

## 2024-04-29 DIAGNOSIS — Z90710 Acquired absence of both cervix and uterus: Secondary | ICD-10-CM | POA: Diagnosis not present

## 2024-04-29 DIAGNOSIS — Z8542 Personal history of malignant neoplasm of other parts of uterus: Secondary | ICD-10-CM | POA: Insufficient documentation

## 2024-04-29 DIAGNOSIS — C541 Malignant neoplasm of endometrium: Secondary | ICD-10-CM

## 2024-04-29 DIAGNOSIS — N898 Other specified noninflammatory disorders of vagina: Secondary | ICD-10-CM | POA: Diagnosis not present

## 2024-04-29 DIAGNOSIS — Z90722 Acquired absence of ovaries, bilateral: Secondary | ICD-10-CM | POA: Diagnosis not present

## 2024-04-29 NOTE — Progress Notes (Signed)
 Gynecologic Oncology Return Clinic Visit  Date of Service: 04/29/2024 Referring Provider: Robbi Render, MD   Assessment & Plan: Whitney Davenport is a 58 y.o. woman with Stage IA1 FIGO grade 1 endometrioid endometrial cancer (no MI, no LVSI, MMRp, p53wt) s/p RA-TLH, BSO, blt SLNBx on 10/18/22 who presents for surveillance.  Endometrial cancer: - NED on exam today. - Signs/symptoms of recurrence reviewed. - Continue surveillance with follow-up q6 months x2 years then yearly.  - Reviewed that after 5 years NED, will be safe to return to Ob/Gyn  Vaginal odor: - Vaginitis swab collected and sent. Will follow-up and treat prn  RTC 6 months.  Hoy Masters, MD Gynecologic Oncology   Medical Decision Making I personally spent  TOTAL 20 minutes face-to-face and non-face-to-face in the care of this patient, which includes all pre, intra, and post visit time on the date of service.  ----------------------- Reason for Visit: Surveillance  Treatment History: Oncology History  Endometrial cancer (HCC)  09/07/2022 Imaging   Pelvic ultrasound: Uterus measuring 8.3 x 5.1 x 4.3 cm. Endometrial stripe 11.6 mm No adnexal abnormalities   09/13/2022 Pathology Results   Endometrial biopsy: Well-differentiated endometrial adenocarcinoma, FIGO 1   09/13/2022 Initial Diagnosis   Endometrial cancer (HCC)   10/18/2022 Surgery   Robotic-assisted total laparoscopic hysterectomy, bilateral salpingo-oophorectomy, bilateral sentinel lymph node evaluation and biopsy    10/18/2022 Pathology Results   FINAL MICROSCOPIC DIAGNOSIS:  A. SENTINEL LYMPH NODE, RIGHT EXTERNAL ILIAC VEIN, EXCISION: -  1 lymph node negative for malignancy (0/1).  B. SENTINEL LYMPH NODE, LEFT OBTURATOR, EXCISION: -  1 lymph node negative for malignancy (0/1).  C. UTERUS, CERVIX, BILATERAL FALLOPIAN TUBES AND OVARIES: -  Endometrium with predominantly endometrial intraepithelial neoplasia with small foci approaching  endometrioid carcinoma, FIGO grade 1 of 3 exclusively present in the endometrium without evidence of myometrial invasion. -  Unremarkable cervix, negative for dysplasia. -  Myometrium with the presence of adenomyosis. -  Unremarkable bilateral fallopian tubes. -  Unremarkable bilateral senescent appearing ovaries. pT1a pN0; FIGO Stage 1A   10/18/2022 Cancer Staging   Staging form: Corpus Uteri - Carcinoma and Carcinosarcoma, AJCC 8th Edition - Pathologic stage from 10/18/2022: FIGO Stage IA (pT1a, pN0, cM0) - Signed by Masters Hoy, MD on 11/06/2022 Histopathologic type: Endometrioid adenocarcinoma, NOS Stage prefix: Initial diagnosis Histologic grade (G): G1 Histologic grading system: 3 grade system Lymph-vascular invasion (LVI): LVI not present (absent)/not identified     Interval History: Patient reports she is overall doing well.  Does note a vaginal odor that has not gone away but denies vaginal discharge.  She feels the odor is bothersome to her.  She otherwise denies any new vaginal bleeding, abdominal/pelvic pain, unintentional weight loss, change in bowel or bladder habits, early satiety, bloating, nausea/vomiting.    Past Medical/Surgical History: Past Medical History:  Diagnosis Date   Allergy    Anemia    hx of   Anxiety    Back injury    ruptured disc-no problems since surgery   Back pain    Cancer (HCC)    endometrial cancer   Constipation    Depression    GERD (gastroesophageal reflux disease)    occ   Hepatitis 2004   hx of hep c-interferon treatment, does not show up on blood test anymore   History of kidney stones    Hyperlipidemia    Hypotension    no fainting in years   Hypothyroidism    not on meds yet, has appt at  [facility]   Infertility, female    Lumbar disc disease    Osteoarthritis    Vitamin D  deficiency     Past Surgical History:  Procedure Laterality Date   CERVICAL CONE BIOPSY  1987   DIAGNOSTIC LAPAROSCOPY  last 2001   for  endometriosis, couple of surgeries   GANGLION CYST EXCISION Left age 35   left hand   LEEP  1998   LUMBAR LAMINECTOMY/DECOMPRESSION MICRODISCECTOMY  11/03/2011   Procedure: LUMBAR LAMINECTOMY/DECOMPRESSION MICRODISCECTOMY;  Surgeon: Lamar LELON Peaches, MD;  Location: [facility];  Service: Neurosurgery;  Laterality: Left;  LEFT Lumbar five sacral one laminotomy and microdiskectomy   ROBOTIC ASSISTED TOTAL HYSTERECTOMY WITH BILATERAL SALPINGO OOPHERECTOMY N/A 10/18/2022   Procedure: XI ROBOTIC ASSISTED TOTAL HYSTERECTOMY WITH BILATERAL SALPINGO OOPHORECTOMY;  Surgeon: Eldonna Mays, MD;  Location: [facility];  Service: Gynecology;  Laterality: N/A;   SENTINEL NODE BIOPSY N/A 10/18/2022   Procedure: SENTINEL NODE BIOPSY;  Surgeon: Eldonna Mays, MD;  Location: [facility];  Service: Gynecology;  Laterality: N/A;   TOTAL HIP ARTHROPLASTY Right 02/03/2015   Procedure: RIGHT TOTAL HIP ARTHROPLASTY ANTERIOR APPROACH;  Surgeon: Donnice Car, MD;  Location: [facility];  Service: Orthopedics;  Laterality: Right;    Family History  Problem Relation Age of Onset   Thyroid  disease Mother    Diabetes Father    Hyperlipidemia Father    Obesity Father    Lung cancer Maternal Grandmother    Colon cancer Neg Hx    Breast cancer Neg Hx    Ovarian cancer Neg Hx    Endometrial cancer Neg Hx    Prostate cancer Neg Hx    Pancreatic cancer Neg Hx     Social History   Socioeconomic History   Marital status: Married    Spouse name: Lynwood Rase   Number of children: 1   Years of education: Not on file   Highest education level: Not on file  Occupational History   Occupation: Patient Theatre manager  Tobacco Use   Smoking status: Former    Current packs/day: 0.00    Types: Cigarettes    Start date: 11/01/1995    Quit date: 10/31/2010    Years since quitting: 13.5   Smokeless tobacco: Never  Vaping Use   Vaping status: Never Used  Substance and Sexual Activity   Alcohol use: Yes    Comment: rare   Drug  use: No   Sexual activity: Not Currently    Partners: Male  Other Topics Concern   Not on file  Social History Narrative   Not on file   Social Drivers of Health   Financial Resource Strain: Not on file  Food Insecurity: No Food Insecurity (10/05/2022)   Hunger Vital Sign    Worried About Running Out of Food in the Last Year: Never true    Ran Out of Food in the Last Year: Never true  Transportation Needs: No Transportation Needs (10/05/2022)   PRAPARE - Administrator, Civil Service (Medical): No    Lack of Transportation (Non-Medical): No  Physical Activity: Not on file  Stress: Not on file  Social Connections: Not on file    Current Medications:  Current Outpatient Medications:    cetirizine (ZYRTEC) 10 MG tablet, Take 10 mg by mouth at bedtime. , Disp: , Rfl:    clonazePAM  (KLONOPIN ) 0.5 MG tablet, Take 1/2-1 tablet (0.25-0.5 mg total) by mouth daily as needed., Disp: 30 tablet, Rfl: 2   sertraline  (ZOLOFT ) 100 MG  tablet, Take 1 tablet (100 mg total) by mouth daily., Disp: 90 tablet, Rfl: 1   simvastatin  (ZOCOR ) 40 MG tablet, Take 1 tablet (40 mg total) by mouth daily., Disp: 90 tablet, Rfl: 1   triamcinolone  cream (KENALOG ) 0.1 %, Apply 1 application externally to affected areas 1-2 times daily 30 days, Disp: 45 g, Rfl: 1   clonazePAM  (KLONOPIN ) 0.5 MG tablet, Take 0.5-1 tablets (0.25-0.5 mg total) by mouth daily as needed., Disp: 30 tablet, Rfl: 2  Review of Symptoms: Complete 10-system review is positive for: none  Physical Exam: BP 139/65 (BP Location: Left Arm, Patient Position: Sitting)   Pulse 65   Temp 97.9 F (36.6 C) (Oral)   Resp 20   Wt 239 lb 3.2 oz (108.5 kg)   SpO2 99%   BMI 37.46 kg/m  General: Alert, oriented, no acute distress. HEENT: Normocephalic, atraumatic. Neck symmetric without masses. Sclera anicteric.  Chest: Normal work of breathing. Clear to auscultation bilaterally.   Cardiovascular: Regular rate and rhythm, no  murmurs. Abdomen: Soft, nontender.  Normoactive bowel sounds.  No masses appreciated.  Well-healed incisions. Extremities: Grossly normal range of motion.  Warm, well perfused.  No edema bilaterally. Skin: No rashes or lesions noted. Lymphatics: No cervical, supraclavicular, or inguinal adenopathy. GU: Normal appearing external genitalia without erythema, excoriation, or lesions.  Speculum exam reveals normal vaginal mucosa and cuff. No lesions.  Bimanual exam reveals smooth vaginal cuff. No pelvic mass or nodularity.  Exam chaperoned by April NT    Laboratory & Radiologic Studies: None

## 2024-04-29 NOTE — Patient Instructions (Addendum)
 Plan on follow up in six months.   We will contact you with the results of the swab taken today.  Please call for any needs.  Symptoms to report to your health care team include vaginal bleeding, rectal bleeding, bloating, weight loss without effort, new and persistent pain, new and  persistent fatigue, new leg swelling, new masses (i.e., bumps in your neck or groin), new and persistent cough, new and persistent nausea and vomiting, change in bowel or bladder habits, and any other concerns.

## 2024-05-01 LAB — CERVICOVAGINAL ANCILLARY ONLY
Bacterial Vaginitis (gardnerella): NEGATIVE
Candida Glabrata: NEGATIVE
Candida Vaginitis: NEGATIVE
Comment: NEGATIVE
Comment: NEGATIVE
Comment: NEGATIVE
Comment: NEGATIVE
Trichomonas: NEGATIVE

## 2024-05-06 ENCOUNTER — Ambulatory Visit: Payer: Self-pay | Admitting: Psychiatry

## 2024-06-09 ENCOUNTER — Other Ambulatory Visit (HOSPITAL_COMMUNITY): Payer: Self-pay

## 2024-06-11 ENCOUNTER — Other Ambulatory Visit: Payer: Self-pay

## 2024-06-11 ENCOUNTER — Other Ambulatory Visit (HOSPITAL_COMMUNITY): Payer: Self-pay

## 2024-06-11 MED ORDER — SERTRALINE HCL 100 MG PO TABS
100.0000 mg | ORAL_TABLET | Freq: Every day | ORAL | 0 refills | Status: DC
  Filled 2024-06-11: qty 90, 90d supply, fill #0

## 2024-07-01 ENCOUNTER — Other Ambulatory Visit (HOSPITAL_COMMUNITY): Payer: Self-pay

## 2024-07-03 ENCOUNTER — Other Ambulatory Visit: Payer: Self-pay

## 2024-07-03 ENCOUNTER — Other Ambulatory Visit (HOSPITAL_COMMUNITY): Payer: Self-pay

## 2024-07-03 MED ORDER — SIMVASTATIN 40 MG PO TABS
40.0000 mg | ORAL_TABLET | Freq: Every day | ORAL | 1 refills | Status: AC
  Filled 2024-07-03: qty 90, 90d supply, fill #0
  Filled 2024-10-05 (×2): qty 90, 90d supply, fill #1

## 2024-07-22 ENCOUNTER — Other Ambulatory Visit (HOSPITAL_COMMUNITY): Payer: Self-pay

## 2024-07-22 ENCOUNTER — Other Ambulatory Visit: Payer: Self-pay

## 2024-07-22 DIAGNOSIS — N951 Menopausal and female climacteric states: Secondary | ICD-10-CM | POA: Diagnosis not present

## 2024-07-22 DIAGNOSIS — J309 Allergic rhinitis, unspecified: Secondary | ICD-10-CM | POA: Diagnosis not present

## 2024-07-22 DIAGNOSIS — N1831 Chronic kidney disease, stage 3a: Secondary | ICD-10-CM | POA: Diagnosis not present

## 2024-07-22 DIAGNOSIS — E039 Hypothyroidism, unspecified: Secondary | ICD-10-CM | POA: Diagnosis not present

## 2024-07-22 DIAGNOSIS — E559 Vitamin D deficiency, unspecified: Secondary | ICD-10-CM | POA: Diagnosis not present

## 2024-07-22 DIAGNOSIS — Z8619 Personal history of other infectious and parasitic diseases: Secondary | ICD-10-CM | POA: Diagnosis not present

## 2024-07-22 DIAGNOSIS — E782 Mixed hyperlipidemia: Secondary | ICD-10-CM | POA: Diagnosis not present

## 2024-07-22 DIAGNOSIS — E1169 Type 2 diabetes mellitus with other specified complication: Secondary | ICD-10-CM | POA: Diagnosis not present

## 2024-07-22 DIAGNOSIS — F419 Anxiety disorder, unspecified: Secondary | ICD-10-CM | POA: Diagnosis not present

## 2024-07-22 DIAGNOSIS — Z0000 Encounter for general adult medical examination without abnormal findings: Secondary | ICD-10-CM | POA: Diagnosis not present

## 2024-07-22 MED ORDER — SIMVASTATIN 40 MG PO TABS: 40.0000 mg | ORAL_TABLET | Freq: Every evening | ORAL | 1 refills | Status: DC

## 2024-07-22 MED ORDER — SERTRALINE HCL 100 MG PO TABS
100.0000 mg | ORAL_TABLET | Freq: Every day | ORAL | 1 refills | Status: AC
  Filled 2024-09-16: qty 90, 90d supply, fill #0

## 2024-07-22 MED ORDER — CLONAZEPAM 0.5 MG PO TABS
0.2500 mg | ORAL_TABLET | Freq: Every day | ORAL | 2 refills | Status: DC | PRN
  Filled 2024-07-22: qty 30, 30d supply, fill #0

## 2024-07-23 ENCOUNTER — Other Ambulatory Visit (HOSPITAL_COMMUNITY): Payer: Self-pay

## 2024-09-16 ENCOUNTER — Other Ambulatory Visit (HOSPITAL_BASED_OUTPATIENT_CLINIC_OR_DEPARTMENT_OTHER): Payer: Self-pay

## 2024-10-05 ENCOUNTER — Other Ambulatory Visit (HOSPITAL_COMMUNITY): Payer: Self-pay

## 2024-11-04 ENCOUNTER — Inpatient Hospital Stay: Attending: Psychiatry | Admitting: Psychiatry

## 2024-11-04 ENCOUNTER — Encounter: Payer: Self-pay | Admitting: Psychiatry

## 2024-11-04 VITALS — BP 123/66 | HR 82 | Temp 98.3°F | Resp 19 | Wt 228.6 lb

## 2024-11-04 DIAGNOSIS — Z90722 Acquired absence of ovaries, bilateral: Secondary | ICD-10-CM | POA: Insufficient documentation

## 2024-11-04 DIAGNOSIS — Z8542 Personal history of malignant neoplasm of other parts of uterus: Secondary | ICD-10-CM | POA: Diagnosis not present

## 2024-11-04 DIAGNOSIS — Z90710 Acquired absence of both cervix and uterus: Secondary | ICD-10-CM | POA: Diagnosis not present

## 2024-11-04 DIAGNOSIS — Z9079 Acquired absence of other genital organ(s): Secondary | ICD-10-CM | POA: Insufficient documentation

## 2024-11-04 DIAGNOSIS — C541 Malignant neoplasm of endometrium: Secondary | ICD-10-CM

## 2024-11-04 NOTE — Patient Instructions (Signed)
 It was a pleasure to see you in clinic today. - Normal exam today - Return visit planned for 1 year.   Thank you very much for allowing me to provide care for you today.  I appreciate your confidence in choosing our Gynecologic Oncology team at [facility].  If you have any questions about your visit today please call our office or send us  a MyChart message and we will get back to you as soon as possible.

## 2024-11-04 NOTE — Progress Notes (Signed)
 Gynecologic Oncology Return Clinic Visit  Date of Service: 11/04/2024 Referring Provider: Robbi Render, MD   Assessment & Plan: Whitney Davenport is a 59 y.o. woman with Stage IA1 FIGO grade 1 endometrioid endometrial cancer (no MI, no LVSI, MMRp, p53wt) s/p RA-TLH, BSO, blt SLNBx on 10/18/22 who presents for surveillance.  Endometrial cancer: - NED on exam today. - Signs/symptoms of recurrence reviewed. - Continue surveillance with follow-up yearly given that she is now 2 years NED. - Reviewed that after 5 years NED, will be safe to return to Ob/Gyn   RTC 1 year  Hoy Masters, MD Gynecologic Oncology   Medical Decision Making I personally spent  TOTAL 15 minutes face-to-face and non-face-to-face in the care of this patient, which includes all pre, intra, and post visit time on the date of service.  ----------------------- Reason for Visit: Surveillance  Treatment History: Oncology History  Endometrial cancer (HCC)  09/07/2022 Imaging   Pelvic ultrasound: Uterus measuring 8.3 x 5.1 x 4.3 cm. Endometrial stripe 11.6 mm No adnexal abnormalities   09/13/2022 Pathology Results   Endometrial biopsy: Well-differentiated endometrial adenocarcinoma, FIGO 1   09/13/2022 Initial Diagnosis   Endometrial cancer (HCC)   10/18/2022 Surgery   Robotic-assisted total laparoscopic hysterectomy, bilateral salpingo-oophorectomy, bilateral sentinel lymph node evaluation and biopsy    10/18/2022 Pathology Results   FINAL MICROSCOPIC DIAGNOSIS:  A. SENTINEL LYMPH NODE, RIGHT EXTERNAL ILIAC VEIN, EXCISION: -  1 lymph node negative for malignancy (0/1).  B. SENTINEL LYMPH NODE, LEFT OBTURATOR, EXCISION: -  1 lymph node negative for malignancy (0/1).  C. UTERUS, CERVIX, BILATERAL FALLOPIAN TUBES AND OVARIES: -  Endometrium with predominantly endometrial intraepithelial neoplasia with small foci approaching endometrioid carcinoma, FIGO grade 1 of 3 exclusively present in the endometrium  without evidence of myometrial invasion. -  Unremarkable cervix, negative for dysplasia. -  Myometrium with the presence of adenomyosis. -  Unremarkable bilateral fallopian tubes. -  Unremarkable bilateral senescent appearing ovaries. pT1a pN0; FIGO Stage 1A   10/18/2022 Cancer Staging   Staging form: Corpus Uteri - Carcinoma and Carcinosarcoma, AJCC 8th Edition - Pathologic stage from 10/18/2022: FIGO Stage IA (pT1a, pN0, cM0) - Signed by Masters Hoy, MD on 11/06/2022 Histopathologic type: Endometrioid adenocarcinoma, NOS Stage prefix: Initial diagnosis Histologic grade (G): G1 Histologic grading system: 3 grade system Lymph-vascular invasion (LVI): LVI not present (absent)/not identified     Interval History: Patient reports that she is overall doing well. She otherwise denies any new vaginal bleeding, abdominal/pelvic pain, unintentional weight loss, change in bowel or bladder habits, early satiety, bloating, nausea/vomiting.    Past Medical/Surgical History: Past Medical History:  Diagnosis Date   Allergy    Anemia    hx of   Anxiety    Back injury    ruptured disc-no problems since surgery   Back pain    Cancer (HCC)    endometrial cancer   Constipation    Depression    GERD (gastroesophageal reflux disease)    occ   Hepatitis 2004   hx of hep c-interferon treatment, does not show up on blood test anymore   History of kidney stones    Hyperlipidemia    Hypotension    no fainting in years   Hypothyroidism    not on meds yet, has appt at [facility]   Infertility, female    Lumbar disc disease    Osteoarthritis    Vitamin D  deficiency     Past Surgical History:  Procedure Laterality Date   CERVICAL  CONE BIOPSY  1987   DIAGNOSTIC LAPAROSCOPY  last 2001   for endometriosis, couple of surgeries   GANGLION CYST EXCISION Left age 33   left hand   LEEP  1998   LUMBAR LAMINECTOMY/DECOMPRESSION MICRODISCECTOMY  11/03/2011   Procedure: LUMBAR  LAMINECTOMY/DECOMPRESSION MICRODISCECTOMY;  Surgeon: Lamar LELON Peaches, MD;  Location: [facility];  Service: Neurosurgery;  Laterality: Left;  LEFT Lumbar five sacral one laminotomy and microdiskectomy   ROBOTIC ASSISTED TOTAL HYSTERECTOMY WITH BILATERAL SALPINGO OOPHERECTOMY N/A 10/18/2022   Procedure: XI ROBOTIC ASSISTED TOTAL HYSTERECTOMY WITH BILATERAL SALPINGO OOPHORECTOMY;  Surgeon: Eldonna Mays, MD;  Location: [facility];  Service: Gynecology;  Laterality: N/A;   SENTINEL NODE BIOPSY N/A 10/18/2022   Procedure: SENTINEL NODE BIOPSY;  Surgeon: Eldonna Mays, MD;  Location: [facility];  Service: Gynecology;  Laterality: N/A;   TOTAL HIP ARTHROPLASTY Right 02/03/2015   Procedure: RIGHT TOTAL HIP ARTHROPLASTY ANTERIOR APPROACH;  Surgeon: Donnice Car, MD;  Location: [facility];  Service: Orthopedics;  Laterality: Right;    Family History  Problem Relation Age of Onset   Thyroid  disease Mother    Diabetes Father    Hyperlipidemia Father    Obesity Father    Lung cancer Maternal Grandmother    Colon cancer Neg Hx    Breast cancer Neg Hx    Ovarian cancer Neg Hx    Endometrial cancer Neg Hx    Prostate cancer Neg Hx    Pancreatic cancer Neg Hx     Social History   Socioeconomic History   Marital status: Married    Spouse name: Lynwood Rase   Number of children: 1   Years of education: Not on file   Highest education level: Not on file  Occupational History   Occupation: Patient Theatre Manager  Tobacco Use   Smoking status: Former    Current packs/day: 0.00    Types: Cigarettes    Start date: 11/01/1995    Quit date: 10/31/2010    Years since quitting: 14.0   Smokeless tobacco: Never  Vaping Use   Vaping status: Never Used  Substance and Sexual Activity   Alcohol use: Yes    Comment: rare   Drug use: No   Sexual activity: Not Currently    Partners: Male  Other Topics Concern   Not on file  Social History Narrative   Not on file   Social Drivers of Health   Tobacco Use:  Medium Risk (04/29/2024)   Patient History    Smoking Tobacco Use: Former    Smokeless Tobacco Use: Never    Passive Exposure: Not on Actuary Strain: Not on file  Food Insecurity: No Food Insecurity (10/30/2024)   Epic    Worried About Programme Researcher, Broadcasting/film/video in the Last Year: Never true    Ran Out of Food in the Last Year: Never true  Transportation Needs: No Transportation Needs (10/30/2024)   Epic    Lack of Transportation (Medical): No    Lack of Transportation (Non-Medical): No  Physical Activity: Not on file  Stress: Not on file  Social Connections: Not on file  Depression (PHQ2-9): Low Risk (10/30/2024)   Depression (PHQ2-9)    PHQ-2 Score: 0  Alcohol Screen: Not on file  Housing: Unknown (10/30/2024)   Epic    Unable to Pay for Housing in the Last Year: No    Number of Times Moved in the Last Year: Not on file    Homeless in the Last Year: No  Utilities: Not At Risk (10/30/2024)   Epic    Threatened with loss of utilities: No  Health Literacy: Not on file    Current Medications:  Current Outpatient Medications:    cetirizine (ZYRTEC) 10 MG tablet, Take 10 mg by mouth at bedtime. , Disp: , Rfl:    Cholecalciferol 50 MCG (2000 UT) CAPS, 1 capsule., Disp: , Rfl:    clonazePAM  (KLONOPIN ) 0.5 MG tablet, Take 0.5-1 tablets (0.25-0.5 mg total) by mouth daily as needed., Disp: 30 tablet, Rfl: 2   sertraline  (ZOLOFT ) 100 MG tablet, Take 1 tablet (100 mg total) by mouth daily., Disp: 90 tablet, Rfl: 1   simvastatin  (ZOCOR ) 40 MG tablet, Take 1 tablet (40 mg total) by mouth daily., Disp: 90 tablet, Rfl: 1  Review of Symptoms: Complete 10-system review is positive for: Cough  Physical Exam: BP 123/66 (BP Location: Right Arm, Patient Position: Sitting)   Pulse 82   Temp 98.3 F (36.8 C) (Oral)   Resp 19   Wt 228 lb 9.6 oz (103.7 kg)   SpO2 99%   BMI 35.80 kg/m  General: Alert, oriented, no acute distress. HEENT: Normocephalic, atraumatic. Neck symmetric  without masses. Sclera anicteric.  Chest: Normal work of breathing. Clear to auscultation bilaterally.   Cardiovascular: Regular rate and rhythm, no murmurs. Abdomen: Soft, nontender.  Normoactive bowel sounds.  No masses appreciated.  Well-healed incisions. Extremities: Grossly normal range of motion.  Warm, well perfused.  No edema bilaterally. Skin: No rashes or lesions noted. Lymphatics: No cervical, supraclavicular, or inguinal adenopathy. GU: Normal appearing external genitalia without erythema, excoriation, or lesions.  Speculum exam reveals normal vaginal mucosa and cuff. No lesions.  Bimanual exam reveals smooth vaginal cuff. No pelvic mass or nodularity.  Exam chaperoned by Kimberly Jordan, CMA   Laboratory & Radiologic Studies: None
